# Patient Record
Sex: Male | Born: 1945 | Race: Black or African American | Hispanic: No | Marital: Married | Smoking: Current every day smoker
Health system: Southern US, Community
[De-identification: ages and names within clinical notes are randomized; demographics above are authoritative.]

## PROBLEM LIST (undated history)

## (undated) DIAGNOSIS — F419 Anxiety disorder, unspecified: Secondary | ICD-10-CM

## (undated) DIAGNOSIS — C9110 Chronic lymphocytic leukemia of B-cell type not having achieved remission: Secondary | ICD-10-CM

## (undated) DIAGNOSIS — I10 Essential (primary) hypertension: Secondary | ICD-10-CM

## (undated) DIAGNOSIS — J449 Chronic obstructive pulmonary disease, unspecified: Secondary | ICD-10-CM

## (undated) HISTORY — DX: Anxiety disorder, unspecified: F41.9

## (undated) HISTORY — DX: Chronic obstructive pulmonary disease, unspecified: J44.9

## (undated) HISTORY — DX: Essential (primary) hypertension: I10

## (undated) HISTORY — PX: HERNIA REPAIR: SHX51

---

## 2004-08-29 ENCOUNTER — Ambulatory Visit: Payer: Self-pay | Admitting: Internal Medicine

## 2006-04-30 ENCOUNTER — Ambulatory Visit: Payer: Self-pay | Admitting: General Surgery

## 2008-05-27 ENCOUNTER — Emergency Department: Payer: Self-pay | Admitting: Emergency Medicine

## 2009-07-15 ENCOUNTER — Ambulatory Visit: Payer: Self-pay | Admitting: Oncology

## 2009-08-01 ENCOUNTER — Ambulatory Visit: Payer: Self-pay | Admitting: Oncology

## 2009-08-15 ENCOUNTER — Ambulatory Visit: Payer: Self-pay | Admitting: Oncology

## 2009-09-12 ENCOUNTER — Ambulatory Visit: Payer: Self-pay | Admitting: Oncology

## 2009-10-13 ENCOUNTER — Ambulatory Visit: Payer: Self-pay | Admitting: Oncology

## 2009-10-25 ENCOUNTER — Ambulatory Visit: Payer: Self-pay | Admitting: Oncology

## 2009-11-09 ENCOUNTER — Ambulatory Visit: Payer: Self-pay | Admitting: Oncology

## 2009-11-12 ENCOUNTER — Ambulatory Visit: Payer: Self-pay | Admitting: Oncology

## 2010-01-12 ENCOUNTER — Ambulatory Visit: Payer: Self-pay | Admitting: Oncology

## 2010-01-31 ENCOUNTER — Ambulatory Visit: Payer: Self-pay | Admitting: Oncology

## 2010-02-12 ENCOUNTER — Ambulatory Visit: Payer: Self-pay | Admitting: Oncology

## 2010-05-02 ENCOUNTER — Ambulatory Visit: Payer: Self-pay | Admitting: Oncology

## 2010-05-15 ENCOUNTER — Ambulatory Visit: Payer: Self-pay | Admitting: Oncology

## 2010-08-01 ENCOUNTER — Ambulatory Visit: Payer: Self-pay | Admitting: Oncology

## 2010-08-15 ENCOUNTER — Ambulatory Visit: Payer: Self-pay | Admitting: Oncology

## 2010-10-31 ENCOUNTER — Ambulatory Visit: Payer: Self-pay | Admitting: Oncology

## 2010-11-13 ENCOUNTER — Ambulatory Visit: Payer: Self-pay | Admitting: Oncology

## 2010-12-14 ENCOUNTER — Ambulatory Visit: Payer: Self-pay | Admitting: Oncology

## 2011-01-30 ENCOUNTER — Ambulatory Visit: Payer: Self-pay | Admitting: Oncology

## 2011-02-13 ENCOUNTER — Ambulatory Visit: Payer: Self-pay | Admitting: Oncology

## 2011-05-21 ENCOUNTER — Ambulatory Visit: Payer: Self-pay | Admitting: Oncology

## 2011-06-15 ENCOUNTER — Ambulatory Visit: Payer: Self-pay | Admitting: Oncology

## 2011-08-20 ENCOUNTER — Ambulatory Visit: Payer: Self-pay | Admitting: Oncology

## 2011-08-20 LAB — CBC CANCER CENTER
Basophil #: 0.9 x10 3/mm — ABNORMAL HIGH (ref 0.0–0.1)
Eosinophil #: 0.3 x10 3/mm (ref 0.0–0.7)
HCT: 37.6 % — ABNORMAL LOW (ref 40.0–52.0)
HGB: 12.6 g/dL — ABNORMAL LOW (ref 13.0–18.0)
Lymphocyte #: 68.5 x10 3/mm — ABNORMAL HIGH (ref 1.0–3.6)
Lymphocyte %: 77.7 %
Lymphocytes: 84 %
MCHC: 33.6 g/dL (ref 32.0–36.0)
MCV: 90 fL (ref 80–100)
Monocyte %: 8.7 %
Monocytes: 4 %
Neutrophil #: 10.7 x10 3/mm — ABNORMAL HIGH (ref 1.4–6.5)
Neutrophil %: 12.2 %
Platelet: 126 x10 3/mm — ABNORMAL LOW (ref 150–440)
RBC: 4.18 10*6/uL — ABNORMAL LOW (ref 4.40–5.90)
Segmented Neutrophils: 12 %

## 2011-09-13 ENCOUNTER — Ambulatory Visit: Payer: Self-pay | Admitting: Oncology

## 2011-11-25 ENCOUNTER — Ambulatory Visit: Payer: Self-pay | Admitting: Oncology

## 2011-11-25 LAB — CBC CANCER CENTER
Basophil #: 0.2 x10 3/mm — ABNORMAL HIGH (ref 0.0–0.1)
Basophil %: 0.2 %
Eosinophil %: 0.3 %
MCH: 29.3 pg (ref 26.0–34.0)
MCHC: 32.2 g/dL (ref 32.0–36.0)
Monocyte #: 1.8 x10 3/mm — ABNORMAL HIGH (ref 0.2–1.0)
Neutrophil #: 8.8 x10 3/mm — ABNORMAL HIGH (ref 1.4–6.5)
Platelet: 98 x10 3/mm — ABNORMAL LOW (ref 150–440)
RBC: 4.36 10*6/uL — ABNORMAL LOW (ref 4.40–5.90)
WBC: 79 x10 3/mm — ABNORMAL HIGH (ref 3.8–10.6)

## 2011-12-14 ENCOUNTER — Ambulatory Visit: Payer: Self-pay | Admitting: Oncology

## 2012-01-06 LAB — CBC CANCER CENTER
Basophil #: 0.4 x10 3/mm — ABNORMAL HIGH (ref 0.0–0.1)
Basophil %: 0.5 %
Eosinophil #: 0.2 x10 3/mm (ref 0.0–0.7)
Eosinophil %: 0.4 %
HCT: 37.7 % — ABNORMAL LOW (ref 40.0–52.0)
HGB: 12.2 g/dL — ABNORMAL LOW (ref 13.0–18.0)
Lymphocyte #: 57.2 x10 3/mm — ABNORMAL HIGH (ref 1.0–3.6)
Lymphocyte %: 84.9 %
MCH: 29.2 pg (ref 26.0–34.0)
MCHC: 32.2 g/dL (ref 32.0–36.0)
MCV: 91 fL (ref 80–100)
Monocyte #: 1.4 x10 3/mm — ABNORMAL HIGH (ref 0.2–1.0)
Monocyte %: 2.1 %
Neutrophil #: 8.1 x10 3/mm — ABNORMAL HIGH (ref 1.4–6.5)
Neutrophil %: 12.1 %
Platelet: 101 x10 3/mm — ABNORMAL LOW (ref 150–440)
RBC: 4.15 10*6/uL — ABNORMAL LOW (ref 4.40–5.90)
RDW: 15 % — ABNORMAL HIGH (ref 11.5–14.5)
WBC: 67.3 x10 3/mm — ABNORMAL HIGH (ref 3.8–10.6)

## 2012-01-13 ENCOUNTER — Ambulatory Visit: Payer: Self-pay | Admitting: Oncology

## 2012-02-17 ENCOUNTER — Ambulatory Visit: Payer: Self-pay | Admitting: Oncology

## 2012-02-17 LAB — CBC CANCER CENTER
Basophil #: 0.2 x10 3/mm — ABNORMAL HIGH (ref 0.0–0.1)
Eosinophil #: 0.2 x10 3/mm (ref 0.0–0.7)
HCT: 38.7 % — ABNORMAL LOW (ref 40.0–52.0)
HGB: 12.8 g/dL — ABNORMAL LOW (ref 13.0–18.0)
Lymphocyte %: 88.4 %
MCH: 30.3 pg (ref 26.0–34.0)
MCHC: 33.1 g/dL (ref 32.0–36.0)
Monocyte #: 1.6 x10 3/mm — ABNORMAL HIGH (ref 0.2–1.0)
Neutrophil #: 7.7 x10 3/mm — ABNORMAL HIGH (ref 1.4–6.5)
Neutrophil %: 9.2 %
RDW: 15.1 % — ABNORMAL HIGH (ref 11.5–14.5)

## 2012-03-15 ENCOUNTER — Ambulatory Visit: Payer: Self-pay | Admitting: Oncology

## 2012-03-30 LAB — CBC CANCER CENTER
Basophil #: 0.4 x10 3/mm — ABNORMAL HIGH (ref 0.0–0.1)
Basophil %: 0.4 %
Eosinophil #: 0.2 x10 3/mm (ref 0.0–0.7)
Eosinophil %: 0.3 %
HCT: 37.3 % — ABNORMAL LOW (ref 40.0–52.0)
HGB: 11.9 g/dL — ABNORMAL LOW (ref 13.0–18.0)
Lymphocyte %: 88.1 %
Lymphs Abs: 74.8 x10 3/mm — ABNORMAL HIGH (ref 1.0–3.6)
MCH: 29.4 pg (ref 26.0–34.0)
MCHC: 31.9 g/dL — ABNORMAL LOW (ref 32.0–36.0)
MCV: 92 fL (ref 80–100)
Monocyte #: 1.1 x10 3/mm — ABNORMAL HIGH (ref 0.2–1.0)
Monocyte %: 1.3 %
Neutrophil #: 8.4 x10 3/mm — ABNORMAL HIGH (ref 1.4–6.5)
Neutrophil %: 9.9 %
Platelet: 96 x10 3/mm — ABNORMAL LOW (ref 150–440)
RBC: 4.06 x10 6/mm — ABNORMAL LOW (ref 4.40–5.90)
RDW: 14.8 % — ABNORMAL HIGH (ref 11.5–14.5)
WBC: 85 x10 3/mm — ABNORMAL HIGH (ref 3.8–10.6)

## 2012-04-07 LAB — CBC CANCER CENTER
Basophil #: 0.1 x10 3/mm (ref 0.0–0.1)
Basophil %: 0.2 %
HCT: 39.5 % — ABNORMAL LOW (ref 40.0–52.0)
HGB: 12.3 g/dL — ABNORMAL LOW (ref 13.0–18.0)
Lymphocyte %: 86.9 %
MCHC: 31.2 g/dL — ABNORMAL LOW (ref 32.0–36.0)
Monocyte %: 1.6 %
Neutrophil #: 8.8 x10 3/mm — ABNORMAL HIGH (ref 1.4–6.5)
Neutrophil %: 11 %
RBC: 4.21 10*6/uL — ABNORMAL LOW (ref 4.40–5.90)
RDW: 14.9 % — ABNORMAL HIGH (ref 11.5–14.5)
WBC: 79.7 x10 3/mm — ABNORMAL HIGH (ref 3.8–10.6)

## 2012-04-14 ENCOUNTER — Ambulatory Visit: Payer: Self-pay | Admitting: Oncology

## 2012-04-14 LAB — CBC CANCER CENTER
HGB: 12.7 g/dL — ABNORMAL LOW (ref 13.0–18.0)
Lymphocytes: 89 %
MCHC: 30.9 g/dL — ABNORMAL LOW (ref 32.0–36.0)
Monocytes: 2 %
Platelet: 110 x10 3/mm — ABNORMAL LOW (ref 150–440)
RDW: 15 % — ABNORMAL HIGH (ref 11.5–14.5)
Segmented Neutrophils: 9 %

## 2012-04-14 LAB — BASIC METABOLIC PANEL
Calcium, Total: 9.3 mg/dL (ref 8.5–10.1)
Co2: 26 mmol/L (ref 21–32)
Sodium: 142 mmol/L (ref 136–145)

## 2012-04-21 LAB — BASIC METABOLIC PANEL
Anion Gap: 11 (ref 7–16)
Calcium, Total: 9.5 mg/dL (ref 8.5–10.1)
Co2: 27 mmol/L (ref 21–32)
Creatinine: 1.04 mg/dL (ref 0.60–1.30)
EGFR (African American): >60
EGFR (Non-African Amer.): >60
Glucose: 76 mg/dL (ref 65–99)
Osmolality: 279 (ref 275–301)
Sodium: 141 mmol/L (ref 136–145)

## 2012-04-21 LAB — CBC CANCER CENTER
Basophil #: 0.1 x10 3/mm (ref 0.0–0.1)
Basophil %: 0.1 %
Eosinophil #: 0.2 x10 3/mm (ref 0.0–0.7)
HCT: 41.8 % (ref 40.0–52.0)
HGB: 13.3 g/dL (ref 13.0–18.0)
Lymphocyte %: 78.2 %
MCH: 29.1 pg (ref 26.0–34.0)
MCHC: 31.7 g/dL — ABNORMAL LOW (ref 32.0–36.0)
Monocyte %: 2.5 %
Neutrophil %: 18.9 %
RDW: 15 % — ABNORMAL HIGH (ref 11.5–14.5)

## 2012-04-28 LAB — BASIC METABOLIC PANEL
Anion Gap: 12 (ref 7–16)
BUN: 10 mg/dL (ref 7–18)
Creatinine: 1.04 mg/dL (ref 0.60–1.30)
EGFR (Non-African Amer.): >60
Glucose: 78 mg/dL (ref 65–99)
Sodium: 141 mmol/L (ref 136–145)

## 2012-04-28 LAB — CBC CANCER CENTER
Eosinophil %: 0.7 %
HCT: 42.5 % (ref 40.0–52.0)
HGB: 13.7 g/dL (ref 13.0–18.0)
Lymphocyte #: 23.4 x10 3/mm — ABNORMAL HIGH (ref 1.0–3.6)
MCHC: 32.2 g/dL (ref 32.0–36.0)
Monocyte %: 2.4 %
Neutrophil #: 8.8 x10 3/mm — ABNORMAL HIGH (ref 1.4–6.5)
Neutrophil %: 26.3 %
Platelet: 148 x10 3/mm — ABNORMAL LOW (ref 150–440)
RDW: 14.8 % — ABNORMAL HIGH (ref 11.5–14.5)
WBC: 33.3 x10 3/mm — ABNORMAL HIGH (ref 3.8–10.6)

## 2012-05-15 ENCOUNTER — Ambulatory Visit: Payer: Self-pay | Admitting: Oncology

## 2012-06-23 ENCOUNTER — Ambulatory Visit: Payer: Self-pay | Admitting: Oncology

## 2012-06-23 LAB — CBC CANCER CENTER
Basophil %: 0.3 %
Eosinophil %: 0.8 %
MCHC: 33.6 g/dL (ref 32.0–36.0)
MCV: 89 fL (ref 80–100)
Monocyte #: 0.6 x10 3/mm (ref 0.2–1.0)
Monocyte %: 2.8 %
Neutrophil #: 6.8 x10 3/mm — ABNORMAL HIGH (ref 1.4–6.5)
Neutrophil %: 32.9 %
Platelet: 135 x10 3/mm — ABNORMAL LOW (ref 150–440)
RBC: 4.48 10*6/uL (ref 4.40–5.90)
RDW: 15.4 % — ABNORMAL HIGH (ref 11.5–14.5)
WBC: 20.6 x10 3/mm — ABNORMAL HIGH (ref 3.8–10.6)

## 2012-06-23 LAB — BASIC METABOLIC PANEL
Anion Gap: 10 (ref 7–16)
BUN: 10 mg/dL (ref 7–18)
Calcium, Total: 9 mg/dL (ref 8.5–10.1)
Creatinine: 1.02 mg/dL (ref 0.60–1.30)
EGFR (African American): >60
EGFR (Non-African Amer.): >60
Glucose: 87 mg/dL (ref 65–99)
Osmolality: 283 (ref 275–301)

## 2012-07-15 ENCOUNTER — Ambulatory Visit: Payer: Self-pay | Admitting: Oncology

## 2012-08-10 LAB — CBC CANCER CENTER
Basophil %: 0.2 %
HCT: 41 % (ref 40.0–52.0)
HGB: 13.8 g/dL (ref 13.0–18.0)
MCHC: 33.7 g/dL (ref 32.0–36.0)
MCV: 86 fL (ref 80–100)
Monocyte #: 0.8 x10 3/mm (ref 0.2–1.0)
Neutrophil #: 3.6 x10 3/mm (ref 1.4–6.5)
Neutrophil %: 22.8 %
Platelet: 112 x10 3/mm — ABNORMAL LOW (ref 150–440)
RBC: 4.77 10*6/uL (ref 4.40–5.90)

## 2012-08-15 ENCOUNTER — Ambulatory Visit: Payer: Self-pay | Admitting: Oncology

## 2012-09-11 LAB — CBC CANCER CENTER
Basophil %: 0.2 %
Eosinophil #: 0.2 x10 3/mm (ref 0.0–0.7)
Eosinophil %: 0.6 %
HCT: 42.1 % (ref 40.0–52.0)
HGB: 14.1 g/dL (ref 13.0–18.0)
Lymphocyte #: 27.6 x10 3/mm — ABNORMAL HIGH (ref 1.0–3.6)
Lymphocyte %: 77.6 %
MCH: 29.1 pg (ref 26.0–34.0)
MCHC: 33.4 g/dL (ref 32.0–36.0)
Monocyte %: 1.8 %
Neutrophil %: 19.8 %
Platelet: 90 x10 3/mm — ABNORMAL LOW (ref 150–440)
RBC: 4.83 10*6/uL (ref 4.40–5.90)
RDW: 15.9 % — ABNORMAL HIGH (ref 11.5–14.5)
WBC: 35.5 x10 3/mm — ABNORMAL HIGH (ref 3.8–10.6)

## 2012-09-12 ENCOUNTER — Ambulatory Visit: Payer: Self-pay | Admitting: Oncology

## 2012-09-25 LAB — CBC CANCER CENTER
Basophil %: 0.3 %
Eosinophil #: 0.2 x10 3/mm (ref 0.0–0.7)
Lymphocyte #: 27.5 x10 3/mm — ABNORMAL HIGH (ref 1.0–3.6)
MCH: 29.4 pg (ref 26.0–34.0)
MCHC: 34 g/dL (ref 32.0–36.0)
MCV: 87 fL (ref 80–100)
Monocyte %: 2.2 %
Neutrophil #: 6.4 x10 3/mm (ref 1.4–6.5)
Neutrophil %: 18.3 %
RBC: 4.5 10*6/uL (ref 4.40–5.90)

## 2012-10-13 ENCOUNTER — Ambulatory Visit: Payer: Self-pay | Admitting: Oncology

## 2012-10-16 LAB — CBC CANCER CENTER
Basophil %: 0.3 %
Eosinophil %: 0.7 %
HCT: 42.2 % (ref 40.0–52.0)
HGB: 13.8 g/dL (ref 13.0–18.0)
Lymphocyte #: 34.1 x10 3/mm — ABNORMAL HIGH (ref 1.0–3.6)
Lymphocyte %: 77.4 %
MCHC: 32.8 g/dL (ref 32.0–36.0)
MCV: 88 fL (ref 80–100)
Monocyte #: 0.8 x10 3/mm (ref 0.2–1.0)
Neutrophil #: 8.7 x10 3/mm — ABNORMAL HIGH (ref 1.4–6.5)
Neutrophil %: 19.8 %
Platelet: 118 x10 3/mm — ABNORMAL LOW (ref 150–440)
RBC: 4.81 10*6/uL (ref 4.40–5.90)
RDW: 15.9 % — ABNORMAL HIGH (ref 11.5–14.5)
WBC: 44 x10 3/mm — ABNORMAL HIGH (ref 3.8–10.6)

## 2012-11-06 LAB — CBC CANCER CENTER
Basophil #: 0.1 x10 3/mm (ref 0.0–0.1)
Basophil %: 0.2 %
Eosinophil #: 0.3 x10 3/mm (ref 0.0–0.7)
Eosinophil %: 0.6 %
HCT: 39.7 % — ABNORMAL LOW (ref 40.0–52.0)
HGB: 13.1 g/dL (ref 13.0–18.0)
Lymphocyte #: 41.9 x10 3/mm — ABNORMAL HIGH (ref 1.0–3.6)
Lymphocyte %: 79.6 %
MCH: 29.5 pg (ref 26.0–34.0)
MCHC: 33.1 g/dL (ref 32.0–36.0)
MCV: 89 fL (ref 80–100)
Monocyte #: 1.2 x10 3/mm — ABNORMAL HIGH (ref 0.2–1.0)
Monocyte %: 2.3 %
Neutrophil #: 9.1 x10 3/mm — ABNORMAL HIGH (ref 1.4–6.5)
Neutrophil %: 17.3 %
Platelet: 98 x10 3/mm — ABNORMAL LOW (ref 150–440)
RBC: 4.46 10*6/uL (ref 4.40–5.90)
WBC: 52.6 x10 3/mm — ABNORMAL HIGH (ref 3.8–10.6)

## 2012-11-12 ENCOUNTER — Ambulatory Visit: Payer: Self-pay | Admitting: Oncology

## 2012-12-04 LAB — CBC CANCER CENTER
Basophil %: 0.1 %
Eosinophil #: 0.2 x10 3/mm (ref 0.0–0.7)
Eosinophil %: 0.3 %
HCT: 37.1 % — ABNORMAL LOW (ref 40.0–52.0)
HGB: 12.5 g/dL — ABNORMAL LOW (ref 13.0–18.0)
MCH: 30.1 pg (ref 26.0–34.0)
MCHC: 33.6 g/dL (ref 32.0–36.0)
Monocyte %: 2.2 %
Neutrophil #: 8.6 x10 3/mm — ABNORMAL HIGH (ref 1.4–6.5)
Neutrophil %: 15.6 %
Platelet: 87 x10 3/mm — ABNORMAL LOW (ref 150–440)
RDW: 15.9 % — ABNORMAL HIGH (ref 11.5–14.5)
WBC: 55.4 x10 3/mm — ABNORMAL HIGH (ref 3.8–10.6)

## 2012-12-13 ENCOUNTER — Ambulatory Visit: Payer: Self-pay | Admitting: Oncology

## 2012-12-13 ENCOUNTER — Ambulatory Visit: Payer: Self-pay | Admitting: Family Medicine

## 2013-01-01 LAB — CBC CANCER CENTER
Basophil %: 0.2 %
Eosinophil #: 0.3 x10 3/mm (ref 0.0–0.7)
HCT: 36.1 % — ABNORMAL LOW (ref 40.0–52.0)
HGB: 12.3 g/dL — ABNORMAL LOW (ref 13.0–18.0)
Lymphocyte #: 45.4 x10 3/mm — ABNORMAL HIGH (ref 1.0–3.6)
MCH: 30.8 pg (ref 26.0–34.0)
MCHC: 34.1 g/dL (ref 32.0–36.0)
Monocyte #: 1.1 x10 3/mm — ABNORMAL HIGH (ref 0.2–1.0)
Monocyte %: 2.1 %
Neutrophil #: 6.5 x10 3/mm (ref 1.4–6.5)
Neutrophil %: 12.2 %
RBC: 3.99 10*6/uL — ABNORMAL LOW (ref 4.40–5.90)
WBC: 53.4 x10 3/mm — ABNORMAL HIGH (ref 3.8–10.6)

## 2013-01-12 ENCOUNTER — Ambulatory Visit: Payer: Self-pay | Admitting: Oncology

## 2013-01-29 LAB — CBC CANCER CENTER
Basophil #: 0.1 x10 3/mm (ref 0.0–0.1)
Basophil %: 0.1 %
Eosinophil #: 0.1 x10 3/mm (ref 0.0–0.7)
Eosinophil %: 0.2 %
HCT: 35.7 % — ABNORMAL LOW (ref 40.0–52.0)
Lymphocyte #: 48.4 x10 3/mm — ABNORMAL HIGH (ref 1.0–3.6)
MCH: 31.2 pg (ref 26.0–34.0)
MCHC: 34 g/dL (ref 32.0–36.0)
MCV: 92 fL (ref 80–100)
Monocyte #: 1 x10 3/mm (ref 0.2–1.0)
Monocyte %: 1.8 %
Neutrophil #: 7 x10 3/mm — ABNORMAL HIGH (ref 1.4–6.5)
Platelet: 84 x10 3/mm — ABNORMAL LOW (ref 150–440)
RBC: 3.89 10*6/uL — ABNORMAL LOW (ref 4.40–5.90)
WBC: 56.6 x10 3/mm — ABNORMAL HIGH (ref 3.8–10.6)

## 2013-02-12 ENCOUNTER — Ambulatory Visit: Payer: Self-pay | Admitting: Oncology

## 2013-03-15 ENCOUNTER — Ambulatory Visit: Payer: Self-pay | Admitting: Oncology

## 2013-04-02 LAB — CBC CANCER CENTER
Basophil %: 0.3 %
HCT: 37.9 % — ABNORMAL LOW (ref 40.0–52.0)
HGB: 12.5 g/dL — ABNORMAL LOW (ref 13.0–18.0)
Lymphocyte #: 58.9 x10 3/mm — ABNORMAL HIGH (ref 1.0–3.6)
Lymphocyte %: 84.9 %
MCV: 93 fL (ref 80–100)
Neutrophil #: 8.4 x10 3/mm — ABNORMAL HIGH (ref 1.4–6.5)
Neutrophil %: 12.1 %
Platelet: 99 x10 3/mm — ABNORMAL LOW (ref 150–440)

## 2013-04-14 ENCOUNTER — Ambulatory Visit: Payer: Self-pay | Admitting: Oncology

## 2013-06-04 ENCOUNTER — Ambulatory Visit: Payer: Self-pay | Admitting: Oncology

## 2013-06-04 LAB — CBC CANCER CENTER
Basophil #: 0.3 x10 3/mm — ABNORMAL HIGH (ref 0.0–0.1)
Basophil %: 0.3 %
Eosinophil %: 0.2 %
HCT: 36.7 % — ABNORMAL LOW (ref 40.0–52.0)
HGB: 11.6 g/dL — ABNORMAL LOW (ref 13.0–18.0)
Lymphocyte #: 91.4 x10 3/mm — ABNORMAL HIGH (ref 1.0–3.6)
Lymphocyte %: 90.6 %
MCHC: 31.6 g/dL — ABNORMAL LOW (ref 32.0–36.0)
MCV: 95 fL (ref 80–100)
Monocyte #: 1.6 x10 3/mm — ABNORMAL HIGH (ref 0.2–1.0)
Neutrophil #: 7.3 x10 3/mm — ABNORMAL HIGH (ref 1.4–6.5)
RBC: 3.87 10*6/uL — ABNORMAL LOW (ref 4.40–5.90)
RDW: 15.4 % — ABNORMAL HIGH (ref 11.5–14.5)

## 2013-06-14 ENCOUNTER — Ambulatory Visit: Payer: Self-pay | Admitting: Oncology

## 2013-06-29 LAB — CBC CANCER CENTER
HCT: 31.6 % — ABNORMAL LOW (ref 40.0–52.0)
Lymphocytes: 96 %
MCH: 30.6 pg (ref 26.0–34.0)
MCHC: 32.5 g/dL (ref 32.0–36.0)
MCV: 94 fL (ref 80–100)
RDW: 15.2 % — ABNORMAL HIGH (ref 11.5–14.5)
Segmented Neutrophils: 2 %
WBC: 183.7 x10 3/mm — ABNORMAL HIGH (ref 3.8–10.6)

## 2013-07-13 LAB — CBC CANCER CENTER
HCT: 29.1 % — ABNORMAL LOW (ref 40.0–52.0)
HGB: 9.4 g/dL — ABNORMAL LOW (ref 13.0–18.0)
Monocytes: 1 %
Platelet: 123 x10 3/mm — ABNORMAL LOW (ref 150–440)
RBC: 3.13 10*6/uL — ABNORMAL LOW (ref 4.40–5.90)
RDW: 15 % — ABNORMAL HIGH (ref 11.5–14.5)
Segmented Neutrophils: 7 %
Variant Lymphocyte: 2 %

## 2013-07-15 ENCOUNTER — Ambulatory Visit: Payer: Self-pay | Admitting: Oncology

## 2013-07-27 LAB — CBC CANCER CENTER
Comment - H1-Com3: NORMAL
HCT: 32.3 % — ABNORMAL LOW (ref 40.0–52.0)
HGB: 10 g/dL — AB (ref 13.0–18.0)
Lymphocytes: 92 %
MCH: 28.2 pg (ref 26.0–34.0)
MCHC: 31 g/dL — ABNORMAL LOW (ref 32.0–36.0)
MCV: 91 fL (ref 80–100)
Monocytes: 2 %
PLATELETS: 158 x10 3/mm (ref 150–440)
RBC: 3.54 10*6/uL — AB (ref 4.40–5.90)
RDW: 15.3 % — ABNORMAL HIGH (ref 11.5–14.5)
Segmented Neutrophils: 6 %
WBC: 217 x10 3/mm — ABNORMAL HIGH (ref 3.8–10.6)

## 2013-08-03 LAB — BASIC METABOLIC PANEL
Anion Gap: 9 (ref 7–16)
BUN: 10 mg/dL (ref 7–18)
CALCIUM: 8.7 mg/dL (ref 8.5–10.1)
CREATININE: 1.03 mg/dL (ref 0.60–1.30)
Chloride: 105 mmol/L (ref 98–107)
Co2: 28 mmol/L (ref 21–32)
EGFR (Non-African Amer.): >60
GLUCOSE: 95 mg/dL (ref 65–99)
OSMOLALITY: 282 (ref 275–301)
Potassium: 3.9 mmol/L (ref 3.5–5.1)
Sodium: 142 mmol/L (ref 136–145)

## 2013-08-03 LAB — CBC CANCER CENTER
HCT: 35.5 % — ABNORMAL LOW (ref 40.0–52.0)
HGB: 11.3 g/dL — AB (ref 13.0–18.0)
LYMPHS PCT: 84 %
MCH: 28.5 pg (ref 26.0–34.0)
MCHC: 31.8 g/dL — ABNORMAL LOW (ref 32.0–36.0)
MCV: 90 fL (ref 80–100)
MONOS PCT: 4 %
PLATELETS: 200 x10 3/mm (ref 150–440)
RBC: 3.95 10*6/uL — ABNORMAL LOW (ref 4.40–5.90)
RDW: 15.1 % — ABNORMAL HIGH (ref 11.5–14.5)
SEGMENTED NEUTROPHILS: 12 %
WBC: 163.2 x10 3/mm — ABNORMAL HIGH (ref 3.8–10.6)

## 2013-08-03 LAB — LACTATE DEHYDROGENASE: LDH: 185 U/L (ref 85–241)

## 2013-08-10 LAB — CBC CANCER CENTER
Basophil #: 0.1 x10 3/mm (ref 0.0–0.1)
Basophil %: 0.4 %
EOS ABS: 0.2 x10 3/mm (ref 0.0–0.7)
EOS PCT: 0.5 %
HCT: 37.2 % — ABNORMAL LOW (ref 40.0–52.0)
HGB: 11.8 g/dL — AB (ref 13.0–18.0)
LYMPHS ABS: 23 x10 3/mm — AB (ref 1.0–3.6)
LYMPHS PCT: 72 %
MCH: 28 pg (ref 26.0–34.0)
MCHC: 31.8 g/dL — AB (ref 32.0–36.0)
MCV: 88 fL (ref 80–100)
Monocyte #: 0.8 x10 3/mm (ref 0.2–1.0)
Monocyte %: 2.5 %
Neutrophil #: 7.9 x10 3/mm — ABNORMAL HIGH (ref 1.4–6.5)
Neutrophil %: 24.6 %
PLATELETS: 191 x10 3/mm (ref 150–440)
RBC: 4.23 10*6/uL — ABNORMAL LOW (ref 4.40–5.90)
RDW: 15.4 % — AB (ref 11.5–14.5)
WBC: 32 x10 3/mm — ABNORMAL HIGH (ref 3.8–10.6)

## 2013-08-10 LAB — LACTATE DEHYDROGENASE: LDH: 163 U/L (ref 85–241)

## 2013-08-15 ENCOUNTER — Ambulatory Visit: Payer: Self-pay | Admitting: Oncology

## 2013-08-17 LAB — CBC CANCER CENTER
BASOS ABS: 0.1 x10 3/mm (ref 0.0–0.1)
Basophil %: 0.9 %
EOS ABS: 0.1 x10 3/mm (ref 0.0–0.7)
EOS PCT: 0.9 %
HCT: 37.1 % — AB (ref 40.0–52.0)
HGB: 12.1 g/dL — AB (ref 13.0–18.0)
LYMPHS PCT: 49.8 %
Lymphocyte #: 7.7 x10 3/mm — ABNORMAL HIGH (ref 1.0–3.6)
MCH: 27.8 pg (ref 26.0–34.0)
MCHC: 32.5 g/dL (ref 32.0–36.0)
MCV: 86 fL (ref 80–100)
MONO ABS: 0.4 x10 3/mm (ref 0.2–1.0)
Monocyte %: 2.5 %
Neutrophil #: 7.1 x10 3/mm — ABNORMAL HIGH (ref 1.4–6.5)
Neutrophil %: 45.9 %
PLATELETS: 176 x10 3/mm (ref 150–440)
RBC: 4.33 10*6/uL — ABNORMAL LOW (ref 4.40–5.90)
RDW: 15.4 % — AB (ref 11.5–14.5)
WBC: 15.4 x10 3/mm — AB (ref 3.8–10.6)

## 2013-08-17 LAB — LACTATE DEHYDROGENASE: LDH: 135 U/L (ref 85–241)

## 2013-08-24 LAB — CBC CANCER CENTER
Basophil #: 0.1 x10 3/mm (ref 0.0–0.1)
Basophil %: 0.7 %
Eosinophil #: 0.2 x10 3/mm (ref 0.0–0.7)
Eosinophil %: 1.9 %
HCT: 38.5 % — ABNORMAL LOW (ref 40.0–52.0)
HGB: 12.5 g/dL — ABNORMAL LOW (ref 13.0–18.0)
Lymphocyte #: 4.2 x10 3/mm — ABNORMAL HIGH (ref 1.0–3.6)
Lymphocyte %: 34.4 %
MCH: 27.7 pg (ref 26.0–34.0)
MCHC: 32.4 g/dL (ref 32.0–36.0)
MCV: 86 fL (ref 80–100)
Monocyte #: 0.6 x10 3/mm (ref 0.2–1.0)
Monocyte %: 5 %
NEUTROS ABS: 7 x10 3/mm — AB (ref 1.4–6.5)
NEUTROS PCT: 58 %
PLATELETS: 167 x10 3/mm (ref 150–440)
RBC: 4.5 10*6/uL (ref 4.40–5.90)
RDW: 15.6 % — ABNORMAL HIGH (ref 11.5–14.5)
WBC: 12.1 x10 3/mm — ABNORMAL HIGH (ref 3.8–10.6)

## 2013-08-24 LAB — LACTATE DEHYDROGENASE: LDH: 141 U/L (ref 85–241)

## 2013-09-12 ENCOUNTER — Ambulatory Visit: Payer: Self-pay | Admitting: Oncology

## 2013-09-21 LAB — CBC CANCER CENTER
Basophil #: 0.1 x10 3/mm (ref 0.0–0.1)
Basophil %: 0.7 %
EOS ABS: 0.1 x10 3/mm (ref 0.0–0.7)
Eosinophil %: 1.3 %
HCT: 39.9 % — AB (ref 40.0–52.0)
HGB: 12.9 g/dL — ABNORMAL LOW (ref 13.0–18.0)
LYMPHS ABS: 4 x10 3/mm — AB (ref 1.0–3.6)
Lymphocyte %: 35 %
MCH: 26.9 pg (ref 26.0–34.0)
MCHC: 32.4 g/dL (ref 32.0–36.0)
MCV: 83 fL (ref 80–100)
MONO ABS: 0.6 x10 3/mm (ref 0.2–1.0)
MONOS PCT: 5.1 %
Neutrophil #: 6.5 x10 3/mm (ref 1.4–6.5)
Neutrophil %: 57.9 %
PLATELETS: 201 x10 3/mm (ref 150–440)
RBC: 4.8 10*6/uL (ref 4.40–5.90)
RDW: 16.3 % — ABNORMAL HIGH (ref 11.5–14.5)
WBC: 11.3 x10 3/mm — AB (ref 3.8–10.6)

## 2013-10-13 ENCOUNTER — Ambulatory Visit: Payer: Self-pay | Admitting: Oncology

## 2013-10-22 LAB — CBC CANCER CENTER
BASOS PCT: 0.6 %
Basophil #: 0.1 x10 3/mm (ref 0.0–0.1)
EOS ABS: 0.2 x10 3/mm (ref 0.0–0.7)
EOS PCT: 1.6 %
HCT: 40 % (ref 40.0–52.0)
HGB: 13 g/dL (ref 13.0–18.0)
LYMPHS ABS: 4.1 x10 3/mm — AB (ref 1.0–3.6)
LYMPHS PCT: 38.9 %
MCH: 27.1 pg (ref 26.0–34.0)
MCHC: 32.6 g/dL (ref 32.0–36.0)
MCV: 83 fL (ref 80–100)
Monocyte #: 0.6 x10 3/mm (ref 0.2–1.0)
Monocyte %: 6.1 %
NEUTROS PCT: 52.8 %
Neutrophil #: 5.6 x10 3/mm (ref 1.4–6.5)
Platelet: 186 x10 3/mm (ref 150–440)
RBC: 4.82 10*6/uL (ref 4.40–5.90)
RDW: 18.1 % — AB (ref 11.5–14.5)
WBC: 10.6 x10 3/mm (ref 3.8–10.6)

## 2013-11-12 ENCOUNTER — Ambulatory Visit: Payer: Self-pay | Admitting: Oncology

## 2013-11-23 LAB — BASIC METABOLIC PANEL
ANION GAP: 9 (ref 7–16)
BUN: 10 mg/dL (ref 7–18)
CREATININE: 0.94 mg/dL (ref 0.60–1.30)
Calcium, Total: 9.1 mg/dL (ref 8.5–10.1)
Chloride: 104 mmol/L (ref 98–107)
Co2: 29 mmol/L (ref 21–32)
EGFR (African American): >60
EGFR (Non-African Amer.): >60
Glucose: 110 mg/dL — ABNORMAL HIGH (ref 65–99)
OSMOLALITY: 283 (ref 275–301)
Potassium: 4.1 mmol/L (ref 3.5–5.1)
Sodium: 142 mmol/L (ref 136–145)

## 2013-11-23 LAB — CBC CANCER CENTER
BASOS ABS: 0.1 x10 3/mm (ref 0.0–0.1)
BASOS PCT: 0.5 %
EOS PCT: 0.8 %
Eosinophil #: 0.1 x10 3/mm (ref 0.0–0.7)
HCT: 40.9 % (ref 40.0–52.0)
HGB: 13.7 g/dL (ref 13.0–18.0)
LYMPHS PCT: 22.8 %
Lymphocyte #: 3.4 x10 3/mm (ref 1.0–3.6)
MCH: 28 pg (ref 26.0–34.0)
MCHC: 33.4 g/dL (ref 32.0–36.0)
MCV: 84 fL (ref 80–100)
MONO ABS: 0.9 x10 3/mm (ref 0.2–1.0)
MONOS PCT: 5.9 %
NEUTROS PCT: 70 %
Neutrophil #: 10.6 x10 3/mm — ABNORMAL HIGH (ref 1.4–6.5)
PLATELETS: 187 x10 3/mm (ref 150–440)
RBC: 4.89 10*6/uL (ref 4.40–5.90)
RDW: 17.7 % — ABNORMAL HIGH (ref 11.5–14.5)
WBC: 15.1 x10 3/mm — ABNORMAL HIGH (ref 3.8–10.6)

## 2013-11-23 LAB — LACTATE DEHYDROGENASE: LDH: 159 U/L (ref 85–241)

## 2013-12-02 LAB — COMPREHENSIVE METABOLIC PANEL
ALBUMIN: 4.1 g/dL (ref 3.4–5.0)
ALT: 17 U/L (ref 12–78)
Alkaline Phosphatase: 87 U/L
Anion Gap: 9 (ref 7–16)
BUN: 11 mg/dL (ref 7–18)
Bilirubin,Total: 0.4 mg/dL (ref 0.2–1.0)
CALCIUM: 8.8 mg/dL (ref 8.5–10.1)
Chloride: 107 mmol/L (ref 98–107)
Co2: 27 mmol/L (ref 21–32)
Creatinine: 1.01 mg/dL (ref 0.60–1.30)
EGFR (African American): >60
Glucose: 85 mg/dL (ref 65–99)
Osmolality: 284 (ref 275–301)
Potassium: 4.2 mmol/L (ref 3.5–5.1)
SGOT(AST): 13 U/L — ABNORMAL LOW (ref 15–37)
Sodium: 143 mmol/L (ref 136–145)
TOTAL PROTEIN: 7 g/dL (ref 6.4–8.2)

## 2013-12-02 LAB — CBC CANCER CENTER
BASOS ABS: 0.1 x10 3/mm (ref 0.0–0.1)
Basophil %: 0.6 %
Eosinophil #: 0.1 x10 3/mm (ref 0.0–0.7)
Eosinophil %: 0.6 %
HCT: 40.2 % (ref 40.0–52.0)
HGB: 13.7 g/dL (ref 13.0–18.0)
LYMPHS PCT: 53.1 %
Lymphocyte #: 5.6 x10 3/mm — ABNORMAL HIGH (ref 1.0–3.6)
MCH: 28.4 pg (ref 26.0–34.0)
MCHC: 34.1 g/dL (ref 32.0–36.0)
MCV: 83 fL (ref 80–100)
Monocyte #: 0.5 x10 3/mm (ref 0.2–1.0)
Monocyte %: 4.6 %
NEUTROS ABS: 4.3 x10 3/mm (ref 1.4–6.5)
NEUTROS PCT: 41.1 %
Platelet: 194 x10 3/mm (ref 150–440)
RBC: 4.83 10*6/uL (ref 4.40–5.90)
RDW: 17.6 % — ABNORMAL HIGH (ref 11.5–14.5)
WBC: 10.6 x10 3/mm (ref 3.8–10.6)

## 2013-12-02 LAB — LACTATE DEHYDROGENASE: LDH: 158 U/L (ref 85–241)

## 2013-12-02 LAB — MAGNESIUM: MAGNESIUM: 2.1 mg/dL

## 2013-12-09 LAB — CBC CANCER CENTER
Basophil #: 0.1 x10 3/mm (ref 0.0–0.1)
Basophil %: 0.8 %
Eosinophil #: 0.2 x10 3/mm (ref 0.0–0.7)
Eosinophil %: 2.2 %
HCT: 39.7 % — ABNORMAL LOW (ref 40.0–52.0)
HGB: 13.5 g/dL (ref 13.0–18.0)
LYMPHS ABS: 1.3 x10 3/mm (ref 1.0–3.6)
Lymphocyte %: 14.4 %
MCH: 28.8 pg (ref 26.0–34.0)
MCHC: 34 g/dL (ref 32.0–36.0)
MCV: 85 fL (ref 80–100)
MONO ABS: 0.8 x10 3/mm (ref 0.2–1.0)
MONOS PCT: 9 %
NEUTROS ABS: 6.6 x10 3/mm — AB (ref 1.4–6.5)
Neutrophil %: 73.6 %
Platelet: 211 x10 3/mm (ref 150–440)
RBC: 4.7 10*6/uL (ref 4.40–5.90)
RDW: 17.6 % — ABNORMAL HIGH (ref 11.5–14.5)
WBC: 9 x10 3/mm (ref 3.8–10.6)

## 2013-12-09 LAB — BASIC METABOLIC PANEL
ANION GAP: 8 (ref 7–16)
BUN: 14 mg/dL (ref 7–18)
Calcium, Total: 9 mg/dL (ref 8.5–10.1)
Chloride: 103 mmol/L (ref 98–107)
Co2: 29 mmol/L (ref 21–32)
Creatinine: 1.04 mg/dL (ref 0.60–1.30)
EGFR (African American): >60
EGFR (Non-African Amer.): >60
Glucose: 101 mg/dL — ABNORMAL HIGH (ref 65–99)
OSMOLALITY: 280 (ref 275–301)
POTASSIUM: 4.4 mmol/L (ref 3.5–5.1)
SODIUM: 140 mmol/L (ref 136–145)

## 2013-12-09 LAB — MAGNESIUM: Magnesium: 2.3 mg/dL

## 2013-12-09 LAB — PHOSPHORUS: PHOSPHORUS: 3.4 mg/dL (ref 2.5–4.9)

## 2013-12-13 ENCOUNTER — Ambulatory Visit: Payer: Self-pay | Admitting: Oncology

## 2013-12-23 LAB — COMPREHENSIVE METABOLIC PANEL
ALBUMIN: 4 g/dL (ref 3.4–5.0)
ALK PHOS: 81 U/L
ANION GAP: 7 (ref 7–16)
BUN: 12 mg/dL (ref 7–18)
Bilirubin,Total: 0.4 mg/dL (ref 0.2–1.0)
CHLORIDE: 105 mmol/L (ref 98–107)
CREATININE: 0.93 mg/dL (ref 0.60–1.30)
Calcium, Total: 9.7 mg/dL (ref 8.5–10.1)
Co2: 30 mmol/L (ref 21–32)
EGFR (African American): >60
EGFR (Non-African Amer.): >60
Glucose: 89 mg/dL (ref 65–99)
OSMOLALITY: 282 (ref 275–301)
Potassium: 4.8 mmol/L (ref 3.5–5.1)
SGOT(AST): 9 U/L — ABNORMAL LOW (ref 15–37)
SGPT (ALT): 16 U/L (ref 12–78)
Sodium: 142 mmol/L (ref 136–145)
Total Protein: 7.4 g/dL (ref 6.4–8.2)

## 2013-12-23 LAB — CBC CANCER CENTER
Basophil #: 0.1 x10 3/mm (ref 0.0–0.1)
Basophil %: 0.8 %
Eosinophil #: 0.2 x10 3/mm (ref 0.0–0.7)
Eosinophil %: 2.3 %
HCT: 40.2 % (ref 40.0–52.0)
HGB: 13.5 g/dL (ref 13.0–18.0)
LYMPHS PCT: 16.2 %
Lymphocyte #: 1.3 x10 3/mm (ref 1.0–3.6)
MCH: 28.7 pg (ref 26.0–34.0)
MCHC: 33.5 g/dL (ref 32.0–36.0)
MCV: 86 fL (ref 80–100)
MONO ABS: 0.7 x10 3/mm (ref 0.2–1.0)
MONOS PCT: 9 %
Neutrophil #: 5.6 x10 3/mm (ref 1.4–6.5)
Neutrophil %: 71.7 %
PLATELETS: 229 x10 3/mm (ref 150–440)
RBC: 4.7 10*6/uL (ref 4.40–5.90)
RDW: 16.3 % — AB (ref 11.5–14.5)
WBC: 7.8 x10 3/mm (ref 3.8–10.6)

## 2013-12-23 LAB — MAGNESIUM: Magnesium: 2.1 mg/dL

## 2014-01-06 ENCOUNTER — Ambulatory Visit: Payer: Self-pay | Admitting: Physician Assistant

## 2014-01-12 ENCOUNTER — Ambulatory Visit: Payer: Self-pay | Admitting: Oncology

## 2014-01-20 LAB — COMPREHENSIVE METABOLIC PANEL
AST: 20 U/L (ref 15–37)
Albumin: 3.4 g/dL (ref 3.4–5.0)
Alkaline Phosphatase: 94 U/L
Anion Gap: 7 (ref 7–16)
BUN: 8 mg/dL (ref 7–18)
Bilirubin,Total: 0.2 mg/dL (ref 0.2–1.0)
CHLORIDE: 104 mmol/L (ref 98–107)
CREATININE: 0.97 mg/dL (ref 0.60–1.30)
Calcium, Total: 9.2 mg/dL (ref 8.5–10.1)
Co2: 28 mmol/L (ref 21–32)
EGFR (African American): >60
Glucose: 88 mg/dL (ref 65–99)
Osmolality: 275 (ref 275–301)
Potassium: 4.5 mmol/L (ref 3.5–5.1)
SGPT (ALT): 15 U/L (ref 12–78)
Sodium: 139 mmol/L (ref 136–145)
Total Protein: 7.1 g/dL (ref 6.4–8.2)

## 2014-01-20 LAB — CBC CANCER CENTER
BASOS ABS: 0.1 x10 3/mm (ref 0.0–0.1)
BASOS PCT: 1.2 %
Eosinophil #: 0.2 x10 3/mm (ref 0.0–0.7)
Eosinophil %: 3 %
HCT: 35.6 % — ABNORMAL LOW (ref 40.0–52.0)
HGB: 11.9 g/dL — AB (ref 13.0–18.0)
LYMPHS PCT: 15.1 %
Lymphocyte #: 1.2 x10 3/mm (ref 1.0–3.6)
MCH: 29.2 pg (ref 26.0–34.0)
MCHC: 33.5 g/dL (ref 32.0–36.0)
MCV: 87 fL (ref 80–100)
MONO ABS: 0.6 x10 3/mm (ref 0.2–1.0)
Monocyte %: 7.6 %
NEUTROS ABS: 5.6 x10 3/mm (ref 1.4–6.5)
Neutrophil %: 73.1 %
PLATELETS: 357 x10 3/mm (ref 150–440)
RBC: 4.09 10*6/uL — ABNORMAL LOW (ref 4.40–5.90)
RDW: 15.9 % — ABNORMAL HIGH (ref 11.5–14.5)
WBC: 7.7 x10 3/mm (ref 3.8–10.6)

## 2014-01-20 LAB — MAGNESIUM: Magnesium: 2.3 mg/dL

## 2014-01-31 ENCOUNTER — Ambulatory Visit: Payer: Self-pay

## 2014-02-10 LAB — CBC CANCER CENTER
Basophil #: 0.1 x10 3/mm (ref 0.0–0.1)
Basophil %: 1.9 %
Eosinophil #: 0.4 x10 3/mm (ref 0.0–0.7)
Eosinophil %: 5.4 %
HCT: 39.9 % — ABNORMAL LOW (ref 40.0–52.0)
HGB: 13 g/dL (ref 13.0–18.0)
LYMPHS PCT: 14.7 %
Lymphocyte #: 1 x10 3/mm (ref 1.0–3.6)
MCH: 28.7 pg (ref 26.0–34.0)
MCHC: 32.6 g/dL (ref 32.0–36.0)
MCV: 88 fL (ref 80–100)
MONO ABS: 0.7 x10 3/mm (ref 0.2–1.0)
Monocyte %: 10.5 %
Neutrophil #: 4.7 x10 3/mm (ref 1.4–6.5)
Neutrophil %: 67.5 %
Platelet: 173 x10 3/mm (ref 150–440)
RBC: 4.53 10*6/uL (ref 4.40–5.90)
RDW: 17.3 % — ABNORMAL HIGH (ref 11.5–14.5)
WBC: 7 x10 3/mm (ref 3.8–10.6)

## 2014-02-10 LAB — COMPREHENSIVE METABOLIC PANEL
ALT: 17 U/L
ANION GAP: 6 — AB (ref 7–16)
Albumin: 3.7 g/dL (ref 3.4–5.0)
Alkaline Phosphatase: 73 U/L
BUN: 8 mg/dL (ref 7–18)
Bilirubin,Total: 0.4 mg/dL (ref 0.2–1.0)
CALCIUM: 9 mg/dL (ref 8.5–10.1)
Chloride: 109 mmol/L — ABNORMAL HIGH (ref 98–107)
Co2: 27 mmol/L (ref 21–32)
Creatinine: 0.96 mg/dL (ref 0.60–1.30)
EGFR (African American): >60
EGFR (Non-African Amer.): >60
GLUCOSE: 79 mg/dL (ref 65–99)
Osmolality: 280 (ref 275–301)
Potassium: 4.1 mmol/L (ref 3.5–5.1)
SGOT(AST): 13 U/L — ABNORMAL LOW (ref 15–37)
Sodium: 142 mmol/L (ref 136–145)
Total Protein: 7 g/dL (ref 6.4–8.2)

## 2014-02-10 LAB — MAGNESIUM: Magnesium: 2.1 mg/dL

## 2014-02-12 ENCOUNTER — Ambulatory Visit: Payer: Self-pay | Admitting: Oncology

## 2014-03-03 LAB — CBC CANCER CENTER
BASOS ABS: 0.1 x10 3/mm (ref 0.0–0.1)
Basophil %: 2.2 %
EOS PCT: 7.2 %
Eosinophil #: 0.4 x10 3/mm (ref 0.0–0.7)
HCT: 42.7 % (ref 40.0–52.0)
HGB: 14.1 g/dL (ref 13.0–18.0)
Lymphocyte #: 0.7 x10 3/mm — ABNORMAL LOW (ref 1.0–3.6)
Lymphocyte %: 11.3 %
MCH: 28.6 pg (ref 26.0–34.0)
MCHC: 33.1 g/dL (ref 32.0–36.0)
MCV: 87 fL (ref 80–100)
MONOS PCT: 10.5 %
Monocyte #: 0.6 x10 3/mm (ref 0.2–1.0)
NEUTROS ABS: 4 x10 3/mm (ref 1.4–6.5)
Neutrophil %: 68.8 %
Platelet: 172 x10 3/mm (ref 150–440)
RBC: 4.93 10*6/uL (ref 4.40–5.90)
RDW: 17.3 % — ABNORMAL HIGH (ref 11.5–14.5)
WBC: 5.9 x10 3/mm (ref 3.8–10.6)

## 2014-03-03 LAB — COMPREHENSIVE METABOLIC PANEL
ALBUMIN: 3.8 g/dL (ref 3.4–5.0)
ALK PHOS: 78 U/L
ALT: 19 U/L
AST: 16 U/L (ref 15–37)
Anion Gap: 8 (ref 7–16)
BUN: 9 mg/dL (ref 7–18)
Bilirubin,Total: 0.3 mg/dL (ref 0.2–1.0)
CHLORIDE: 106 mmol/L (ref 98–107)
CO2: 28 mmol/L (ref 21–32)
CREATININE: 0.96 mg/dL (ref 0.60–1.30)
Calcium, Total: 8.9 mg/dL (ref 8.5–10.1)
EGFR (Non-African Amer.): >60
GLUCOSE: 91 mg/dL (ref 65–99)
Osmolality: 281 (ref 275–301)
POTASSIUM: 4.3 mmol/L (ref 3.5–5.1)
SODIUM: 142 mmol/L (ref 136–145)
TOTAL PROTEIN: 7 g/dL (ref 6.4–8.2)

## 2014-03-15 ENCOUNTER — Ambulatory Visit: Payer: Self-pay | Admitting: Oncology

## 2014-03-24 LAB — COMPREHENSIVE METABOLIC PANEL
Albumin: 3.7 g/dL (ref 3.4–5.0)
Alkaline Phosphatase: 77 U/L
Anion Gap: 9 (ref 7–16)
BUN: 8 mg/dL (ref 7–18)
Bilirubin,Total: 0.3 mg/dL (ref 0.2–1.0)
CALCIUM: 8.9 mg/dL (ref 8.5–10.1)
CO2: 28 mmol/L (ref 21–32)
CREATININE: 1.13 mg/dL (ref 0.60–1.30)
Chloride: 104 mmol/L (ref 98–107)
EGFR (African American): >60
EGFR (Non-African Amer.): >60
GLUCOSE: 98 mg/dL (ref 65–99)
Osmolality: 280 (ref 275–301)
POTASSIUM: 4.2 mmol/L (ref 3.5–5.1)
SGOT(AST): 17 U/L (ref 15–37)
SGPT (ALT): 18 U/L
Sodium: 141 mmol/L (ref 136–145)
Total Protein: 6.9 g/dL (ref 6.4–8.2)

## 2014-03-24 LAB — CBC CANCER CENTER
Basophil #: 0.1 x10 3/mm (ref 0.0–0.1)
Basophil %: 2 %
EOS ABS: 0.4 x10 3/mm (ref 0.0–0.7)
Eosinophil %: 7.9 %
HCT: 42.7 % (ref 40.0–52.0)
HGB: 14.1 g/dL (ref 13.0–18.0)
LYMPHS ABS: 0.7 x10 3/mm — AB (ref 1.0–3.6)
LYMPHS PCT: 13.3 %
MCH: 28.6 pg (ref 26.0–34.0)
MCHC: 33.1 g/dL (ref 32.0–36.0)
MCV: 87 fL (ref 80–100)
MONO ABS: 0.6 x10 3/mm (ref 0.2–1.0)
Monocyte %: 11 %
Neutrophil #: 3.7 x10 3/mm (ref 1.4–6.5)
Neutrophil %: 65.8 %
Platelet: 199 x10 3/mm (ref 150–440)
RBC: 4.94 10*6/uL (ref 4.40–5.90)
RDW: 17.5 % — ABNORMAL HIGH (ref 11.5–14.5)
WBC: 5.7 x10 3/mm (ref 3.8–10.6)

## 2014-04-14 ENCOUNTER — Ambulatory Visit: Payer: Self-pay | Admitting: Oncology

## 2014-04-14 LAB — CBC CANCER CENTER
Basophil #: 0.2 x10 3/mm — ABNORMAL HIGH (ref 0.0–0.1)
Basophil %: 2.3 %
Eosinophil #: 0.6 x10 3/mm (ref 0.0–0.7)
Eosinophil %: 7.8 %
HCT: 40.3 % (ref 40.0–52.0)
HGB: 13.8 g/dL (ref 13.0–18.0)
LYMPHS PCT: 9.2 %
Lymphocyte #: 0.7 x10 3/mm — ABNORMAL LOW (ref 1.0–3.6)
MCH: 29.1 pg (ref 26.0–34.0)
MCHC: 34.2 g/dL (ref 32.0–36.0)
MCV: 85 fL (ref 80–100)
MONOS PCT: 10.6 %
Monocyte #: 0.8 x10 3/mm (ref 0.2–1.0)
NEUTROS ABS: 5.1 x10 3/mm (ref 1.4–6.5)
Neutrophil %: 70.1 %
PLATELETS: 201 x10 3/mm (ref 150–440)
RBC: 4.73 10*6/uL (ref 4.40–5.90)
RDW: 16.4 % — ABNORMAL HIGH (ref 11.5–14.5)
WBC: 7.2 x10 3/mm (ref 3.8–10.6)

## 2014-04-14 LAB — COMPREHENSIVE METABOLIC PANEL
ALK PHOS: 77 U/L
AST: 15 U/L (ref 15–37)
Albumin: 3.9 g/dL (ref 3.4–5.0)
Anion Gap: 12 (ref 7–16)
BUN: 13 mg/dL (ref 7–18)
Bilirubin,Total: 0.3 mg/dL (ref 0.2–1.0)
CALCIUM: 9.4 mg/dL (ref 8.5–10.1)
Chloride: 103 mmol/L (ref 98–107)
Co2: 25 mmol/L (ref 21–32)
Creatinine: 1.12 mg/dL (ref 0.60–1.30)
EGFR (Non-African Amer.): >60
GLUCOSE: 100 mg/dL — AB (ref 65–99)
Osmolality: 280 (ref 275–301)
Potassium: 4 mmol/L (ref 3.5–5.1)
SGPT (ALT): 19 U/L
SODIUM: 140 mmol/L (ref 136–145)
Total Protein: 6.7 g/dL (ref 6.4–8.2)

## 2014-05-15 ENCOUNTER — Ambulatory Visit: Payer: Self-pay | Admitting: Oncology

## 2014-05-19 LAB — CBC CANCER CENTER
BASOS PCT: 1.4 %
Basophil #: 0.1 x10 3/mm (ref 0.0–0.1)
EOS PCT: 4.2 %
Eosinophil #: 0.3 x10 3/mm (ref 0.0–0.7)
HCT: 42.5 % (ref 40.0–52.0)
HGB: 14.2 g/dL (ref 13.0–18.0)
Lymphocyte #: 0.8 x10 3/mm — ABNORMAL LOW (ref 1.0–3.6)
Lymphocyte %: 9.9 %
MCH: 29.1 pg (ref 26.0–34.0)
MCHC: 33.3 g/dL (ref 32.0–36.0)
MCV: 87 fL (ref 80–100)
Monocyte #: 0.6 x10 3/mm (ref 0.2–1.0)
Monocyte %: 7.5 %
NEUTROS PCT: 77 %
Neutrophil #: 6.1 x10 3/mm (ref 1.4–6.5)
Platelet: 212 x10 3/mm (ref 150–440)
RBC: 4.86 10*6/uL (ref 4.40–5.90)
RDW: 15.9 % — AB (ref 11.5–14.5)
WBC: 7.9 x10 3/mm (ref 3.8–10.6)

## 2014-05-19 LAB — BASIC METABOLIC PANEL
ANION GAP: 10 (ref 7–16)
BUN: 11 mg/dL (ref 7–18)
CALCIUM: 9.5 mg/dL (ref 8.5–10.1)
CO2: 28 mmol/L (ref 21–32)
Chloride: 104 mmol/L (ref 98–107)
Creatinine: 1.03 mg/dL (ref 0.60–1.30)
EGFR (Non-African Amer.): >60
Glucose: 96 mg/dL (ref 65–99)
Osmolality: 282 (ref 275–301)
POTASSIUM: 4.3 mmol/L (ref 3.5–5.1)
Sodium: 142 mmol/L (ref 136–145)

## 2014-05-19 LAB — LACTATE DEHYDROGENASE: LDH: 142 U/L (ref 85–241)

## 2014-06-14 ENCOUNTER — Ambulatory Visit: Payer: Self-pay | Admitting: Oncology

## 2014-08-23 ENCOUNTER — Ambulatory Visit: Payer: Self-pay | Admitting: Oncology

## 2014-08-25 ENCOUNTER — Ambulatory Visit: Payer: Self-pay | Admitting: Oncology

## 2014-09-13 ENCOUNTER — Ambulatory Visit
Admit: 2014-09-13 | Disposition: A | Discharge status: Home / Self Care | Payer: Self-pay | Attending: Oncology | Admitting: Oncology

## 2014-11-23 ENCOUNTER — Other Ambulatory Visit: Payer: Self-pay | Admitting: *Deleted

## 2014-11-23 DIAGNOSIS — C9110 Chronic lymphocytic leukemia of B-cell type not having achieved remission: Secondary | ICD-10-CM

## 2014-11-24 ENCOUNTER — Encounter (INDEPENDENT_AMBULATORY_CARE_PROVIDER_SITE_OTHER): Payer: Self-pay

## 2014-11-24 ENCOUNTER — Inpatient Hospital Stay: Payer: BLUE CROSS/BLUE SHIELD | Attending: Oncology

## 2014-11-24 DIAGNOSIS — C9110 Chronic lymphocytic leukemia of B-cell type not having achieved remission: Secondary | ICD-10-CM | POA: Diagnosis not present

## 2014-11-24 LAB — CBC WITH DIFFERENTIAL/PLATELET
BASOS ABS: 0.1 10*3/uL (ref 0–0.1)
BASOS PCT: 2 %
EOS ABS: 0.2 10*3/uL (ref 0–0.7)
Eosinophils Relative: 3 %
HCT: 40.5 % (ref 40.0–52.0)
Hemoglobin: 13.7 g/dL (ref 13.0–18.0)
Lymphocytes Relative: 18 %
Lymphs Abs: 0.9 10*3/uL — ABNORMAL LOW (ref 1.0–3.6)
MCH: 30 pg (ref 26.0–34.0)
MCHC: 33.7 g/dL (ref 32.0–36.0)
MCV: 89 fL (ref 80.0–100.0)
Monocytes Absolute: 0.4 10*3/uL (ref 0.2–1.0)
Monocytes Relative: 7 %
NEUTROS PCT: 70 %
Neutro Abs: 3.7 10*3/uL (ref 1.4–6.5)
PLATELETS: 200 10*3/uL (ref 150–440)
RBC: 4.55 MIL/uL (ref 4.40–5.90)
RDW: 15.5 % — ABNORMAL HIGH (ref 11.5–14.5)
WBC: 5.3 10*3/uL (ref 3.8–10.6)

## 2015-02-22 ENCOUNTER — Other Ambulatory Visit: Payer: Self-pay | Admitting: *Deleted

## 2015-02-22 DIAGNOSIS — C9110 Chronic lymphocytic leukemia of B-cell type not having achieved remission: Secondary | ICD-10-CM

## 2015-02-23 ENCOUNTER — Encounter: Payer: Self-pay | Admitting: Oncology

## 2015-02-23 ENCOUNTER — Inpatient Hospital Stay: Payer: Managed Care, Other (non HMO) | Attending: Oncology | Admitting: Oncology

## 2015-02-23 ENCOUNTER — Inpatient Hospital Stay (HOSPITAL_BASED_OUTPATIENT_CLINIC_OR_DEPARTMENT_OTHER): Payer: Managed Care, Other (non HMO)

## 2015-02-23 VITALS — BP 148/55 | HR 73 | Temp 97.7°F | Resp 18 | Wt 130.3 lb

## 2015-02-23 DIAGNOSIS — F419 Anxiety disorder, unspecified: Secondary | ICD-10-CM | POA: Diagnosis not present

## 2015-02-23 DIAGNOSIS — Z87891 Personal history of nicotine dependence: Secondary | ICD-10-CM

## 2015-02-23 DIAGNOSIS — I10 Essential (primary) hypertension: Secondary | ICD-10-CM | POA: Diagnosis not present

## 2015-02-23 DIAGNOSIS — N2889 Other specified disorders of kidney and ureter: Secondary | ICD-10-CM

## 2015-02-23 DIAGNOSIS — C9110 Chronic lymphocytic leukemia of B-cell type not having achieved remission: Secondary | ICD-10-CM | POA: Diagnosis not present

## 2015-02-23 DIAGNOSIS — Z79899 Other long term (current) drug therapy: Secondary | ICD-10-CM | POA: Diagnosis not present

## 2015-02-23 LAB — CBC WITH DIFFERENTIAL/PLATELET
Basophils Absolute: 0.1 10*3/uL (ref 0–0.1)
Basophils Relative: 1 %
EOS ABS: 0.2 10*3/uL (ref 0–0.7)
Eosinophils Relative: 3 %
HCT: 38.6 % — ABNORMAL LOW (ref 40.0–52.0)
Hemoglobin: 13.2 g/dL (ref 13.0–18.0)
LYMPHS ABS: 1.1 10*3/uL (ref 1.0–3.6)
Lymphocytes Relative: 16 %
MCH: 30.4 pg (ref 26.0–34.0)
MCHC: 34.2 g/dL (ref 32.0–36.0)
MCV: 88.8 fL (ref 80.0–100.0)
MONOS PCT: 10 %
Monocytes Absolute: 0.6 10*3/uL (ref 0.2–1.0)
NEUTROS PCT: 70 %
Neutro Abs: 4.8 10*3/uL (ref 1.4–6.5)
Platelets: 201 10*3/uL (ref 150–440)
RBC: 4.34 MIL/uL — ABNORMAL LOW (ref 4.40–5.90)
RDW: 14.6 % — ABNORMAL HIGH (ref 11.5–14.5)
WBC: 6.8 10*3/uL (ref 3.8–10.6)

## 2015-03-02 NOTE — Progress Notes (Signed)
[place]  [phone] [phone]  ID: Julian Medina OB: 02-Dec-1945  MR#: [id number]  [id number]  Patient Care Team: Guadalupe Maple, MD as PCP - General (Family Medicine)  CHIEF COMPLAINT:  Chief Complaint  Patient presents with  . Follow-up    CLL     INTERVAL HISTORY: Patient returns to clinic today for further evaluation and laboratory work.  He continues to feel well and is asymptomatic. He continues to be highly anxious. He denies any recent fevers. He has no neurologic complaints. He denies any pain. He has no chest pain or shortness of breath. He denies any nausea, vomiting, constipation, or diarrhea. He has no urinary complaints. Patient offers no specific complaints today.   REVIEW OF SYSTEMS:   Review of Systems  Constitutional: Negative for fever, weight loss and malaise/fatigue.  Respiratory: Negative.   Cardiovascular: Negative.   Genitourinary: Negative.   Neurological: Negative for weakness.  Psychiatric/Behavioral: The patient is nervous/anxious.     As per HPI. Otherwise, a complete review of systems is negatve.  PAST MEDICAL HISTORY: Past Medical History  Diagnosis Date  . Hypertension   . Anxiety     PAST SURGICAL HISTORY: Past Surgical History  Procedure Laterality Date  . Hernia repair      FAMILY HISTORY: Heart disease and hypertension.     ADVANCED DIRECTIVES:    HEALTH MAINTENANCE: Social History  Substance Use Topics  . Smoking status: Former Smoker -- 1.50 packs/day for 40 years  . Smokeless tobacco: Never Used  . Alcohol Use: Yes     Comment: occasional     Colonoscopy:  PAP:  Bone density:  Lipid panel:  Allergies  Allergen Reactions  . Aspirin     Current Outpatient Prescriptions  Medication Sig Dispense Refill  . ADVAIR DISKUS 250-50 MCG/DOSE AEPB INHALE 1 PUFF 2 TIMES A DAY  6  . amLODipine (NORVASC) 10 MG tablet Take 10 mg by mouth daily.  6  . benazepril (LOTENSIN)  40 MG tablet 1 TABLET ONCE EACH DAY ORAL  6   No current facility-administered medications for this visit.    OBJECTIVE: Filed Vitals:   02/23/15 1042  BP: 148/55  Pulse: 73  Temp: 97.7 F (36.5 C)  Resp: 18     There is no height on file to calculate BMI.    ECOG FS:0 - Asymptomatic  General: Well-developed, well-nourished, no acute distress. Eyes: Pink conjunctiva, anicteric sclera. Lungs: Clear to auscultation bilaterally. Heart: Regular rate and rhythm. No rubs, murmurs, or gallops. Abdomen: Soft, nontender, nondistended. No organomegaly noted, normoactive bowel sounds. Musculoskeletal: No edema, cyanosis, or clubbing. Neuro: Alert, answering all questions appropriately. Cranial nerves grossly intact. Skin: No rashes or petechiae noted. Psych: Normal affect.   LAB RESULTS:  Lab Results  Component Value Date   NA 142 05/19/2014   K 4.3 05/19/2014   CL 104 05/19/2014   CO2 28 05/19/2014   GLUCOSE 96 05/19/2014   BUN 11 05/19/2014   CREATININE 1.03 05/19/2014   CALCIUM 9.5 05/19/2014   PROT 6.7 04/14/2014   ALBUMIN 3.9 04/14/2014   AST 15 04/14/2014   ALT 19 04/14/2014   ALKPHOS 77 04/14/2014   BILITOT 0.3 04/14/2014   GFRNONAA >60 05/19/2014   GFRAA >60 05/19/2014    Lab Results  Component Value Date   WBC 6.8 02/23/2015   NEUTROABS 4.8 02/23/2015   HGB 13.2 02/23/2015   HCT 38.6* 02/23/2015   MCV 88.8 02/23/2015   PLT 201  02/23/2015     STUDIES: No results found.  ASSESSMENT: CLL, Rai stage 0, suspicious left kidney lesion.   PLAN:    1.  CLL: Flow cytometry confirmed the diagnosis.  Patient is also ZAP-70 positive this which indicates it may be a more aggressive form of CLL.  CT scan of neck, chest, abdomen, and pelvis with no further evidence of disease. No further treatment is planned at this time, but patient will likely need retreatment again in the future. He last received treatment with Rituxan and Treanda in September 2015. Return to  clinic in 3 months with repeat laboratory work and then in 6 months with repeat laboratory work and further evaluation.  2.  Anxiety: Continue Xanax PRN. 3.  Renal lesion: MRI results reviewed independently with highly suspicious lesion in his left kidney. Patient has seen urology and has elected for observation rather than robotic-assisted partial nephrectomy. Will repeat MRI in 1 to 2 weeks to assess interval change.   Patient expressed understanding and was in agreement with this plan. He also understands that He can call clinic at any time with any questions, concerns, or complaints.     Lloyd Huger, MD   03/02/2015 10:03 AM

## 2015-03-04 ENCOUNTER — Other Ambulatory Visit: Payer: Self-pay | Admitting: Family Medicine

## 2015-03-07 ENCOUNTER — Other Ambulatory Visit: Payer: Self-pay | Admitting: *Deleted

## 2015-03-07 DIAGNOSIS — C9110 Chronic lymphocytic leukemia of B-cell type not having achieved remission: Secondary | ICD-10-CM

## 2015-03-07 MED ORDER — ALPRAZOLAM 0.5 MG PO TABS: 0.5000 mg | ORAL_TABLET | Freq: Once | ORAL | Status: DC

## 2015-03-11 ENCOUNTER — Other Ambulatory Visit: Payer: Self-pay | Admitting: Family Medicine

## 2015-03-14 ENCOUNTER — Ambulatory Visit
Admission: RE | Admit: 2015-03-14 | Discharge: 2015-03-14 | Disposition: A | Discharge status: Home / Self Care | Payer: Managed Care, Other (non HMO) | Source: Ambulatory Visit | Attending: Oncology | Admitting: Oncology

## 2015-03-14 DIAGNOSIS — N2889 Other specified disorders of kidney and ureter: Secondary | ICD-10-CM | POA: Diagnosis not present

## 2015-03-14 DIAGNOSIS — N281 Cyst of kidney, acquired: Secondary | ICD-10-CM | POA: Insufficient documentation

## 2015-03-14 MED ORDER — GADOBENATE DIMEGLUMINE 529 MG/ML IV SOLN
15.0000 mL | Freq: Once | INTRAVENOUS | Status: AC | PRN
  Administered 2015-03-14: 12 mL via INTRAVENOUS

## 2015-03-22 ENCOUNTER — Telehealth: Payer: Self-pay | Admitting: *Deleted

## 2015-03-22 NOTE — Telephone Encounter (Signed)
I spoke with patients family on the phone to inform them of MRI results, verbalized understanding of results.

## 2015-05-04 ENCOUNTER — Ambulatory Visit (INDEPENDENT_AMBULATORY_CARE_PROVIDER_SITE_OTHER): Payer: Managed Care, Other (non HMO) | Admitting: Family Medicine

## 2015-05-04 ENCOUNTER — Encounter: Payer: Self-pay | Admitting: Family Medicine

## 2015-05-04 VITALS — BP 135/83 | HR 76 | Temp 97.8°F | Ht 69.7 in | Wt 128.0 lb

## 2015-05-04 DIAGNOSIS — R42 Dizziness and giddiness: Secondary | ICD-10-CM | POA: Insufficient documentation

## 2015-05-04 DIAGNOSIS — J42 Unspecified chronic bronchitis: Secondary | ICD-10-CM

## 2015-05-04 DIAGNOSIS — I10 Essential (primary) hypertension: Secondary | ICD-10-CM | POA: Insufficient documentation

## 2015-05-04 DIAGNOSIS — J449 Chronic obstructive pulmonary disease, unspecified: Secondary | ICD-10-CM | POA: Insufficient documentation

## 2015-05-04 DIAGNOSIS — C9110 Chronic lymphocytic leukemia of B-cell type not having achieved remission: Secondary | ICD-10-CM | POA: Insufficient documentation

## 2015-05-04 DIAGNOSIS — N2889 Other specified disorders of kidney and ureter: Secondary | ICD-10-CM | POA: Diagnosis not present

## 2015-05-04 DIAGNOSIS — Z0000 Encounter for general adult medical examination without abnormal findings: Secondary | ICD-10-CM

## 2015-05-04 DIAGNOSIS — N400 Enlarged prostate without lower urinary tract symptoms: Secondary | ICD-10-CM

## 2015-05-04 DIAGNOSIS — IMO0002 Reserved for concepts with insufficient information to code with codable children: Secondary | ICD-10-CM

## 2015-05-04 DIAGNOSIS — H8110 Benign paroxysmal vertigo, unspecified ear: Secondary | ICD-10-CM

## 2015-05-04 LAB — URINALYSIS, ROUTINE W REFLEX MICROSCOPIC
BILIRUBIN UA: NEGATIVE
GLUCOSE, UA: NEGATIVE
KETONES UA: NEGATIVE
NITRITE UA: NEGATIVE
Protein, UA: NEGATIVE
UUROB: 0.2 mg/dL (ref 0.2–1.0)
pH, UA: 5.5 (ref 5.0–7.5)

## 2015-05-04 LAB — MICROSCOPIC EXAMINATION
BACTERIA UA: NONE SEEN
CAST TYPE: NONE SEEN
CRYSTAL TYPE: NONE SEEN
CRYSTALS: NONE SEEN
Casts: NONE SEEN /lpf
EPITHELIAL CELLS (NON RENAL): NONE SEEN /HPF (ref 0–10)
Mucus, UA: NONE SEEN
RBC, UA: NONE SEEN /hpf (ref 0–?)
Renal Epithel, UA: NONE SEEN /hpf
Trichomonas, UA: NONE SEEN
YEAST UA: NONE SEEN

## 2015-05-04 MED ORDER — BENAZEPRIL HCL 40 MG PO TABS: ORAL_TABLET | ORAL | Status: DC

## 2015-05-04 MED ORDER — AMLODIPINE BESYLATE 10 MG PO TABS: 10.0000 mg | ORAL_TABLET | Freq: Every day | ORAL | Status: DC

## 2015-05-04 MED ORDER — FLUTICASONE-SALMETEROL 250-50 MCG/DOSE IN AEPB: INHALATION_SPRAY | RESPIRATORY_TRACT | Status: DC

## 2015-05-04 NOTE — Assessment & Plan Note (Signed)
The current medical regimen is effective;  continue present plan and medications.  

## 2015-05-04 NOTE — Progress Notes (Signed)
BP 135/83 mmHg  Pulse 76  Temp(Src) 97.8 F (36.6 C)  Ht 5' 9.7" (1.77 m)  Wt 128 lb (58.06 kg)  BMI 18.53 kg/m2  SpO2 99%   Subjective:    Patient ID: Julian Medina, male    DOB: 1946-06-22, 69 y.o.   MRN: [id number]  HPI: Julian Medina is a 69 y.o. male  Chief Complaint  Patient presents with  . Annual Exam   Patient followed by hematology for chronic lymphocytic leukemia which is stable. Reviewed notes patient getting follow-up quarterly Patient also followed by urology for nonspecific lesion on kidney being followed every 6 months.  One of patient's PICC concerns today is positional vertigo every time he bends his head forward or leans forward he gets very dizzy. This resolves after a little bit no other real dizziness symptoms.  Breathing is doing well with Advair not having to use rescue inhaler at all.  Blood pressure doing well here no issues with medications  Has noted some occasional swelling in his right hand where he sounds like had infiltration of his IV during chemotherapy session. The swelling comes and goes.   Relevant past medical, surgical, family and social history reviewed and updated as indicated. Interim medical history since our last visit reviewed. Allergies and medications reviewed and updated.  Review of Systems  Constitutional: Negative.   HENT: Negative.   Eyes: Negative.   Respiratory: Negative.   Cardiovascular: Negative.   Gastrointestinal: Negative.   Endocrine: Negative.   Genitourinary: Negative.   Musculoskeletal: Negative.   Skin: Negative.   Allergic/Immunologic: Negative.   Neurological: Negative.   Hematological: Negative.   Psychiatric/Behavioral: Negative.     Per HPI unless specifically indicated above     Objective:    BP 135/83 mmHg  Pulse 76  Temp(Src) 97.8 F (36.6 C)  Ht 5' 9.7" (1.77 m)  Wt 128 lb (58.06 kg)  BMI 18.53 kg/m2  SpO2 99%  Wt Readings from Last 3 Encounters:  05/04/15 128 lb (58.06 kg)   01/12/14 121 lb (54.885 kg)  03/14/15 130 lb (58.968 kg)    Physical Exam  Constitutional: He is oriented to person, place, and time. He appears well-developed and well-nourished.  HENT:  Head: Normocephalic and atraumatic.  Right Ear: External ear normal.  Left Ear: External ear normal.  Eyes: Conjunctivae and EOM are normal. Pupils are equal, round, and reactive to light.  Neck: Normal range of motion. Neck supple.  Cardiovascular: Normal rate, regular rhythm, normal heart sounds and intact distal pulses.   Pulmonary/Chest: Effort normal and breath sounds normal.  Abdominal: Soft. Bowel sounds are normal. There is no splenomegaly or hepatomegaly.  Genitourinary: Rectum normal and penis normal.  Right-sided inguinal hernia Enlarged prostate  Musculoskeletal: Normal range of motion.  Neurological: He is alert and oriented to person, place, and time. He has normal reflexes.  Skin: No rash noted. No erythema.  Psychiatric: He has a normal mood and affect. His behavior is normal. Judgment and thought content normal.    Results for orders placed or performed in visit on 02/23/15  CBC with Differential/Platelet  Result Value Ref Range   WBC 6.8 3.8 - 10.6 K/uL   RBC 4.34 (L) 4.40 - 5.90 MIL/uL   Hemoglobin 13.2 13.0 - 18.0 g/dL   HCT 38.6 (L) 40.0 - 52.0 %   MCV 88.8 80.0 - 100.0 fL   MCH 30.4 26.0 - 34.0 pg   MCHC 34.2 32.0 - 36.0 g/dL   RDW  14.6 (H) 11.5 - 14.5 %   Platelets 201 150 - 440 K/uL   Neutrophils Relative % 70 %   Neutro Abs 4.8 1.4 - 6.5 K/uL   Lymphocytes Relative 16 %   Lymphs Abs 1.1 1.0 - 3.6 K/uL   Monocytes Relative 10 %   Monocytes Absolute 0.6 0.2 - 1.0 K/uL   Eosinophils Relative 3 %   Eosinophils Absolute 0.2 0 - 0.7 K/uL   Basophils Relative 1 %   Basophils Absolute 0.1 0 - 0.1 K/uL      Assessment & Plan:   Problem List Items Addressed This Visit      Cardiovascular and Mediastinum   Essential hypertension - Primary    The current medical  regimen is effective;  continue present plan and medications.       Relevant Medications   amLODipine (NORVASC) 10 MG tablet   benazepril (LOTENSIN) 40 MG tablet   Other Relevant Orders   Comprehensive metabolic panel   Lipid panel   TSH   Urinalysis, Routine w reflex microscopic (not at [facility])     Respiratory   COPD (chronic obstructive pulmonary disease) ([place])    The current medical regimen is effective;  continue present plan and medications.       Relevant Medications   Fluticasone-Salmeterol (ADVAIR DISKUS) 250-50 MCG/DOSE AEPB   Other Relevant Orders   Comprehensive metabolic panel   Lipid panel   TSH   Urinalysis, Routine w reflex microscopic (not at [facility])     Genitourinary   BPH (benign prostatic hyperplasia)   Relevant Orders   Urinalysis, Routine w reflex microscopic (not at [facility])   PSA     Other   Chronic lymphocytic leukemia ([place])    Stable followed by hematology      Relevant Orders   Comprehensive metabolic panel   Lipid panel   TSH   Urinalysis, Routine w reflex microscopic (not at [facility])   Renal mass of unknown nature    Followed by urology stable      Relevant Orders   Comprehensive metabolic panel   Lipid panel   TSH   Urinalysis, Routine w reflex microscopic (not at [facility])   Positional vertigo    Will refer to ENT for further evaluation and treatment.      Relevant Orders   Lipid panel   TSH   Urinalysis, Routine w reflex microscopic (not at [facility])   Ambulatory referral to ENT    Other Visit Diagnoses    PE (physical exam), annual           discussed flu shot patient adamantly refuses. Follow up plan: Return in about 6 months (around 11/02/2015), or if symptoms worsen or fail to improve, for BMP htn recheck.

## 2015-05-04 NOTE — Addendum Note (Signed)
Addended byGolden Pop on: 05/04/2015 09:47 AM   Modules accepted: Miquel Dunn

## 2015-05-04 NOTE — Assessment & Plan Note (Signed)
Followed by urology stable

## 2015-05-04 NOTE — Assessment & Plan Note (Signed)
Will refer to ENT for further evaluation and treatment.

## 2015-05-04 NOTE — Assessment & Plan Note (Signed)
Stable followed by hematology 

## 2015-05-05 LAB — TSH: TSH: 1.17 u[IU]/mL (ref 0.450–4.500)

## 2015-05-05 LAB — LIPID PANEL
CHOL/HDL RATIO: 2.5 ratio (ref 0.0–5.0)
Cholesterol, Total: 283 mg/dL — ABNORMAL HIGH (ref 100–199)
HDL: 115 mg/dL (ref >39)
LDL Calculated: 151 mg/dL — ABNORMAL HIGH (ref 0–99)
Triglycerides: 83 mg/dL (ref 0–149)
VLDL Cholesterol Cal: 17 mg/dL (ref 5–40)

## 2015-05-05 LAB — COMPREHENSIVE METABOLIC PANEL
A/G RATIO: 2.7 — AB (ref 1.1–2.5)
ALT: 13 IU/L (ref 0–44)
AST: 19 IU/L (ref 0–40)
Albumin: 4.9 g/dL — ABNORMAL HIGH (ref 3.6–4.8)
Alkaline Phosphatase: 66 IU/L (ref 39–117)
BILIRUBIN TOTAL: 0.5 mg/dL (ref 0.0–1.2)
BUN/Creatinine Ratio: 11 (ref 10–22)
BUN: 14 mg/dL (ref 8–27)
CALCIUM: 9.9 mg/dL (ref 8.6–10.2)
CHLORIDE: 99 mmol/L (ref 97–106)
CO2: 23 mmol/L (ref 18–29)
Creatinine, Ser: 1.23 mg/dL (ref 0.76–1.27)
GFR, EST AFRICAN AMERICAN: 69 mL/min/{1.73_m2} (ref >59)
GFR, EST NON AFRICAN AMERICAN: 60 mL/min/{1.73_m2} (ref >59)
GLOBULIN, TOTAL: 1.8 g/dL (ref 1.5–4.5)
Glucose: 92 mg/dL (ref 65–99)
POTASSIUM: 5 mmol/L (ref 3.5–5.2)
SODIUM: 139 mmol/L (ref 136–144)
TOTAL PROTEIN: 6.7 g/dL (ref 6.0–8.5)

## 2015-05-05 LAB — PSA: Prostate Specific Ag, Serum: 1.5 ng/mL (ref 0.0–4.0)

## 2015-05-05 LAB — PLEASE NOTE

## 2015-05-08 ENCOUNTER — Encounter: Payer: Self-pay | Admitting: Family Medicine

## 2015-05-25 ENCOUNTER — Other Ambulatory Visit: Payer: Managed Care, Other (non HMO)

## 2015-05-25 ENCOUNTER — Inpatient Hospital Stay: Payer: Managed Care, Other (non HMO) | Attending: Oncology

## 2015-07-21 ENCOUNTER — Ambulatory Visit (INDEPENDENT_AMBULATORY_CARE_PROVIDER_SITE_OTHER): Payer: Managed Care, Other (non HMO) | Admitting: Family Medicine

## 2015-07-21 ENCOUNTER — Encounter: Payer: Self-pay | Admitting: Family Medicine

## 2015-07-21 VITALS — BP 149/83 | HR 93 | Temp 98.1°F | Ht 69.5 in | Wt 135.0 lb

## 2015-07-21 DIAGNOSIS — J42 Unspecified chronic bronchitis: Secondary | ICD-10-CM

## 2015-07-21 DIAGNOSIS — J441 Chronic obstructive pulmonary disease with (acute) exacerbation: Secondary | ICD-10-CM | POA: Diagnosis not present

## 2015-07-21 MED ORDER — BENZONATATE 200 MG PO CAPS: 200.0000 mg | ORAL_CAPSULE | Freq: Three times a day (TID) | ORAL | Status: DC | PRN

## 2015-07-21 MED ORDER — AZITHROMYCIN 250 MG PO TABS: ORAL_TABLET | ORAL | Status: DC

## 2015-07-21 MED ORDER — ALBUTEROL SULFATE (2.5 MG/3ML) 0.083% IN NEBU: 2.5000 mg | INHALATION_SOLUTION | Freq: Once | RESPIRATORY_TRACT | Status: DC

## 2015-07-21 MED ORDER — PREDNISONE 10 MG PO TABS: ORAL_TABLET | ORAL | Status: DC

## 2015-07-21 MED ORDER — HYDROCOD POLST-CPM POLST ER 10-8 MG/5ML PO SUER: 5.0000 mL | Freq: Every evening | ORAL | Status: DC | PRN

## 2015-07-21 NOTE — Progress Notes (Signed)
BP 149/83 mmHg  Pulse 93  Temp(Src) 98.1 F (36.7 C)  Ht 5' 9.5" (1.765 m)  Wt 135 lb (61.236 kg)  BMI 19.66 kg/m2  SpO2 97%   Subjective:    Patient ID: Julian Medina, male    DOB: 12/22/1945, 70 y.o.   MRN: [id number]  HPI: Julian Medina is a 70 y.o. male  Chief Complaint  Patient presents with  . Cough    patient states that he has been sick for two weeks with a bad cough and congestion   UPPER RESPIRATORY TRACT INFECTION Duration: 2 weeks Worst symptom: cough Fever: no Cough: yes Shortness of breath: yes Wheezing: yes Chest pain: yes, with cough Chest tightness: yes Chest congestion: yes Nasal congestion: no Runny nose: yes Post nasal drip: yes Sneezing: no Sore throat: no Swollen glands: no Sinus pressure: yes Headache: no Face pain: no Toothache: no Ear pain: no  Ear pressure: no  Eyes red/itching:no Eye drainage/crusting: no  Vomiting: no Rash: no Fatigue: yes Sick contacts: yes Strep contacts: no  Context: stable Recurrent sinusitis: no Relief with OTC cold/cough medications: no  Treatments attempted: mucinex and cough syrup  Relevant past medical, surgical, family and social history reviewed and updated as indicated. Interim medical history since our last visit reviewed. Allergies and medications reviewed and updated.  Review of Systems  Constitutional: Negative.   Respiratory: Positive for cough, chest tightness, shortness of breath and wheezing. Negative for apnea and stridor.   Cardiovascular: Negative.   Psychiatric/Behavioral: Negative.     Per HPI unless specifically indicated above     Objective:    BP 149/83 mmHg  Pulse 93  Temp(Src) 98.1 F (36.7 C)  Ht 5' 9.5" (1.765 m)  Wt 135 lb (61.236 kg)  BMI 19.66 kg/m2  SpO2 97%  Wt Readings from Last 3 Encounters:  07/21/15 135 lb (61.236 kg)  05/04/15 128 lb (58.06 kg)  01/12/14 121 lb (54.885 kg)    Physical Exam  Constitutional: He is oriented to person, place, and  time. He appears well-developed and well-nourished. No distress.  HENT:  Head: Normocephalic and atraumatic.  Right Ear: Hearing and external ear normal.  Left Ear: Hearing normal.  Nose: Nose normal.  Mouth/Throat: Oropharynx is clear and moist. No oropharyngeal exudate.  L ear lobe swollen  Eyes: Conjunctivae, EOM and lids are normal. Pupils are equal, round, and reactive to light. Right eye exhibits no discharge. Left eye exhibits no discharge. No scleral icterus.  Neck: Normal range of motion. Neck supple. No JVD present. No tracheal deviation present. No thyromegaly present.  Cardiovascular: Normal rate, regular rhythm, normal heart sounds and intact distal pulses.  Exam reveals no gallop and no friction rub.   No murmur heard. Pulmonary/Chest: Effort normal. No stridor. No respiratory distress. He has decreased breath sounds in the right upper field, the right middle field, the right lower field, the left upper field, the left middle field and the left lower field. He has wheezes in the right upper field, the right middle field, the right lower field, the left upper field, the left middle field and the left lower field. He has no rhonchi. He has no rales.  Musculoskeletal: Normal range of motion.  Lymphadenopathy:    He has no cervical adenopathy.  Neurological: He is alert and oriented to person, place, and time.  Skin: Skin is warm, dry and intact. No rash noted. No erythema. No pallor.  Psychiatric: He has a normal mood and affect. His speech  is normal and behavior is normal. Judgment and thought content normal. Cognition and memory are normal.  Nursing note and vitals reviewed.   Results for orders placed or performed in visit on 05/04/15  Microscopic Examination  Result Value Ref Range   WBC, UA 0-5 0 -  5 /hpf   RBC, UA None seen 0 -  2 /hpf   Epithelial Cells (non renal) None seen 0 - 10 /hpf   Renal Epithel, UA None seen None seen /hpf   Casts None seen None seen /lpf    Cast Type None seen N/A   Crystals None seen N/A   Crystal Type None seen N/A   Mucus, UA None seen Not Estab.   Bacteria, UA None seen None seen/Few   Yeast, UA None seen None seen   Trichomonas, UA None seen None seen  Comprehensive metabolic panel  Result Value Ref Range   Glucose 92 65 - 99 mg/dL   BUN 14 8 - 27 mg/dL   Creatinine, Ser 1.23 0.76 - 1.27 mg/dL   GFR calc non Af Amer 60 >59 mL/min/1.73   GFR calc Af Amer 69 >59 mL/min/1.73   BUN/Creatinine Ratio 11 10 - 22   Sodium 139 136 - 144 mmol/L   Potassium 5.0 3.5 - 5.2 mmol/L   Chloride 99 97 - 106 mmol/L   CO2 23 18 - 29 mmol/L   Calcium 9.9 8.6 - 10.2 mg/dL   Total Protein 6.7 6.0 - 8.5 g/dL   Albumin 4.9 (H) 3.6 - 4.8 g/dL   Globulin, Total 1.8 1.5 - 4.5 g/dL   Albumin/Globulin Ratio 2.7 (H) 1.1 - 2.5   Bilirubin Total 0.5 0.0 - 1.2 mg/dL   Alkaline Phosphatase 66 39 - 117 IU/L   AST 19 0 - 40 IU/L   ALT 13 0 - 44 IU/L  Lipid panel  Result Value Ref Range   Cholesterol, Total 283 (H) 100 - 199 mg/dL   Triglycerides 83 0 - 149 mg/dL   HDL 115 >39 mg/dL   VLDL Cholesterol Cal 17 5 - 40 mg/dL   LDL Calculated 151 (H) 0 - 99 mg/dL   Chol/HDL Ratio 2.5 0.0 - 5.0 ratio units  TSH  Result Value Ref Range   TSH 1.170 0.450 - 4.500 uIU/mL  Urinalysis, Routine w reflex microscopic (not at [facility])  Result Value Ref Range   Specific Gravity, UA <1.005 (L) 1.005 - 1.030   pH, UA 5.5 5.0 - 7.5   Color, UA Yellow Yellow   Appearance Ur Clear Clear   Leukocytes, UA Trace (A) Negative   Protein, UA Negative Negative/Trace   Glucose, UA Negative Negative   Ketones, UA Negative Negative   RBC, UA Trace (A) Negative   Bilirubin, UA Negative Negative   Urobilinogen, Ur 0.2 0.2 - 1.0 mg/dL   Nitrite, UA Negative Negative   Microscopic Examination See below:   PSA  Result Value Ref Range   Prostate Specific Ag, Serum 1.5 0.0 - 4.0 ng/mL  Please Note  Result Value Ref Range   Please note Comment       Assessment &  Plan:   Problem List Items Addressed This Visit      Respiratory   COPD (chronic obstructive pulmonary disease) ([facility])   Relevant Medications   albuterol (PROVENTIL) (2.5 MG/3ML) 0.083% nebulizer solution 2.5 mg   predniSONE (DELTASONE) 10 MG tablet   chlorpheniramine-HYDROcodone (TUSSIONEX PENNKINETIC ER) 10-8 MG/5ML SUER   benzonatate (TESSALON) 200 MG capsule  azithromycin (ZITHROMAX) 250 MG tablet    Other Visit Diagnoses    COPD exacerbation ([place])    -  Primary    Will treat with 12 day taper of prednisone, z-pack, tussionex and tessalon perles, Recheck lungs in 2 weeks.     Relevant Medications    albuterol (PROVENTIL) (2.5 MG/3ML) 0.083% nebulizer solution 2.5 mg    predniSONE (DELTASONE) 10 MG tablet    chlorpheniramine-HYDROcodone (TUSSIONEX PENNKINETIC ER) 10-8 MG/5ML SUER    benzonatate (TESSALON) 200 MG capsule    azithromycin (ZITHROMAX) 250 MG tablet        Follow up plan: Return in about 2 weeks (around 08/04/2015) for Lung recheck.

## 2015-08-04 ENCOUNTER — Encounter: Payer: Self-pay | Admitting: Family Medicine

## 2015-08-04 ENCOUNTER — Ambulatory Visit (INDEPENDENT_AMBULATORY_CARE_PROVIDER_SITE_OTHER): Payer: Managed Care, Other (non HMO) | Admitting: Family Medicine

## 2015-08-04 VITALS — BP 135/85 | HR 94 | Temp 98.4°F | Ht 69.5 in | Wt 134.0 lb

## 2015-08-04 DIAGNOSIS — J441 Chronic obstructive pulmonary disease with (acute) exacerbation: Secondary | ICD-10-CM

## 2015-08-04 NOTE — Progress Notes (Signed)
BP 135/85 mmHg  Pulse 94  Temp(Src) 98.4 F (36.9 C)  Ht 5' 9.5" (1.765 m)  Wt 134 lb (60.782 kg)  BMI 19.51 kg/m2  SpO2 98%   Subjective:    Patient ID: Julian Medina, male    DOB: April 05, 1946, 70 y.o.   MRN: [id number]  HPI: Julian Medina is a 70 y.o. male  Chief Complaint  Patient presents with  . lung recheck   Here today for a lung recheck. Took his medicine and is feeling much better. Lungs are clear. No complaints.   Relevant past medical, surgical, family and social history reviewed and updated as indicated. Interim medical history since our last visit reviewed. Allergies and medications reviewed and updated.  Review of Systems  Constitutional: Negative.   HENT: Negative.   Respiratory: Negative.   Cardiovascular: Negative.   Psychiatric/Behavioral: Negative.     Per HPI unless specifically indicated above     Objective:    BP 135/85 mmHg  Pulse 94  Temp(Src) 98.4 F (36.9 C)  Ht 5' 9.5" (1.765 m)  Wt 134 lb (60.782 kg)  BMI 19.51 kg/m2  SpO2 98%  Wt Readings from Last 3 Encounters:  08/04/15 134 lb (60.782 kg)  07/21/15 135 lb (61.236 kg)  05/04/15 128 lb (58.06 kg)    Physical Exam  Constitutional: He is oriented to person, place, and time. He appears well-developed and well-nourished. No distress.  HENT:  Head: Normocephalic and atraumatic.  Right Ear: Hearing normal.  Left Ear: Hearing normal.  Nose: Nose normal.  Eyes: Conjunctivae and lids are normal. Right eye exhibits no discharge. Left eye exhibits no discharge. No scleral icterus.  Cardiovascular: Normal rate, regular rhythm, normal heart sounds and intact distal pulses.  Exam reveals no gallop and no friction rub.   No murmur heard. Pulmonary/Chest: Effort normal and breath sounds normal. No respiratory distress. He has no wheezes. He has no rales. He exhibits no tenderness.  Musculoskeletal: Normal range of motion.  Neurological: He is alert and oriented to person, place, and  time.  Skin: Skin is warm, dry and intact. No rash noted. No erythema. No pallor.  Psychiatric: He has a normal mood and affect. His speech is normal and behavior is normal. Judgment and thought content normal. Cognition and memory are normal.  Nursing note and vitals reviewed.   Results for orders placed or performed in visit on 05/04/15  Microscopic Examination  Result Value Ref Range   WBC, UA 0-5 0 -  5 /hpf   RBC, UA None seen 0 -  2 /hpf   Epithelial Cells (non renal) None seen 0 - 10 /hpf   Renal Epithel, UA None seen None seen /hpf   Casts None seen None seen /lpf   Cast Type None seen N/A   Crystals None seen N/A   Crystal Type None seen N/A   Mucus, UA None seen Not Estab.   Bacteria, UA None seen None seen/Few   Yeast, UA None seen None seen   Trichomonas, UA None seen None seen  Comprehensive metabolic panel  Result Value Ref Range   Glucose 92 65 - 99 mg/dL   BUN 14 8 - 27 mg/dL   Creatinine, Ser 1.23 0.76 - 1.27 mg/dL   GFR calc non Af Amer 60 >59 mL/min/1.73   GFR calc Af Amer 69 >59 mL/min/1.73   BUN/Creatinine Ratio 11 10 - 22   Sodium 139 136 - 144 mmol/L   Potassium 5.0 3.5 -  5.2 mmol/L   Chloride 99 97 - 106 mmol/L   CO2 23 18 - 29 mmol/L   Calcium 9.9 8.6 - 10.2 mg/dL   Total Protein 6.7 6.0 - 8.5 g/dL   Albumin 4.9 (H) 3.6 - 4.8 g/dL   Globulin, Total 1.8 1.5 - 4.5 g/dL   Albumin/Globulin Ratio 2.7 (H) 1.1 - 2.5   Bilirubin Total 0.5 0.0 - 1.2 mg/dL   Alkaline Phosphatase 66 39 - 117 IU/L   AST 19 0 - 40 IU/L   ALT 13 0 - 44 IU/L  Lipid panel  Result Value Ref Range   Cholesterol, Total 283 (H) 100 - 199 mg/dL   Triglycerides 83 0 - 149 mg/dL   HDL 115 >39 mg/dL   VLDL Cholesterol Cal 17 5 - 40 mg/dL   LDL Calculated 151 (H) 0 - 99 mg/dL   Chol/HDL Ratio 2.5 0.0 - 5.0 ratio units  TSH  Result Value Ref Range   TSH 1.170 0.450 - 4.500 uIU/mL  Urinalysis, Routine w reflex microscopic (not at [facility])  Result Value Ref Range   Specific Gravity,  UA <1.005 (L) 1.005 - 1.030   pH, UA 5.5 5.0 - 7.5   Color, UA Yellow Yellow   Appearance Ur Clear Clear   Leukocytes, UA Trace (A) Negative   Protein, UA Negative Negative/Trace   Glucose, UA Negative Negative   Ketones, UA Negative Negative   RBC, UA Trace (A) Negative   Bilirubin, UA Negative Negative   Urobilinogen, Ur 0.2 0.2 - 1.0 mg/dL   Nitrite, UA Negative Negative   Microscopic Examination See below:   PSA  Result Value Ref Range   Prostate Specific Ag, Serum 1.5 0.0 - 4.0 ng/mL  Please Note  Result Value Ref Range   Please note Comment       Assessment & Plan:   Problem List Items Addressed This Visit    None    Visit Diagnoses    COPD exacerbation ([place])    -  Primary    Resolved. Lungs clear. Continue inhalers. Continue to monitor. Return if starting to feel sick again.         Follow up plan: Return if symptoms worsen or fail to improve.

## 2015-08-31 ENCOUNTER — Inpatient Hospital Stay: Payer: Managed Care, Other (non HMO) | Attending: Oncology

## 2015-08-31 ENCOUNTER — Inpatient Hospital Stay (HOSPITAL_BASED_OUTPATIENT_CLINIC_OR_DEPARTMENT_OTHER): Payer: Managed Care, Other (non HMO) | Admitting: Oncology

## 2015-08-31 VITALS — BP 146/84 | HR 73 | Temp 97.0°F | Resp 16

## 2015-08-31 DIAGNOSIS — C9110 Chronic lymphocytic leukemia of B-cell type not having achieved remission: Secondary | ICD-10-CM | POA: Diagnosis not present

## 2015-08-31 DIAGNOSIS — J449 Chronic obstructive pulmonary disease, unspecified: Secondary | ICD-10-CM

## 2015-08-31 DIAGNOSIS — Z79899 Other long term (current) drug therapy: Secondary | ICD-10-CM | POA: Insufficient documentation

## 2015-08-31 DIAGNOSIS — F419 Anxiety disorder, unspecified: Secondary | ICD-10-CM | POA: Diagnosis not present

## 2015-08-31 DIAGNOSIS — F17210 Nicotine dependence, cigarettes, uncomplicated: Secondary | ICD-10-CM | POA: Insufficient documentation

## 2015-08-31 DIAGNOSIS — N289 Disorder of kidney and ureter, unspecified: Secondary | ICD-10-CM | POA: Diagnosis not present

## 2015-08-31 DIAGNOSIS — N2889 Other specified disorders of kidney and ureter: Secondary | ICD-10-CM

## 2015-08-31 LAB — CBC WITH DIFFERENTIAL/PLATELET
BASOS PCT: 1 %
Basophils Absolute: 0.1 10*3/uL (ref 0–0.1)
EOS ABS: 0.2 10*3/uL (ref 0–0.7)
Eosinophils Relative: 3 %
HCT: 40.2 % (ref 40.0–52.0)
HEMOGLOBIN: 13.8 g/dL (ref 13.0–18.0)
LYMPHS ABS: 1.9 10*3/uL (ref 1.0–3.6)
Lymphocytes Relative: 23 %
MCH: 30.7 pg (ref 26.0–34.0)
MCHC: 34.4 g/dL (ref 32.0–36.0)
MCV: 89.2 fL (ref 80.0–100.0)
Monocytes Absolute: 0.7 10*3/uL (ref 0.2–1.0)
Monocytes Relative: 8 %
NEUTROS PCT: 65 %
Neutro Abs: 5.5 10*3/uL (ref 1.4–6.5)
Platelets: 195 10*3/uL (ref 150–440)
RBC: 4.5 MIL/uL (ref 4.40–5.90)
RDW: 15.6 % — ABNORMAL HIGH (ref 11.5–14.5)
WBC: 8.4 10*3/uL (ref 3.8–10.6)

## 2015-08-31 NOTE — Progress Notes (Signed)
[place]  [phone] Fax:(336) [phone]  ID: Julian Medina OB: 10/21/1945  MR#: [id number]  [id number]  Patient Care Team: Guadalupe Maple, MD as PCP - General (Family Medicine)  CHIEF COMPLAINT:  Chief Complaint  Patient presents with  . CLL    INTERVAL HISTORY: Patient returns to clinic today for further evaluation and laboratory work.  He continues to feel well and is asymptomatic. He continues to be highly anxious. He denies any recent fevers. He has no neurologic complaints. He denies any pain. He has no chest pain or shortness of breath. He denies any nausea, vomiting, constipation, or diarrhea. He has no urinary complaints. Patient offers no specific complaints today.   REVIEW OF SYSTEMS:   Review of Systems  Constitutional: Negative for fever, weight loss and malaise/fatigue.  Respiratory: Negative.   Cardiovascular: Negative.   Genitourinary: Negative.   Neurological: Negative for weakness.  Psychiatric/Behavioral: The patient is nervous/anxious.     As per HPI. Otherwise, a complete review of systems is negatve.  PAST MEDICAL HISTORY: Past Medical History  Diagnosis Date  . Anxiety   . COPD (chronic obstructive pulmonary disease) ([place])     PAST SURGICAL HISTORY: Past Surgical History  Procedure Laterality Date  . Hernia repair      FAMILY HISTORY: Heart disease and hypertension.     ADVANCED DIRECTIVES:    HEALTH MAINTENANCE: Social History  Substance Use Topics  . Smoking status: Current Every Day Smoker -- 1.50 packs/day for 40 years    Types: Cigarettes  . Smokeless tobacco: Never Used  . Alcohol Use: Yes     Comment: occasional     Colonoscopy:  PAP:  Bone density:  Lipid panel:  Allergies  Allergen Reactions  . Aspirin     Current Outpatient Prescriptions  Medication Sig Dispense Refill  . amLODipine (NORVASC) 10 MG tablet Take 1 tablet (10 mg total) by mouth daily. 90 tablet 4  .  benazepril (LOTENSIN) 40 MG tablet 1 TABLET ONCE EACH DAY ORAL 90 tablet 4  . Fluticasone-Salmeterol (ADVAIR DISKUS) 250-50 MCG/DOSE AEPB INHALE 1 PUFF 2 TIMES A DAY 180 each 4   Current Facility-Administered Medications  Medication Dose Route Frequency Provider Last Rate Last Dose  . albuterol (PROVENTIL) (2.5 MG/3ML) 0.083% nebulizer solution 2.5 mg  2.5 mg Nebulization Once [employer], DO        OBJECTIVE: Filed Vitals:   08/31/15 1047  BP: 146/84  Pulse: 73  Temp: 97 F (36.1 C)  Resp: 16     There is no weight on file to calculate BMI.    ECOG FS:0 - Asymptomatic  General: Well-developed, well-nourished, no acute distress. Eyes: Pink conjunctiva, anicteric sclera. Lungs: Clear to auscultation bilaterally. Heart: Regular rate and rhythm. No rubs, murmurs, or gallops. Abdomen: Soft, nontender, nondistended. No organomegaly noted, normoactive bowel sounds. Musculoskeletal: No edema, cyanosis, or clubbing. Neuro: Alert, answering all questions appropriately. Cranial nerves grossly intact. Skin: No rashes or petechiae noted. Psych: Normal affect.   LAB RESULTS:  Lab Results  Component Value Date   NA 139 05/04/2015   K 5.0 05/04/2015   CL 99 05/04/2015   CO2 23 05/04/2015   GLUCOSE 92 05/04/2015   BUN 14 05/04/2015   CREATININE 1.23 05/04/2015   CALCIUM 9.9 05/04/2015   PROT 6.7 05/04/2015   ALBUMIN 4.9* 05/04/2015   AST 19 05/04/2015   ALT 13 05/04/2015   ALKPHOS 66 05/04/2015   BILITOT 0.5 05/04/2015   GFRNONAA 60  05/04/2015   GFRAA 69 05/04/2015    Lab Results  Component Value Date   WBC 8.4 08/31/2015   NEUTROABS 5.5 08/31/2015   HGB 13.8 08/31/2015   HCT 40.2 08/31/2015   MCV 89.2 08/31/2015   PLT 195 08/31/2015     STUDIES: No results found.  ASSESSMENT: CLL, Rai stage 0, suspicious left kidney lesion.   PLAN:    1.  CLL: Flow cytometry confirmed the diagnosis.  Patient is also ZAP-70 positive this which indicates it may be a more  aggressive form of CLL.  CT scan of neck, chest, abdomen, and pelvis with no further evidence of disease. No further treatment is planned at this time, but patient will likely need retreatment again in the future. He last received treatment with Rituxan and Treanda in September 2015. Return to clinic in 3 months with repeat laboratory work and then in 6 months with repeat laboratory work and further evaluation.  2.  Anxiety: Continue Xanax PRN. 3.  Renal lesion: MRI results reviewed independently with highly suspicious lesion in his left kidney. Patient has seen urology and has elected for observation rather than robotic-assisted partial nephrectomy. Repeat MRI ordered for the next 1-2 weeks.    Patient expressed understanding and was in agreement with this plan. He also understands that He can call clinic at any time with any questions, concerns, or complaints.     Mayra Reel, NP   08/31/2015 2:10 PM  Patient seen and evaluated independently and I agree with the assessment and plan as dictated above.  Lloyd Huger, MD 09/03/2015 8:01 AM

## 2015-08-31 NOTE — Progress Notes (Signed)
Patient does not offer any problems today.  

## 2015-09-15 ENCOUNTER — Telehealth: Payer: Self-pay | Admitting: *Deleted

## 2015-09-15 DIAGNOSIS — C9110 Chronic lymphocytic leukemia of B-cell type not having achieved remission: Secondary | ICD-10-CM

## 2015-09-15 MED ORDER — ALPRAZOLAM 0.5 MG PO TABS: 0.5000 mg | ORAL_TABLET | Freq: Once | ORAL | Status: DC

## 2015-09-15 NOTE — Telephone Encounter (Signed)
Julian Medina, Julian Medina Male 1946-04-06     SSN: [id number]    Address: [address] [place] [address]    Phone: [phone] [facility]) [phone] (Work)      Message     Received: Today    Lloyd Huger, MD  Betti Cruz, RN; Cephus Richer; Evlyn Kanner, NP; Vanice Sarah, [place]    Phone Number: [phone]            Same as before. xanax 0.5mg . Thanks    Called in med and pt informed

## 2015-09-15 NOTE — Telephone Encounter (Signed)
Called to request the "pill I get before scan for nerves" be called in to [facility] in [place] His MRI is 09/21/15.

## 2015-09-20 ENCOUNTER — Other Ambulatory Visit: Payer: Self-pay | Admitting: Oncology

## 2015-09-20 ENCOUNTER — Other Ambulatory Visit: Payer: Self-pay | Admitting: *Deleted

## 2015-09-20 ENCOUNTER — Ambulatory Visit: Payer: Managed Care, Other (non HMO)

## 2015-09-20 DIAGNOSIS — C9110 Chronic lymphocytic leukemia of B-cell type not having achieved remission: Secondary | ICD-10-CM

## 2015-09-21 ENCOUNTER — Other Ambulatory Visit
Admission: RE | Admit: 2015-09-21 | Discharge: 2015-09-21 | Disposition: A | Discharge status: Home / Self Care | Payer: Managed Care, Other (non HMO) | Source: Ambulatory Visit | Attending: Oncology | Admitting: Oncology

## 2015-09-21 ENCOUNTER — Ambulatory Visit
Admission: RE | Admit: 2015-09-21 | Discharge: 2015-09-21 | Disposition: A | Discharge status: Home / Self Care | Payer: Managed Care, Other (non HMO) | Source: Ambulatory Visit | Attending: Oncology | Admitting: Oncology

## 2015-09-21 DIAGNOSIS — C9110 Chronic lymphocytic leukemia of B-cell type not having achieved remission: Secondary | ICD-10-CM | POA: Diagnosis present

## 2015-09-21 DIAGNOSIS — N2889 Other specified disorders of kidney and ureter: Secondary | ICD-10-CM | POA: Diagnosis not present

## 2015-09-21 DIAGNOSIS — N281 Cyst of kidney, acquired: Secondary | ICD-10-CM | POA: Insufficient documentation

## 2015-09-21 LAB — CREATININE, SERUM
Creatinine, Ser: 1.3 mg/dL — ABNORMAL HIGH (ref 0.61–1.24)
GFR calc Af Amer: >60 mL/min (ref >60)
GFR calc non Af Amer: 55 mL/min — ABNORMAL LOW (ref >60)

## 2015-09-21 LAB — BUN: BUN: 14 mg/dL (ref 6–20)

## 2015-09-21 MED ORDER — GADOBENATE DIMEGLUMINE 529 MG/ML IV SOLN
15.0000 mL | Freq: Once | INTRAVENOUS | Status: AC | PRN
  Administered 2015-09-21: 12 mL via INTRAVENOUS

## 2015-10-18 ENCOUNTER — Encounter: Payer: Self-pay | Admitting: Urology

## 2015-10-18 ENCOUNTER — Ambulatory Visit (INDEPENDENT_AMBULATORY_CARE_PROVIDER_SITE_OTHER): Payer: Managed Care, Other (non HMO) | Admitting: Urology

## 2015-10-18 VITALS — BP 150/88 | HR 85 | Ht 69.0 in | Wt 130.0 lb

## 2015-10-18 DIAGNOSIS — N2889 Other specified disorders of kidney and ureter: Secondary | ICD-10-CM | POA: Diagnosis not present

## 2015-10-18 DIAGNOSIS — N183 Chronic kidney disease, stage 3 unspecified: Secondary | ICD-10-CM

## 2015-10-18 DIAGNOSIS — C9110 Chronic lymphocytic leukemia of B-cell type not having achieved remission: Secondary | ICD-10-CM | POA: Diagnosis not present

## 2015-10-18 NOTE — Progress Notes (Signed)
10/18/2015 2:51 PM   Julian Medina 07-Jul-1946 [id number]  Referring provider: Guadalupe Maple, MD [address] [place], [place] [address]  Chief Complaint  Patient presents with  . Renal Mass    HPI: 70 year old male with history of left renal mass previously scheduled for robotic partial nephrectomy approximately one year ago. At that time, he changed his mind and canceled his surgery. He returns today 1 year later to revisit the possibility of proceeding with surgery. In the interim, he underwent repeat imaging in the form of abdominal MRI with and without contrast on 09/21/2015.  This revealed a 2.2 x 2 x 1.7 cm enhancing left upper pole exophytic renal lesion which had increased in size from previous study (1.5 x 1.5 x 1.7 cm).  No evidence of metastatic disease or renal vein involvement.  His past medical history is significant for CLL.  His last treatment with Rituxan and Treanda September 2015 without evidence evidence of recurrence on most recent staging imaging.    He denies a history of flank pain or gross hematuria. No family history of renal cell carcinoma.  He has had previous abdominal surgery including a RIGHT inguinal hernia repair with mesh (open) as well as a large midline incision extending from the xiphoid down to the umbilicus for perforated gastric ulcer in his 2s.   Excellent functional status.  PMH: Past Medical History  Diagnosis Date  . Anxiety   . COPD (chronic obstructive pulmonary disease) [facility])     Surgical History: Past Surgical History  Procedure Laterality Date  . Hernia repair      Home Medications:    Medication List       This list is accurate as of: 10/18/15  2:51 PM.  Always use your most recent med list.               amLODipine 10 MG tablet  Commonly known as:  NORVASC  Take 1 tablet (10 mg total) by mouth daily.     benazepril 40 MG tablet  Commonly known as:  LOTENSIN  1 TABLET ONCE EACH DAY ORAL     Fluticasone-Salmeterol 250-50 MCG/DOSE Aepb  Commonly known as:  ADVAIR DISKUS  INHALE 1 PUFF 2 TIMES A DAY        Allergies:  Allergies  Allergen Reactions  . Aspirin     Family History: Family History  Problem Relation Age of Onset  . Hearing loss Brother     Social History:  reports that he has been smoking Cigarettes.  He has a 60 pack-year smoking history. He has never used smokeless tobacco. He reports that he drinks alcohol. He reports that he does not use illicit drugs.  ROS: UROLOGY Frequent Urination?: Yes Hard to postpone urination?: No Burning/pain with urination?: No Get up at night to urinate?: Yes Leakage of urine?: No Urine stream starts and stops?: No Trouble starting stream?: No Do you have to strain to urinate?: No Blood in urine?: No Urinary tract infection?: No Sexually transmitted disease?: No Injury to kidneys or bladder?: No Painful intercourse?: No Weak stream?: No Erection problems?: No Penile pain?: No  Gastrointestinal Nausea?: No Vomiting?: No Indigestion/heartburn?: No Diarrhea?: No Constipation?: No  Constitutional Fever: No Night sweats?: No Weight loss?: No Fatigue?: No  Skin Skin rash/lesions?: No Itching?: No  Eyes Blurred vision?: No Double vision?: No  Ears/Nose/Throat Sore throat?: No Sinus problems?: No  Hematologic/Lymphatic Swollen glands?: No Easy bruising?: No  Cardiovascular Leg swelling?: No Chest pain?:  No  Respiratory Cough?: No Shortness of breath?: No  Endocrine Excessive thirst?: No  Musculoskeletal Back pain?: No Joint pain?: No  Neurological Headaches?: No Dizziness?: No  Psychologic Depression?: No Anxiety?: No  Physical Exam: BP 150/88 mmHg  Pulse 85  Ht 5\' 9"  (1.753 m)  Wt 130 lb (58.968 kg)  BMI 19.19 kg/m2  Constitutional:  Alert and oriented, No acute distress. HEENT: [place] AT, moist mucus membranes.  Trachea midline, no masses. Cardiovascular: No clubbing,  cyanosis, or edema.  RRR. Respiratory: Normal respiratory effort, no increased work of breathing. CTAB. GI: Abdomen is soft, nontender, nondistended, no abdominal masses GU: No CVA tenderness. Midline abdominal incision and RLQ incision scar. Skin: No rashes, bruises or suspicious lesions. Lymph: No cervical or inguinal adenopathy. Neurologic: Grossly intact, no focal deficits, moving all 4 extremities. Psychiatric: Normal mood and affect.  Mildly anxious.    Laboratory Data: Lab Results  Component Value Date   WBC 8.4 08/31/2015   HGB 13.8 08/31/2015   HCT 40.2 08/31/2015   MCV 89.2 08/31/2015   PLT 195 08/31/2015    Lab Results  Component Value Date   CREATININE 1.30* 09/21/2015    Pertinent Imaging:    Study Result     CLINICAL DATA: 70 year old male with history of enhancing renal lesion in the upper pole the left kidney. Followup study.  EXAM: MRI ABDOMEN WITHOUT AND WITH CONTRAST  TECHNIQUE: Multiplanar multisequence MR imaging of the abdomen was performed both before and after the administration of intravenous contrast.  CONTRAST: 75mL MULTIHANCE GADOBENATE DIMEGLUMINE 529 MG/ML IV SOLN  COMPARISON: MRI of the abdomen 03/14/2015.  FINDINGS: Lower chest: Unremarkable.  Hepatobiliary: No cystic or solid hepatic lesions. No intra or extrahepatic biliary ductal dilatation. Gallbladder is normal in appearance.  Pancreas: No pancreatic mass. No pancreatic ductal dilatation. No pancreatic or peripancreatic fluid or inflammatory changes.  Spleen: Unremarkable.  Adrenals/Urinary Tract: Previously noted lesion in the medial aspect of the upper pole is slightly hypointense on T1 weighted images, heterogeneously T2 hyperintense (with multiple T2 hypointense internal septations and soft tissue), and demonstrates extensive post gadolinium enhancement, compatible with a small renal neoplasm. This lesion is slightly larger than the prior examination  and currently measures 1.7 x 2.0 x 2.2 cm (axial image 28 of series 9 and coronal image 37 of series 12). This lesion appears completely in capsulated within Gerota's fascia and is well separated from the left renal vein. There are again multiple other sub cm T1 hypointense, T2 hyperintense, nonenhancing lesions, compatible with tiny simple cysts. There also sub cm T1 hyperintense, T2 isointense to hypointense nonenhancing lesions in the kidneys bilaterally, compatible with tiny proteinaceous and/or hemorrhagic cysts. No hydroureteronephrosis in the visualized abdomen.  Stomach/Bowel: Visualized portions are unremarkable.  Vascular/Lymphatic: No aneurysm identified in the visualized abdominal vasculature. Left renal vein is widely patent. No lymphadenopathy noted in the abdomen.  Other: No significant volume of ascites in the visualized abdomen.  Musculoskeletal: No aggressive osseous lesions are noted in the visualized portions of the skeleton.  IMPRESSION: 1. Enlargement of an enhancing the lesion in the upper pole of the left kidney, which currently measures 1.7 x 2.0 x 2.2 cm and remains compatible with a small renal neoplasm, likely a renal cell carcinoma. Urologic consultation is recommended if not artery obtained. 2. Multiple small simple cysts and proteinaceous/hemorrhagic cysts in the kidneys bilaterally are similar to the prior examination.   Electronically Signed  By: Vinnie Langton M.D.  On: 09/21/2015 09:34    MRI  abd reviewed personally today and with the patient  Assessment & Plan:  70 yo M with enhancing exophytic left upper pole renal mass now measuring 2.2 cm gradually increasing in size highly concerning for RCC.    1. Left renal mass  A solid renal mass raises the suspicion of primary renal malignancy. We discussed this in detail and in regards to the spectrum of renal masses which includes cysts (pure cysts are considered benign), solid  masses and everything in between. The risk of metastasis increases as the size of solid renal mass increases. In general, it is believed that the risk of metastasis for renal masses less than 3-4 cm is small (up to approximately 5%) based mainly on large retrospective studies. In some cases and especially in patients of older age and multiple comorbidities a surveillance approach may be appropriate. The treatment of solid renal masses includes: surveillance, cryoablation (percutaneous and laparoscopic) in addition to partial and complete nephrectomy (each with option of laparoscopic, robotic and open depending on appropriateness). Furthermore, nephrectomy appears to be an independent risk factor for the development of chronic kidney disease suggesting that nephron sparing approaches should be implored whenever feasible. We reviewed these options in context of the patients current situation as well as the pros and cons of each. We also discussed the role of biopsy today and given the appearance on imaging, I suspect this lesion has approximately 80-90% chance of representing a malignancy. He is most interested in pursuing robotic partial nephrectomy for excision of the small lesion. We discussed the risks of the procedure including risk of significant blood loss including possible need for blood transfusion, urine leak, need for conversion to open procedure, or radical nephrectomy, damage to bowel or surrounding structures, need for posttoperative hospitalization proximally 2 nights, need for postooperative drain and Foley catheter.   All of his questions were answered today in detail.  2. CKD (chronic kidney disease) stage 3, GFR 30-59 ml/min Baseline Cr 1.3, prefer to proceed with nephron sparing surgery.  3. Chronic lymphocytic leukemia ([place]) NED.  Stable. No treatment needed since 2015.   [name], MD  [facility] [address] [place], [place]  [address] [phone]  I spent 45 min with this patient of which greater than 50% was spent in counseling and coordination of care with the patient. All of his questions were answered and options reviewed in detail.

## 2015-10-20 ENCOUNTER — Telehealth: Payer: Self-pay

## 2015-10-20 ENCOUNTER — Telehealth: Payer: Self-pay | Admitting: Radiology

## 2015-10-20 NOTE — Telephone Encounter (Signed)
i thought he was already on anxiety/sleep medication?  Probably should discuss with PCP first.

## 2015-10-20 NOTE — Telephone Encounter (Signed)
Tried to return call but no answer.  Left general message to call the office.

## 2015-10-20 NOTE — Telephone Encounter (Signed)
LMOM. Need to notify pt of surgery information. 

## 2015-10-20 NOTE — Telephone Encounter (Signed)
Pt wife would like a return call to [phone]

## 2015-10-20 NOTE — Telephone Encounter (Signed)
Notified pt's wife of surgery scheduled 11/14/15, pre-admit testing appt on 10/31/15 @8 :15 and to call day prior to surgery for arrival time to SDS. Wife voices understanding.

## 2015-10-20 NOTE — Telephone Encounter (Signed)
-----   Message from Cephus Richer sent at 10/20/2015  8:43 AM EDT ----- PT need something to help him sleep. Please call wife at [phone] if you have any question.

## 2015-10-23 NOTE — Telephone Encounter (Signed)
Left message at [phone] to return call.

## 2015-10-24 ENCOUNTER — Inpatient Hospital Stay: Payer: Managed Care, Other (non HMO) | Admitting: Oncology

## 2015-10-26 ENCOUNTER — Encounter: Payer: Self-pay | Admitting: Urology

## 2015-10-26 ENCOUNTER — Ambulatory Visit (INDEPENDENT_AMBULATORY_CARE_PROVIDER_SITE_OTHER): Payer: Managed Care, Other (non HMO) | Admitting: Family Medicine

## 2015-10-26 ENCOUNTER — Encounter: Payer: Self-pay | Admitting: Family Medicine

## 2015-10-26 VITALS — BP 143/80 | HR 78 | Temp 97.6°F | Ht 70.3 in | Wt 132.0 lb

## 2015-10-26 DIAGNOSIS — Z889 Allergy status to unspecified drugs, medicaments and biological substances status: Secondary | ICD-10-CM

## 2015-10-26 DIAGNOSIS — I10 Essential (primary) hypertension: Secondary | ICD-10-CM

## 2015-10-26 DIAGNOSIS — F419 Anxiety disorder, unspecified: Secondary | ICD-10-CM | POA: Diagnosis not present

## 2015-10-26 DIAGNOSIS — T7840XA Allergy, unspecified, initial encounter: Secondary | ICD-10-CM

## 2015-10-26 DIAGNOSIS — J441 Chronic obstructive pulmonary disease with (acute) exacerbation: Secondary | ICD-10-CM | POA: Diagnosis not present

## 2015-10-26 DIAGNOSIS — C9110 Chronic lymphocytic leukemia of B-cell type not having achieved remission: Secondary | ICD-10-CM

## 2015-10-26 MED ORDER — PREDNISONE 10 MG PO TABS: ORAL_TABLET | ORAL | Status: DC

## 2015-10-26 MED ORDER — LORAZEPAM 1 MG PO TABS: 1.0000 mg | ORAL_TABLET | Freq: Every day | ORAL | Status: DC | PRN

## 2015-10-26 MED ORDER — AZITHROMYCIN 250 MG PO TABS: ORAL_TABLET | ORAL | Status: DC

## 2015-10-26 NOTE — Assessment & Plan Note (Signed)
Working with Dr. Grayland Ormond of hematology and stable

## 2015-10-26 NOTE — Progress Notes (Signed)
BP 143/80 mmHg  Pulse 78  Temp(Src) 97.6 F (36.4 C)  Ht 5' 10.3" (1.786 m)  Wt 132 lb (59.875 kg)  BMI 18.77 kg/m2  SpO2 99%   Subjective:    Patient ID: Julian Medina, male    DOB: July 15, 1946, 70 y.o.   MRN: [id number]  HPI: Julian Medina is a 70 y.o. male  Chief Complaint  Patient presents with  . URI    x 1 week  . Rash    started after taking Nyquil  Patient stopped NyQuil rash is itching a great deal with rash on the skin not really clearing up. Has been coughing a great deal bringing up sputum feeling bad has cut back his smoking a great deal but still smokes occasionally has previous diagnosis of COPD. Z-Pak previously helped a lot Patient also with great deal of anxiety not sleeping with pending renal cancer surgery coming up in May.   Relevant past medical, surgical, family and social history reviewed and updated as indicated. Interim medical history since our last visit reviewed. Allergies and medications reviewed and updated.  Review of Systems  Constitutional: Positive for chills and fatigue. Negative for fever and diaphoresis.  Respiratory: Positive for cough and wheezing. Negative for shortness of breath and stridor.   Cardiovascular: Negative for chest pain, palpitations and leg swelling.  Skin:       Erythematous rash legs arms abdomen  Psychiatric/Behavioral: Hyperactive: erythematous rash legs and arms abdomen.     Per HPI unless specifically indicated above     Objective:    BP 143/80 mmHg  Pulse 78  Temp(Src) 97.6 F (36.4 C)  Ht 5' 10.3" (1.786 m)  Wt 132 lb (59.875 kg)  BMI 18.77 kg/m2  SpO2 99%  Wt Readings from Last 3 Encounters:  10/26/15 132 lb (59.875 kg)  10/18/15 130 lb (58.968 kg)  08/04/15 134 lb (60.782 kg)    Physical Exam  Constitutional: He is oriented to person, place, and time. He appears well-developed and well-nourished. No distress.  HENT:  Head: Normocephalic and atraumatic.  Right Ear: Hearing normal.  Left Ear:  Hearing normal.  Nose: Nose normal.  Eyes: Conjunctivae and lids are normal. Right eye exhibits no discharge. Left eye exhibits no discharge. No scleral icterus.  Cardiovascular: Normal rate, regular rhythm and normal heart sounds.   Pulmonary/Chest: Effort normal. No respiratory distress. He has no wheezes. He has no rales. He exhibits no tenderness.  Occasional rhonchi  Musculoskeletal: Normal range of motion.  Neurological: He is alert and oriented to person, place, and time.  Skin: Skin is warm, dry and intact. Rash noted.  Rashes noted above  Psychiatric: He has a normal mood and affect. His speech is normal and behavior is normal. Judgment and thought content normal. Cognition and memory are normal.    Results for orders placed or performed during the hospital encounter of 09/21/15  Creatinine, serum  Result Value Ref Range   Creatinine, Ser 1.30 (H) 0.61 - 1.24 mg/dL   GFR calc non Af Amer 55 (L) >60 mL/min   GFR calc Af Amer >60 >60 mL/min  BUN  Result Value Ref Range   BUN 14 6 - 20 mg/dL      Assessment & Plan:   Problem List Items Addressed This Visit      Cardiovascular and Mediastinum   Essential hypertension    The current medical regimen is effective;  continue present plan and medications.  Other   Chronic lymphocytic leukemia ([place])    Working with Dr. Grayland Ormond of hematology and stable      Relevant Medications   predniSONE (DELTASONE) 10 MG tablet   LORazepam (ATIVAN) 1 MG tablet   Anxiety    Presurgical and cancer anxiety with insomnia will give lorazepam with cautions about use and habit-forming nature.      Relevant Medications   LORazepam (ATIVAN) 1 MG tablet    Other Visit Diagnoses    Acute exacerbation of chronic obstructive bronchitis ([place])    -  Primary    Discussed care and treatment use of Advair antibiotics prednisone will also help which is been given for his rash.    Relevant Medications    predniSONE (DELTASONE) 10 MG tablet     azithromycin (ZITHROMAX) 250 MG tablet    Allergic reaction caused by a drug            Follow up plan: Return for As scheduled.

## 2015-10-26 NOTE — Assessment & Plan Note (Signed)
The current medical regimen is effective;  continue present plan and medications.  

## 2015-10-26 NOTE — Assessment & Plan Note (Signed)
Presurgical and cancer anxiety with insomnia will give lorazepam with cautions about use and habit-forming nature.

## 2015-10-31 ENCOUNTER — Encounter
Admission: RE | Admit: 2015-10-31 | Discharge: 2015-10-31 | Disposition: A | Discharge status: Home / Self Care | Payer: Managed Care, Other (non HMO) | Source: Ambulatory Visit | Attending: Urology | Admitting: Urology

## 2015-10-31 DIAGNOSIS — Z01812 Encounter for preprocedural laboratory examination: Secondary | ICD-10-CM | POA: Insufficient documentation

## 2015-10-31 DIAGNOSIS — N2889 Other specified disorders of kidney and ureter: Secondary | ICD-10-CM | POA: Insufficient documentation

## 2015-10-31 HISTORY — DX: Chronic lymphocytic leukemia of B-cell type not having achieved remission: C91.10

## 2015-10-31 LAB — URINALYSIS COMPLETE WITH MICROSCOPIC (ARMC ONLY)
Bacteria, UA: NONE SEEN
Bilirubin Urine: NEGATIVE
Glucose, UA: NEGATIVE mg/dL
HGB URINE DIPSTICK: NEGATIVE
Ketones, ur: NEGATIVE mg/dL
Leukocytes, UA: NEGATIVE
NITRITE: NEGATIVE
PH: 6 (ref 5.0–8.0)
PROTEIN: NEGATIVE mg/dL
SPECIFIC GRAVITY, URINE: 1.002 — AB (ref 1.005–1.030)

## 2015-10-31 LAB — BASIC METABOLIC PANEL
ANION GAP: 9 (ref 5–15)
BUN: 14 mg/dL (ref 6–20)
CHLORIDE: 105 mmol/L (ref 101–111)
CO2: 26 mmol/L (ref 22–32)
Calcium: 9.8 mg/dL (ref 8.9–10.3)
Creatinine, Ser: 1.14 mg/dL (ref 0.61–1.24)
GLUCOSE: 84 mg/dL (ref 65–99)
Potassium: 4 mmol/L (ref 3.5–5.1)
SODIUM: 140 mmol/L (ref 135–145)

## 2015-10-31 LAB — CBC
HEMATOCRIT: 37.7 % — AB (ref 40.0–52.0)
HEMOGLOBIN: 12.8 g/dL — AB (ref 13.0–18.0)
MCH: 29.8 pg (ref 26.0–34.0)
MCHC: 34 g/dL (ref 32.0–36.0)
MCV: 87.9 fL (ref 80.0–100.0)
Platelets: 358 10*3/uL (ref 150–440)
RBC: 4.29 MIL/uL — ABNORMAL LOW (ref 4.40–5.90)
RDW: 15.3 % — AB (ref 11.5–14.5)
WBC: 13.5 10*3/uL — AB (ref 3.8–10.6)

## 2015-10-31 LAB — PROTIME-INR
INR: 0.91
Prothrombin Time: 12.5 seconds (ref 11.4–15.0)

## 2015-10-31 LAB — ABO/RH: ABO/RH(D): O NEG

## 2015-10-31 LAB — APTT: aPTT: 25 seconds (ref 24–36)

## 2015-10-31 NOTE — Patient Instructions (Signed)
  Your procedure is scheduled on: 11/14/15 Tues Report to Same Day Surgery 2nd floor medical mall To find out your arrival time please call [phone] between 1PM - 3PM on  11/13/15 Mon   Remember: Instructions that are not followed completely may result in serious medical risk, up to and including death, or upon the discretion of your surgeon and anesthesiologist your surgery may need to be rescheduled.    _x___ 1. Do not eat food or drink liquids after midnight. No gum chewing or hard candies.     __x__ 2. No Alcohol for 24 hours before or after surgery.   ____ 3. Bring all medications with you on the day of surgery if instructed.    __x__ 4. Notify your doctor if there is any change in your medical condition     (cold, fever, infections).     Do not wear jewelry, make-up, hairpins, clips or nail polish.  Do not wear lotions, powders, or perfumes. You may wear deodorant.  Do not shave 48 hours prior to surgery. Men may shave face and neck.  Do not bring valuables to the hospital.    [facility] is not responsible for any belongings or valuables.               Contacts, dentures or bridgework may not be worn into surgery.  Leave your suitcase in the car. After surgery it may be brought to your room.  For patients admitted to the hospital, discharge time is determined by your treatment team.   Patients discharged the day of surgery will not be allowed to drive home.    Please read over the following fact sheets that you were given:   [facility] Preparing for Surgery and or MRSA Information   _x___ Take these medicines the morning of surgery with A SIP OF WATER:    1. Fluticasone-Salmeterol (ADVAIR DISKUS) 250-50 MCG/DOSE AEPB  2.  3.  4.  5.  6.  ____ Fleet Enema (as directed)   _x___ Use CHG Soap or sage wipes as directed on instruction sheet   __x__ Use inhalers on the day of surgery and bring to hospital day of surgery  ____ Stop metformin 2 days prior to  surgery    ____ Take 1/2 of usual insulin dose the night before surgery and none on the morning of surgery.   ____ Stop aspirin or coumadin, or plavix  _x__ Stop Anti-inflammatories such as Advil, Aleve, Ibuprofen, Motrin, Naproxen,          Naprosyn, Goodies powders or aspirin products. Ok to take Tylenol.   ____ Stop supplements until after surgery.    ____ Bring C-Pap to the hospital.

## 2015-11-01 ENCOUNTER — Telehealth: Payer: Self-pay | Admitting: Radiology

## 2015-11-01 LAB — URINE CULTURE: Culture: NO GROWTH

## 2015-11-01 NOTE — Telephone Encounter (Signed)
Made Dr Erlene Quan aware of WBC of 13.5. Ok to proceed with surgery per Dr Erlene Quan.

## 2015-11-06 ENCOUNTER — Encounter: Payer: Self-pay | Admitting: Family Medicine

## 2015-11-06 ENCOUNTER — Ambulatory Visit (INDEPENDENT_AMBULATORY_CARE_PROVIDER_SITE_OTHER): Payer: Managed Care, Other (non HMO) | Admitting: Family Medicine

## 2015-11-06 VITALS — BP 130/71 | HR 81 | Temp 97.8°F | Ht 69.2 in | Wt 125.0 lb

## 2015-11-06 DIAGNOSIS — C9110 Chronic lymphocytic leukemia of B-cell type not having achieved remission: Secondary | ICD-10-CM

## 2015-11-06 DIAGNOSIS — J42 Unspecified chronic bronchitis: Secondary | ICD-10-CM | POA: Diagnosis not present

## 2015-11-06 DIAGNOSIS — I10 Essential (primary) hypertension: Secondary | ICD-10-CM

## 2015-11-06 MED ORDER — AZITHROMYCIN 250 MG PO TABS: ORAL_TABLET | ORAL | Status: DC

## 2015-11-06 NOTE — Assessment & Plan Note (Signed)
Will give patient another Z-Pak Again encouraged smoking cessation Continue Advair

## 2015-11-06 NOTE — Assessment & Plan Note (Signed)
The current medical regimen is effective;  continue present plan and medications.  

## 2015-11-06 NOTE — Progress Notes (Signed)
BP 130/71 mmHg  Pulse 81  Temp(Src) 97.8 F (36.6 C)  Ht 5' 9.2" (1.758 m)  Wt 125 lb (56.7 kg)  BMI 18.35 kg/m2  SpO2 99%   Subjective:    Patient ID: Julian Medina, male    DOB: Feb 07, 1946, 70 y.o.   MRN: [id number]  HPI: Julian Medina is a 70 y.o. male  Chief Complaint  Patient presents with  . Hypertension  Patient doing well blood pressure no complaints good control no side effects from amlodipine Benzapril which he takes faithfully. COPD exacerbation improved greatly with Z-Pak but still some residual symptoms wants another Z-Pak to [place] knocking it out as he still coughing some. Rash and breathing greatly improved on prednisone Lorazepam use less than 1 every 3 days or so. Only takes for very bad days   Relevant past medical, surgical, family and social history reviewed and updated as indicated. Interim medical history since our last visit reviewed. Allergies and medications reviewed and updated.  Review of Systems  Constitutional: Positive for fatigue. Negative for chills.  Respiratory: Positive for cough, shortness of breath and wheezing. Negative for apnea and chest tightness.   Cardiovascular: Negative.     Per HPI unless specifically indicated above     Objective:    BP 130/71 mmHg  Pulse 81  Temp(Src) 97.8 F (36.6 C)  Ht 5' 9.2" (1.758 m)  Wt 125 lb (56.7 kg)  BMI 18.35 kg/m2  SpO2 99%  Wt Readings from Last 3 Encounters:  11/06/15 125 lb (56.7 kg)  10/31/15 133 lb (60.328 kg)  10/26/15 132 lb (59.875 kg)    Physical Exam  Constitutional: He is oriented to person, place, and time. He appears well-developed and well-nourished. No distress.  HENT:  Head: Normocephalic and atraumatic.  Right Ear: Hearing normal.  Left Ear: Hearing normal.  Nose: Nose normal.  Eyes: Conjunctivae and lids are normal. Right eye exhibits no discharge. Left eye exhibits no discharge. No scleral icterus.  Cardiovascular: Normal rate, regular rhythm and normal heart  sounds.   Pulmonary/Chest: Effort normal. No respiratory distress. He has no wheezes. He has no rales.  occ rhonchi  Musculoskeletal: Normal range of motion.  Neurological: He is alert and oriented to person, place, and time.  Skin: Skin is intact. No rash noted.  Psychiatric: He has a normal mood and affect. His speech is normal and behavior is normal. Judgment and thought content normal. Cognition and memory are normal.    Results for orders placed or performed during the hospital encounter of 10/31/15  Urine culture  Result Value Ref Range   Specimen Description URINE, CLEAN CATCH    Special Requests NONE    Culture NO GROWTH 1 DAY    Report Status 11/01/2015 FINAL   CBC  Result Value Ref Range   WBC 13.5 (H) 3.8 - 10.6 K/uL   RBC 4.29 (L) 4.40 - 5.90 MIL/uL   Hemoglobin 12.8 (L) 13.0 - 18.0 g/dL   HCT 37.7 (L) 40.0 - 52.0 %   MCV 87.9 80.0 - 100.0 fL   MCH 29.8 26.0 - 34.0 pg   MCHC 34.0 32.0 - 36.0 g/dL   RDW 15.3 (H) 11.5 - 14.5 %   Platelets 358 150 - 440 K/uL  Basic metabolic panel  Result Value Ref Range   Sodium 140 135 - 145 mmol/L   Potassium 4.0 3.5 - 5.1 mmol/L   Chloride 105 101 - 111 mmol/L   CO2 26 22 - 32 mmol/L  Glucose, Bld 84 65 - 99 mg/dL   BUN 14 6 - 20 mg/dL   Creatinine, Ser 1.14 0.61 - 1.24 mg/dL   Calcium 9.8 8.9 - 10.3 mg/dL   GFR calc non Af Amer >60 >60 mL/min   GFR calc Af Amer >60 >60 mL/min   Anion gap 9 5 - 15  Protime-INR  Result Value Ref Range   Prothrombin Time 12.5 11.4 - 15.0 seconds   INR 0.91   APTT  Result Value Ref Range   aPTT 25 24 - 36 seconds  Urinalysis complete, with microscopic ([facility] only)  Result Value Ref Range   Color, Urine STRAW (A) YELLOW   APPearance CLEAR (A) CLEAR   Glucose, UA NEGATIVE NEGATIVE mg/dL   Bilirubin Urine NEGATIVE NEGATIVE   Ketones, ur NEGATIVE NEGATIVE mg/dL   Specific Gravity, Urine 1.002 (L) 1.005 - 1.030   Hgb urine dipstick NEGATIVE NEGATIVE   pH 6.0 5.0 - 8.0   Protein, ur  NEGATIVE NEGATIVE mg/dL   Nitrite NEGATIVE NEGATIVE   Leukocytes, UA NEGATIVE NEGATIVE   RBC / HPF 0-5 0 - 5 RBC/hpf   WBC, UA 0-5 0 - 5 WBC/hpf   Bacteria, UA NONE SEEN NONE SEEN   Squamous Epithelial / LPF 0-5 (A) NONE SEEN  Type and screen [facility]  Result Value Ref Range   ABO/RH(D) O NEG    Antibody Screen NEG    Sample Expiration 11/14/2015    Extend sample reason NO TRANSFUSIONS OR PREGNANCY IN THE PAST 3 MONTHS   ABO/Rh  Result Value Ref Range   ABO/RH(D) O NEG       Assessment & Plan:   Problem List Items Addressed This Visit      Cardiovascular and Mediastinum   Essential hypertension - Primary    The current medical regimen is effective;  continue present plan and medications.         Respiratory   COPD (chronic obstructive pulmonary disease) ([place])    Will give patient another Z-Pak Again encouraged smoking cessation Continue Advair      Relevant Medications   azithromycin (ZITHROMAX) 250 MG tablet     Other   Chronic lymphocytic leukemia ([place])    Patient having venous access issues discussed exercises to increase venous caliber Thomas squeezing rubber balls etc.          Follow up plan: Return in about 6 months (around 05/07/2016), or if symptoms worsen or fail to improve, for Physical Exam.

## 2015-11-06 NOTE — Assessment & Plan Note (Signed)
Patient having venous access issues discussed exercises to increase venous caliber Thomas squeezing rubber balls etc.

## 2015-11-13 LAB — PREPARE RBC (CROSSMATCH)

## 2015-11-14 ENCOUNTER — Inpatient Hospital Stay
Admission: RE | Admit: 2015-11-14 | Discharge: 2015-11-16 | DRG: 658 | Disposition: A | Discharge status: Home / Self Care | Payer: Managed Care, Other (non HMO) | Source: Ambulatory Visit | Attending: Urology | Admitting: Urology

## 2015-11-14 ENCOUNTER — Inpatient Hospital Stay: Payer: Managed Care, Other (non HMO) | Admitting: Anesthesiology

## 2015-11-14 ENCOUNTER — Encounter
Admission: RE | Disposition: A | Discharge status: Home / Self Care | Payer: Self-pay | Source: Ambulatory Visit | Attending: Urology

## 2015-11-14 DIAGNOSIS — K660 Peritoneal adhesions (postprocedural) (postinfection): Secondary | ICD-10-CM | POA: Diagnosis present

## 2015-11-14 DIAGNOSIS — J449 Chronic obstructive pulmonary disease, unspecified: Secondary | ICD-10-CM | POA: Diagnosis present

## 2015-11-14 DIAGNOSIS — Z856 Personal history of leukemia: Secondary | ICD-10-CM

## 2015-11-14 DIAGNOSIS — N183 Chronic kidney disease, stage 3 (moderate): Secondary | ICD-10-CM | POA: Diagnosis present

## 2015-11-14 DIAGNOSIS — N2889 Other specified disorders of kidney and ureter: Secondary | ICD-10-CM

## 2015-11-14 DIAGNOSIS — C642 Malignant neoplasm of left kidney, except renal pelvis: Secondary | ICD-10-CM | POA: Diagnosis present

## 2015-11-14 DIAGNOSIS — Z886 Allergy status to analgesic agent status: Secondary | ICD-10-CM

## 2015-11-14 DIAGNOSIS — F17210 Nicotine dependence, cigarettes, uncomplicated: Secondary | ICD-10-CM | POA: Diagnosis present

## 2015-11-14 DIAGNOSIS — N281 Cyst of kidney, acquired: Secondary | ICD-10-CM | POA: Diagnosis present

## 2015-11-14 DIAGNOSIS — F419 Anxiety disorder, unspecified: Secondary | ICD-10-CM | POA: Diagnosis present

## 2015-11-14 DIAGNOSIS — Z8711 Personal history of peptic ulcer disease: Secondary | ICD-10-CM

## 2015-11-14 HISTORY — PX: ROBOTIC ASSITED PARTIAL NEPHRECTOMY: SHX6087

## 2015-11-14 LAB — BASIC METABOLIC PANEL
Anion gap: 10 (ref 5–15)
BUN: 15 mg/dL (ref 6–20)
CALCIUM: 8.6 mg/dL — AB (ref 8.9–10.3)
CO2: 20 mmol/L — ABNORMAL LOW (ref 22–32)
CREATININE: 1.17 mg/dL (ref 0.61–1.24)
Chloride: 103 mmol/L (ref 101–111)
GFR calc Af Amer: >60 mL/min (ref >60)
GLUCOSE: 151 mg/dL — AB (ref 65–99)
Potassium: 4.6 mmol/L (ref 3.5–5.1)
SODIUM: 133 mmol/L — AB (ref 135–145)

## 2015-11-14 LAB — CBC
HCT: 39.3 % — ABNORMAL LOW (ref 40.0–52.0)
Hemoglobin: 13.3 g/dL (ref 13.0–18.0)
MCH: 31 pg (ref 26.0–34.0)
MCHC: 33.7 g/dL (ref 32.0–36.0)
MCV: 91.7 fL (ref 80.0–100.0)
PLATELETS: 121 10*3/uL — AB (ref 150–440)
RBC: 4.28 MIL/uL — AB (ref 4.40–5.90)
RDW: 15.6 % — AB (ref 11.5–14.5)
WBC: 15.1 10*3/uL — ABNORMAL HIGH (ref 3.8–10.6)

## 2015-11-14 LAB — PREPARE RBC (CROSSMATCH)

## 2015-11-14 SURGERY — ROBOTIC ASSITED PARTIAL NEPHRECTOMY
Anesthesia: General | Laterality: Left | Wound class: Clean Contaminated

## 2015-11-14 MED ORDER — FAMOTIDINE 20 MG PO TABS
20.0000 mg | ORAL_TABLET | Freq: Once | ORAL | Status: AC
  Administered 2015-11-14: 20 mg via ORAL

## 2015-11-14 MED ORDER — CEFAZOLIN SODIUM-DEXTROSE 2-4 GM/100ML-% IV SOLN
INTRAVENOUS | Status: AC
  Filled 2015-11-14: qty 100

## 2015-11-14 MED ORDER — GLYCOPYRROLATE 0.2 MG/ML IJ SOLN
INTRAMUSCULAR | Status: DC | PRN
  Administered 2015-11-14: .5 mg via INTRAVENOUS

## 2015-11-14 MED ORDER — MORPHINE SULFATE (PF) 2 MG/ML IV SOLN
2.0000 mg | INTRAVENOUS | Status: DC | PRN
  Administered 2015-11-14: 2 mg via INTRAVENOUS
  Filled 2015-11-14: qty 1

## 2015-11-14 MED ORDER — PHENYLEPHRINE HCL 10 MG/ML IJ SOLN
INTRAMUSCULAR | Status: DC | PRN
  Administered 2015-11-14: 50 ug via INTRAVENOUS
  Administered 2015-11-14: 100 ug via INTRAVENOUS
  Administered 2015-11-14 (×2): 50 ug via INTRAVENOUS

## 2015-11-14 MED ORDER — FENTANYL CITRATE (PF) 100 MCG/2ML IJ SOLN
25.0000 ug | INTRAMUSCULAR | Status: DC | PRN
  Administered 2015-11-14 (×4): 25 ug via INTRAVENOUS

## 2015-11-14 MED ORDER — HEPARIN SODIUM (PORCINE) 5000 UNIT/ML IJ SOLN
5000.0000 [IU] | Freq: Three times a day (TID) | INTRAMUSCULAR | Status: DC
  Administered 2015-11-14 – 2015-11-16 (×5): 5000 [IU] via SUBCUTANEOUS
  Filled 2015-11-14 (×5): qty 1

## 2015-11-14 MED ORDER — SODIUM CHLORIDE 0.9 % IV SOLN
INTRAVENOUS | Status: DC
  Administered 2015-11-14 – 2015-11-16 (×5): via INTRAVENOUS

## 2015-11-14 MED ORDER — ACETAMINOPHEN 325 MG PO TABS: 650.0000 mg | ORAL_TABLET | ORAL | Status: DC | PRN

## 2015-11-14 MED ORDER — THROMBIN 5000 UNITS EX SOLR
CUTANEOUS | Status: DC | PRN
  Administered 2015-11-14: 5000 [IU] via TOPICAL

## 2015-11-14 MED ORDER — OXYBUTYNIN CHLORIDE 5 MG PO TABS: 5.0000 mg | ORAL_TABLET | Freq: Three times a day (TID) | ORAL | Status: DC | PRN

## 2015-11-14 MED ORDER — BUPIVACAINE HCL 0.5 % IJ SOLN
INTRAMUSCULAR | Status: DC | PRN
Start: 2015-11-14 — End: 2015-11-14
  Administered 2015-11-14: 10 mL

## 2015-11-14 MED ORDER — OXYCODONE-ACETAMINOPHEN 5-325 MG PO TABS
1.0000 | ORAL_TABLET | ORAL | Status: DC | PRN
  Administered 2015-11-15 – 2015-11-16 (×4): 1 via ORAL
  Filled 2015-11-14 (×4): qty 1

## 2015-11-14 MED ORDER — LACTATED RINGERS IV SOLN
INTRAVENOUS | Status: DC
  Administered 2015-11-14 (×2): via INTRAVENOUS

## 2015-11-14 MED ORDER — FENTANYL CITRATE (PF) 100 MCG/2ML IJ SOLN
INTRAMUSCULAR | Status: AC
  Administered 2015-11-14: 25 ug via INTRAVENOUS
  Filled 2015-11-14: qty 2

## 2015-11-14 MED ORDER — NICOTINE 21 MG/24HR TD PT24: 21.0000 mg | MEDICATED_PATCH | Freq: Every day | TRANSDERMAL | Status: DC

## 2015-11-14 MED ORDER — AMLODIPINE BESYLATE 10 MG PO TABS
10.0000 mg | ORAL_TABLET | Freq: Every day | ORAL | Status: DC
Start: 2015-11-14 — End: 2015-11-16
  Administered 2015-11-14 – 2015-11-15 (×2): 10 mg via ORAL
  Filled 2015-11-14 (×2): qty 1

## 2015-11-14 MED ORDER — ONDANSETRON HCL 4 MG/2ML IJ SOLN
INTRAMUSCULAR | Status: DC | PRN
  Administered 2015-11-14: 4 mg via INTRAVENOUS

## 2015-11-14 MED ORDER — CEFAZOLIN SODIUM-DEXTROSE 2-4 GM/100ML-% IV SOLN
2.0000 g | Freq: Once | INTRAVENOUS | Status: AC
Start: 2015-11-14 — End: 2015-11-14
  Administered 2015-11-14: 2 g via INTRAVENOUS

## 2015-11-14 MED ORDER — FENTANYL CITRATE (PF) 100 MCG/2ML IJ SOLN
INTRAMUSCULAR | Status: DC | PRN
  Administered 2015-11-14: 100 ug via INTRAVENOUS
  Administered 2015-11-14: 50 ug via INTRAVENOUS
  Administered 2015-11-14 (×2): 25 ug via INTRAVENOUS
  Administered 2015-11-14: 50 ug via INTRAVENOUS

## 2015-11-14 MED ORDER — NEOSTIGMINE METHYLSULFATE 10 MG/10ML IV SOLN
INTRAVENOUS | Status: DC | PRN
  Administered 2015-11-14: 3 mg via INTRAVENOUS

## 2015-11-14 MED ORDER — EVICEL 5 ML EX KIT
PACK | CUTANEOUS | Status: DC | PRN
  Administered 2015-11-14: 5 mL

## 2015-11-14 MED ORDER — DIPHENHYDRAMINE HCL 12.5 MG/5ML PO ELIX: 12.5000 mg | ORAL_SOLUTION | Freq: Four times a day (QID) | ORAL | Status: DC | PRN

## 2015-11-14 MED ORDER — DIPHENHYDRAMINE HCL 50 MG/ML IJ SOLN
12.5000 mg | Freq: Four times a day (QID) | INTRAMUSCULAR | Status: DC | PRN
  Administered 2015-11-15: 12.5 mg via INTRAVENOUS
  Filled 2015-11-14: qty 1

## 2015-11-14 MED ORDER — BENAZEPRIL HCL 20 MG PO TABS
40.0000 mg | ORAL_TABLET | Freq: Every day | ORAL | Status: DC
  Administered 2015-11-14 – 2015-11-15 (×2): 40 mg via ORAL
  Filled 2015-11-14 (×2): qty 2

## 2015-11-14 MED ORDER — MIDAZOLAM HCL 5 MG/5ML IJ SOLN
INTRAMUSCULAR | Status: DC | PRN
Start: 2015-11-14 — End: 2015-11-14
  Administered 2015-11-14: 2 mg via INTRAVENOUS

## 2015-11-14 MED ORDER — LORAZEPAM 1 MG PO TABS
1.0000 mg | ORAL_TABLET | Freq: Every day | ORAL | Status: DC | PRN
  Administered 2015-11-15: 1 mg via ORAL
  Filled 2015-11-14: qty 1

## 2015-11-14 MED ORDER — PROPOFOL 10 MG/ML IV BOLUS
INTRAVENOUS | Status: DC | PRN
Start: 2015-11-14 — End: 2015-11-14
  Administered 2015-11-14: 150 mg via INTRAVENOUS

## 2015-11-14 MED ORDER — CEFAZOLIN SODIUM 1-5 GM-% IV SOLN
1.0000 g | Freq: Three times a day (TID) | INTRAVENOUS | Status: AC
  Administered 2015-11-14 – 2015-11-15 (×2): 1 g via INTRAVENOUS
  Filled 2015-11-14 (×4): qty 50

## 2015-11-14 MED ORDER — DOCUSATE SODIUM 100 MG PO CAPS
100.0000 mg | ORAL_CAPSULE | Freq: Two times a day (BID) | ORAL | Status: DC
  Administered 2015-11-14 – 2015-11-16 (×4): 100 mg via ORAL
  Filled 2015-11-14 (×4): qty 1

## 2015-11-14 MED ORDER — FAMOTIDINE 20 MG PO TABS
ORAL_TABLET | ORAL | Status: AC
  Administered 2015-11-14: 20 mg via ORAL
  Filled 2015-11-14: qty 1

## 2015-11-14 MED ORDER — ONDANSETRON HCL 4 MG/2ML IJ SOLN: 4.0000 mg | INTRAMUSCULAR | Status: DC | PRN

## 2015-11-14 MED ORDER — DEXAMETHASONE SODIUM PHOSPHATE 10 MG/ML IJ SOLN
INTRAMUSCULAR | Status: DC | PRN
Start: 2015-11-14 — End: 2015-11-14
  Administered 2015-11-14: 5 mg via INTRAVENOUS

## 2015-11-14 MED ORDER — FUROSEMIDE 10 MG/ML IJ SOLN
INTRAMUSCULAR | Status: DC | PRN
  Administered 2015-11-14: 10 mg via INTRAMUSCULAR

## 2015-11-14 MED ORDER — IPRATROPIUM-ALBUTEROL 0.5-2.5 (3) MG/3ML IN SOLN
3.0000 mL | Freq: Once | RESPIRATORY_TRACT | Status: DC | PRN
Start: 2015-11-14 — End: 2015-11-14

## 2015-11-14 MED ORDER — MANNITOL 25 % IV SOLN
INTRAVENOUS | Status: DC | PRN
Start: 2015-11-14 — End: 2015-11-14
  Administered 2015-11-14 (×2): 12.5 g via INTRAVENOUS

## 2015-11-14 MED ORDER — MOMETASONE FURO-FORMOTEROL FUM 200-5 MCG/ACT IN AERO
2.0000 | INHALATION_SPRAY | Freq: Two times a day (BID) | RESPIRATORY_TRACT | Status: DC
  Administered 2015-11-14 – 2015-11-16 (×4): 2 via RESPIRATORY_TRACT
  Filled 2015-11-14: qty 8.8

## 2015-11-14 MED ORDER — IPRATROPIUM-ALBUTEROL 0.5-2.5 (3) MG/3ML IN SOLN
RESPIRATORY_TRACT | Status: AC
  Filled 2015-11-14: qty 3

## 2015-11-14 MED ORDER — IPRATROPIUM-ALBUTEROL 0.5-2.5 (3) MG/3ML IN SOLN
3.0000 mL | Freq: Once | RESPIRATORY_TRACT | Status: AC
  Administered 2015-11-14: 3 mL via RESPIRATORY_TRACT

## 2015-11-14 MED ORDER — OXYCODONE HCL 5 MG PO TABS: 5.0000 mg | ORAL_TABLET | Freq: Once | ORAL | Status: DC | PRN

## 2015-11-14 MED ORDER — OXYCODONE HCL 5 MG/5ML PO SOLN: 5.0000 mg | Freq: Once | ORAL | Status: DC | PRN

## 2015-11-14 MED ORDER — ROCURONIUM BROMIDE 100 MG/10ML IV SOLN
INTRAVENOUS | Status: DC | PRN
Start: 2015-11-14 — End: 2015-11-14
  Administered 2015-11-14: 20 mg via INTRAVENOUS
  Administered 2015-11-14: 30 mg via INTRAVENOUS
  Administered 2015-11-14 (×2): 10 mg via INTRAVENOUS
  Administered 2015-11-14: 20 mg via INTRAVENOUS
  Administered 2015-11-14 (×2): 10 mg via INTRAVENOUS

## 2015-11-14 MED ORDER — LIDOCAINE HCL (CARDIAC) 20 MG/ML IV SOLN
INTRAVENOUS | Status: DC | PRN
  Administered 2015-11-14: 60 mg via INTRAVENOUS

## 2015-11-14 SURGICAL SUPPLY — 89 items
ANCHOR TIS RET SYS 235ML (MISCELLANEOUS) ×2 IMPLANT
APPLICATOR SURGIFLO (MISCELLANEOUS) ×2 IMPLANT
BULB RESERV EVAC DRAIN JP 100C (MISCELLANEOUS) ×2 IMPLANT
CANISTER SUCT 1200ML W/VALVE (MISCELLANEOUS) ×2 IMPLANT
CHLORAPREP W/TINT 26ML (MISCELLANEOUS) ×4 IMPLANT
CLIP LIGATING HEM O LOK PURPLE (MISCELLANEOUS) ×10 IMPLANT
CLIP LIGATING HEMO O LOK GREEN (MISCELLANEOUS) IMPLANT
CLIP SUT LAPRA TY ABSORB (SUTURE) ×8 IMPLANT
CORD BIP STRL DISP 12FT (MISCELLANEOUS) ×2 IMPLANT
COVER TIP SHEARS 8 DVNC (MISCELLANEOUS) ×1 IMPLANT
COVER TIP SHEARS 8MM DA VINCI (MISCELLANEOUS) ×1
DEFOGGER SCOPE WARMER CLEARIFY (MISCELLANEOUS) ×2 IMPLANT
DRAIN CHANNEL JP 19F (MISCELLANEOUS) ×2 IMPLANT
DRAPE SHEET LG 3/4 BI-LAMINATE (DRAPES) ×2 IMPLANT
DRAPE SURG 17X11 SM STRL (DRAPES) ×8 IMPLANT
DRIVER LRG NEEDLE DA VINCI (INSTRUMENTS) ×1
DRIVER NDLE LRG DVNC (INSTRUMENTS) ×1 IMPLANT
ELECT PAD DSPR THERM+ ADLT (MISCELLANEOUS) IMPLANT
ELECT REM PT RETURN 9FT ADLT (ELECTROSURGICAL) ×2
ELECTRODE REM PT RTRN 9FT ADLT (ELECTROSURGICAL) ×1 IMPLANT
EVICEL 5ML SEALNT HUMAN B (Miscellaneous) ×2 IMPLANT
FILTER LAP SMOKE EVAC STRL (MISCELLANEOUS) ×2 IMPLANT
GLOVE BIO SURGEON STRL SZ 6.5 (GLOVE) ×6 IMPLANT
GLOVE BIOGEL PI IND STRL 6.5 (GLOVE) ×4 IMPLANT
GLOVE BIOGEL PI INDICATOR 6.5 (GLOVE) ×4
GOWN STRL REUS W/ TWL LRG LVL3 (GOWN DISPOSABLE) ×6 IMPLANT
GOWN STRL REUS W/TWL LRG LVL3 (GOWN DISPOSABLE) ×6
GRASPER DBL FENESTRATED (INSTRUMENTS) ×1
GRASPER DBL FENESTRATED DVNC (INSTRUMENTS) ×1 IMPLANT
GRASPER SUT TROCAR 14GX15 (MISCELLANEOUS) ×2 IMPLANT
HEMOSTAT SURGICEL 2X14 (HEMOSTASIS) ×4 IMPLANT
IRRIGATION STRYKERFLOW (MISCELLANEOUS) ×1 IMPLANT
IRRIGATOR STRYKERFLOW (MISCELLANEOUS) ×2
IV LACTATED RINGERS 1000ML (IV SOLUTION) ×2 IMPLANT
IV NS 1000ML (IV SOLUTION) ×1
IV NS 1000ML BAXH (IV SOLUTION) ×1 IMPLANT
KIT ACCESSORY DA VINCI DISP (KITS) ×1
KIT ACCESSORY DVNC DISP (KITS) ×1 IMPLANT
KIT RM TURNOVER STRD PROC AR (KITS) ×2 IMPLANT
LABEL OR SOLS (LABEL) ×2 IMPLANT
LIQUID BAND (GAUZE/BANDAGES/DRESSINGS) ×2 IMPLANT
LOOP RED MAXI  1X406MM (MISCELLANEOUS) ×1
LOOP VESSEL MAXI 1X406 RED (MISCELLANEOUS) ×1 IMPLANT
NDL SAFETY 18GX1.5 (NEEDLE) ×2 IMPLANT
NEEDLE HYPO 25X1 1.5 SAFETY (NEEDLE) ×2 IMPLANT
NEEDLE INSUFFLATION 14GA 120MM (NEEDLE) ×2 IMPLANT
NS IRRIG 500ML POUR BTL (IV SOLUTION) ×2 IMPLANT
PACK LAP CHOLECYSTECTOMY (MISCELLANEOUS) ×2 IMPLANT
PAD TRENDELENBURG OR TABLE (MISCELLANEOUS) ×4 IMPLANT
PENCIL ELECTRO HAND CTR (MISCELLANEOUS) ×2 IMPLANT
PROBE ULTRASOUND PROART (MISCELLANEOUS) ×2 IMPLANT
PROGRASP ENDOWRIST DA VINCI (INSTRUMENTS) ×1
PROGRASP ENDOWRIST DVNC (INSTRUMENTS) ×1 IMPLANT
SCISSORS METZENBAUM CVD 33 (INSTRUMENTS) IMPLANT
SLEEVE ENDOPATH XCEL 5M (ENDOMECHANICALS) ×4 IMPLANT
SOLUTION ELECTROLUBE (MISCELLANEOUS) ×2 IMPLANT
SPOGE SURGIFLO 8M (HEMOSTASIS) ×1
SPONGE EXCIL AMD DRAIN 4X4 6P (MISCELLANEOUS) ×2 IMPLANT
SPONGE SURGIFLO 8M (HEMOSTASIS) ×1 IMPLANT
STAPLE RELOAD 2.5MM WHITE (STAPLE) IMPLANT
STAPLER SKIN PROX 35W (STAPLE) ×2 IMPLANT
STAPLER VASCULAR ECHELON 35 (CUTTER) IMPLANT
STRAP SAFETY BODY (MISCELLANEOUS) ×6 IMPLANT
SUT DVC VLOC 90 3-0 CV23 UNDY (SUTURE) ×6 IMPLANT
SUT ETHILON 3-0 FS-10 30 BLK (SUTURE) ×2
SUT MNCRL AB 4-0 PS2 18 (SUTURE) ×4 IMPLANT
SUT PDS PLUS 0 (SUTURE)
SUT PDS PLUS AB 0 CT-2 (SUTURE) IMPLANT
SUT PROLENE 4 0 RB 1 (SUTURE) ×2
SUT PROLENE 4-0 RB1 .5 CRCL 36 (SUTURE) ×2 IMPLANT
SUT VIC AB 0 CT1 36 (SUTURE) ×4 IMPLANT
SUT VIC AB 0 CT2 27 (SUTURE) ×2 IMPLANT
SUT VIC AB 2-0 SH 27 (SUTURE) ×6
SUT VIC AB 2-0 SH 27XBRD (SUTURE) ×6 IMPLANT
SUT VICRYL 0 AB UR-6 (SUTURE) ×4 IMPLANT
SUT VICRYL PLUS ABS 0 54 (SUTURE) IMPLANT
SUTURE EHLN 3-0 FS-10 30 BLK (SUTURE) ×1 IMPLANT
SYR 3ML LL SCALE MARK (SYRINGE) ×2 IMPLANT
TAPE CLOTH 10X20 WHT NS LF (TAPE) ×2 IMPLANT
TAPE CLOTH 2X10 WHT NS LF (TAPE) ×2
TIP RIGID 35CM EVICEL (HEMOSTASIS) ×2 IMPLANT
TRAY FOLEY W/METER SILVER 16FR (SET/KITS/TRAYS/PACK) ×2 IMPLANT
TROCAR 12M 150ML BLUNT (TROCAR) ×2 IMPLANT
TROCAR DISP BLADELESS 8 DVNC (TROCAR) ×1 IMPLANT
TROCAR DISP BLADELESS 8MM (TROCAR) ×1
TROCAR ENDOPATH XCEL 12X100 BL (ENDOMECHANICALS) ×2 IMPLANT
TROCAR XCEL 12X100 BLDLESS (ENDOMECHANICALS) ×2 IMPLANT
TROCAR XCEL NON-BLD 5MMX100MML (ENDOMECHANICALS) ×2 IMPLANT
TUBING INSUFFLATOR HI FLOW (MISCELLANEOUS) ×2 IMPLANT

## 2015-11-14 NOTE — Anesthesia Procedure Notes (Signed)
Procedure Name: Intubation Date/Time: 11/14/2015 10:22 AM Performed by: Dionne Bucy Pre-anesthesia Checklist: Patient identified, Patient being monitored, Timeout performed, Emergency Drugs available and Suction available Patient Re-evaluated:Patient Re-evaluated prior to inductionOxygen Delivery Method: Circle system utilized Preoxygenation: Pre-oxygenation with 100% oxygen Intubation Type: IV induction Ventilation: Mask ventilation without difficulty Laryngoscope Size: Mac and 4 Grade View: Grade I Tube type: Oral Tube size: 7.5 mm Number of attempts: 1 Airway Equipment and Method: Stylet Placement Confirmation: ETT inserted through vocal cords under direct vision,  positive ETCO2 and breath sounds checked- equal and bilateral Secured at: 23 cm Tube secured with: Tape Dental Injury: Teeth and Oropharynx as per pre-operative assessment

## 2015-11-14 NOTE — Anesthesia Preprocedure Evaluation (Signed)
Anesthesia Evaluation  Patient identified by MRN, date of birth, ID band Patient awake    Reviewed: Allergy & Precautions, H&P , NPO status , Patient's Chart, lab work & pertinent test results  History of Anesthesia Complications Negative for: history of anesthetic complications  Airway Mallampati: III  TM Distance: >3 FB Neck ROM: limited    Dental  (+) Poor Dentition, Chipped, Missing, Partial Lower, Partial Upper   Pulmonary neg pulmonary ROS, shortness of breath and with exertion, COPD,  COPD inhaler, Current Smoker,    Pulmonary exam normal breath sounds clear to auscultation       Cardiovascular Exercise Tolerance: Good hypertension, (-) angina+ DOE  (-) Past MI Normal cardiovascular exam Rhythm:regular Rate:Normal     Neuro/Psych PSYCHIATRIC DISORDERS Anxiety negative neurological ROS     GI/Hepatic Neg liver ROS, PUD, neg GERD  ,  Endo/Other  negative endocrine ROS  Renal/GU negative Renal ROS  negative genitourinary   Musculoskeletal   Abdominal   Peds  Hematology negative hematology ROS (+)   Anesthesia Other Findings Past Medical History:   Anxiety                                                      COPD (chronic obstructive pulmonary disease) (*              CLL (chronic lymphocytic leukemia) ([facility])                    Past Surgical History:   HERNIA REPAIR                                                BMI    Body Mass Index   19.08 kg/m 2      Reproductive/Obstetrics negative OB ROS                             Anesthesia Physical Anesthesia Plan  ASA: III  Anesthesia Plan: General ETT   Post-op Pain Management:    Induction:   Airway Management Planned:   Additional Equipment:   Intra-op Plan:   Post-operative Plan:   Informed Consent: I have reviewed the patients History and Physical, chart, labs and discussed the procedure including the risks,  benefits and alternatives for the proposed anesthesia with the patient or authorized representative who has indicated his/her understanding and acceptance.   Dental Advisory Given  Plan Discussed with: Anesthesiologist, CRNA and Surgeon  Anesthesia Plan Comments:         Anesthesia Quick Evaluation

## 2015-11-14 NOTE — Transfer of Care (Signed)
Immediate Anesthesia Transfer of Care Note  Patient: Julian Medina  Procedure(s) Performed: Procedure(s): ROBOTIC ASSITED PARTIAL NEPHRECTOMY with intraoperative drop in ultrasound / Extensive Lysis of adhesions (Left)  Patient Location: PACU  Anesthesia Type:General  Level of Consciousness: sedated  Airway & Oxygen Therapy: Patient Spontanous Breathing and Patient connected to face mask oxygen  Post-op Assessment: Report given to RN  Post vital signs: Reviewed and stable  Last Vitals:  Filed Vitals:   11/14/15 1510 11/14/15 1513  BP: 140/84 140/84  Pulse: 65 66  Temp: 36.1 C 73F  Resp: 16 14    Last Pain:  Filed Vitals:   11/14/15 1513  PainSc: Asleep         Complications: No apparent anesthesia complications

## 2015-11-14 NOTE — Op Note (Signed)
PREOPERATIVE DIAGNOSIS: Left renal mass    POSTOPERATIVE DIAGNOSIS: Left renal mass    OPERATION PERFORMED: 1. Robotic assisted laparoscopic left partial nephrectomy 2. Intraoperative ultrasound with interpretation. 3. Extensive lysis of adhesions, greater than 30 min  SURGEON: [name], MD.  Physician Assistant: Link Snuffer, PA  FINDINGS: Hilar anatomy: 1 arteries. 1 veins. 1.8 cm solid renal mass Small 2 mm lesion adjacent to primary mass excised, somewhat suspicious for a satellite lesion  Warm ischemia time: 15 min.  SPECIMENS: 1. Left renal mass 2. Suspicious area adjacent to left renal mass   BLOOD LOSS: 15 cc  ANESTHESIA: General.  COMPLICATIONS: None.  DRAINS: 16-French Foley catheter. 19 Round JP  INDICATIONS FOR OPERATION: Left renal mass  DESCRIPTION OF OPERATION: Informed consent was obtained. The patient was marked on the left side and then taken to the operating room, placed supine on the operating table. General anesthesia was provided. SCDs were provided for DVT prophylaxis and IV antibiotics for bacterial prophylaxis. Foley catheter was placed to drain the bladder. OG tube was placed by anesthesia. The patient was then positioned in right lateral decubitus position with the left flank elevated about 70 degrees. All pressure points were carefully padded. The table was flexed slightly. The left arm was placed in a padded support. The patient was secured to the table with soft chest, hip, and lower extremity straps. The patient was prepped and draped in usual sterile fashion. We had a time-out confirming the patient identification, the planned procedure, the surgical site, and all present were in agreement.  A Veress needle was placed just lateral to the umbilicus. Aspiration, irrigation, and saline drop test confirmed good position. Opening pressure was less than 9 mmHg. The abdomen was insufflated to 15 mmHg. Following this,  trocar sites were mapped out with two robotic trocars triangulating the expected location of the tumor, a 12 mm trocar between, at about the midclavicular line, for the camera, 12 mm midline trocars and a 5 mm for the assistant, and an 28mm trocar in the left lower quadrant for the 4th arm.    I used a visual obturator to place a 5 mm trocar at one of the robotic port sites, and the peritoneal cavity was surveyed with no abnormalities or injuries. Remaining trocars were placed under laparoscopic visualization.   In order to accommodate each of these trochars, laparoscopic scissors were used to take down fairly significant intra-abdominal bowel adhesions to the anterior abdominal wall. Lysis of adhesions was extensive and was greater than 30 minutes in duration. Additional lysis of adhesions was performed after the robot was brought in and docked.  The robot was docked. I placed monopolar shears in the right arm, [place] bipolar in the left, and a double fenestrated grasper in the 4th arm.   The white line of Toldt was incised and the left colon was reflected. The tail of Gerota's fascia was lifted up and the ureter and gonadal vein were identified. The gonadal vein was left medial and the ureter was lifted up. The psoas muscle was identified under the ureter and I followed this plane towards the renal hilum. The hilar vessels were identified and exposed circumferentially.   At this point, I incised Gerota's fascia and defatted the kidney to expose the mass. Once the tumor was exposed, intraoperative ultrasound was performed. The  mass was imaged and measured, and then excision markings were made. 12.5 gm of mannitol and 10 mg IV lasiz were given, and then bulldog clamps were  placed on the renal artery x 2. Vein was NOT clamped. Warm ischemia time was clocked. The renal mass was sharply excised and immediately placed in a 10 mm endocatch bag.  A small area adjacent to this on the edge of  the capsular incision which was separate from the mass appeared somewhat suspicious with an orange hugh. This measured as to few millimeters. This was also excised and passed off the field as a separate specimen labeled suspicious area adjacent to the mass. The first layer of the renorrhaphy was closed with running V-Loc suture, anchored at each end with a Lapra-Ty. A sliding clip renorrhaphy was performed to close the outer layer, using interrupted 2-0 vicryl suture, Weck clips, and Lapra-Ty's. The capsule edges were nicely approximated. The bulldog clamps were removed. Warm ischemia time was 15 minutes. Additional 12.5 gm of mannitol was given. All needles were extracted. The kidney was well perfused and the renorrhaphy was hemostatic.  FloSeal as well as Tisseel was applied over the defect for additional hemostasis. Gerota's was reapproximated using a running 2-0 vicryl suture.    Floseal was placed around the hilum. Hemostasis was achieved.  A 19 round JP drain was placed lateral to the kidney. I reduced the pneumoperitoneal pressure to 7 mmHg and confirmed hemostasis throughout the surgical field. Trocars were removed under direct vision. The tumors were extracted and sent for pathology. Margins were grossly clear for both lesions. At the 12 mm trocar sites, the 0 Vicryl sutures that had been placed at the start of the case were tied down securely. The specimen was able to be brought out through one of the 12 port incisions without extending it.. All the wounds were copiously irrigated and then infiltrated with local anesthetic. The skin edges were re approximated with 4-0 Monocryl. Dermabond was applied.   All sponge, needle, and instrument counts were reported correct. The patient was awakened from anesthesia and transferred to recovery in stable condition. There were no complications and the patient tolerated the procedure well. Operative events were discussed with the patient's  family.   [name], MD

## 2015-11-14 NOTE — Interval H&P Note (Signed)
History and Physical Interval Note:  11/14/2015 9:04 AM  Julian Medina  has presented today for surgery, with the diagnosis of LEFT RENAL MASS  The various methods of treatment have been discussed with the patient and family. After consideration of risks, benefits and other options for treatment, the patient has consented to  Procedure(s): ROBOTIC ASSITED PARTIAL NEPHRECTOMY (Left) as a surgical intervention .  The patient's history has been reviewed, patient examined, no change in status, stable for surgery.  I have reviewed the patient's chart and labs.  Questions were answered to the patient's satisfaction.     [name]

## 2015-11-14 NOTE — H&P (View-Only) (Signed)
10/18/2015 2:51 PM   Julian Medina 11-Apr-1946 [id number]  Referring provider: Guadalupe Maple, MD [address] [place], [place] [address]  Chief Complaint  Patient presents with  . Renal Mass    HPI: 70 year old male with history of left renal mass previously scheduled for robotic partial nephrectomy approximately one year ago. At that time, he changed his mind and canceled his surgery. He returns today 1 year later to revisit the possibility of proceeding with surgery. In the interim, he underwent repeat imaging in the form of abdominal MRI with and without contrast on 09/21/2015.  This revealed a 2.2 x 2 x 1.7 cm enhancing left upper pole exophytic renal lesion which had increased in size from previous study (1.5 x 1.5 x 1.7 cm).  No evidence of metastatic disease or renal vein involvement.  His past medical history is significant for CLL.  His last treatment with Rituxan and Treanda September 2015 without evidence evidence of recurrence on most recent staging imaging.    He denies a history of flank pain or gross hematuria. No family history of renal cell carcinoma.  He has had previous abdominal surgery including a RIGHT inguinal hernia repair with mesh (open) as well as a large midline incision extending from the xiphoid down to the umbilicus for perforated gastric ulcer in his 48s.   Excellent functional status.  PMH: Past Medical History  Diagnosis Date  . Anxiety   . COPD (chronic obstructive pulmonary disease) [facility])     Surgical History: Past Surgical History  Procedure Laterality Date  . Hernia repair      Home Medications:    Medication List       This list is accurate as of: 10/18/15  2:51 PM.  Always use your most recent med list.               amLODipine 10 MG tablet  Commonly known as:  NORVASC  Take 1 tablet (10 mg total) by mouth daily.     benazepril 40 MG tablet  Commonly known as:  LOTENSIN  1 TABLET ONCE EACH DAY ORAL     Fluticasone-Salmeterol 250-50 MCG/DOSE Aepb  Commonly known as:  ADVAIR DISKUS  INHALE 1 PUFF 2 TIMES A DAY        Allergies:  Allergies  Allergen Reactions  . Aspirin     Family History: Family History  Problem Relation Age of Onset  . Hearing loss Brother     Social History:  reports that he has been smoking Cigarettes.  He has a 60 pack-year smoking history. He has never used smokeless tobacco. He reports that he drinks alcohol. He reports that he does not use illicit drugs.  ROS: UROLOGY Frequent Urination?: Yes Hard to postpone urination?: No Burning/pain with urination?: No Get up at night to urinate?: Yes Leakage of urine?: No Urine stream starts and stops?: No Trouble starting stream?: No Do you have to strain to urinate?: No Blood in urine?: No Urinary tract infection?: No Sexually transmitted disease?: No Injury to kidneys or bladder?: No Painful intercourse?: No Weak stream?: No Erection problems?: No Penile pain?: No  Gastrointestinal Nausea?: No Vomiting?: No Indigestion/heartburn?: No Diarrhea?: No Constipation?: No  Constitutional Fever: No Night sweats?: No Weight loss?: No Fatigue?: No  Skin Skin rash/lesions?: No Itching?: No  Eyes Blurred vision?: No Double vision?: No  Ears/Nose/Throat Sore throat?: No Sinus problems?: No  Hematologic/Lymphatic Swollen glands?: No Easy bruising?: No  Cardiovascular Leg swelling?: No Chest pain?:  No  Respiratory Cough?: No Shortness of breath?: No  Endocrine Excessive thirst?: No  Musculoskeletal Back pain?: No Joint pain?: No  Neurological Headaches?: No Dizziness?: No  Psychologic Depression?: No Anxiety?: No  Physical Exam: BP 150/88 mmHg  Pulse 85  Ht 5\' 9"  (1.753 m)  Wt 130 lb (58.968 kg)  BMI 19.19 kg/m2  Constitutional:  Alert and oriented, No acute distress. HEENT:  AT, moist mucus membranes.  Trachea midline, no masses. Cardiovascular: No clubbing,  cyanosis, or edema.  RRR. Respiratory: Normal respiratory effort, no increased work of breathing. CTAB. GI: Abdomen is soft, nontender, nondistended, no abdominal masses GU: No CVA tenderness. Midline abdominal incision and RLQ incision scar. Skin: No rashes, bruises or suspicious lesions. Lymph: No cervical or inguinal adenopathy. Neurologic: Grossly intact, no focal deficits, moving all 4 extremities. Psychiatric: Normal mood and affect.  Mildly anxious.    Laboratory Data: Lab Results  Component Value Date   WBC 8.4 08/31/2015   HGB 13.8 08/31/2015   HCT 40.2 08/31/2015   MCV 89.2 08/31/2015   PLT 195 08/31/2015    Lab Results  Component Value Date   CREATININE 1.30* 09/21/2015    Pertinent Imaging:    Study Result     CLINICAL DATA: 70 year old male with history of enhancing renal lesion in the upper pole the left kidney. Followup study.  EXAM: MRI ABDOMEN WITHOUT AND WITH CONTRAST  TECHNIQUE: Multiplanar multisequence MR imaging of the abdomen was performed both before and after the administration of intravenous contrast.  CONTRAST: 53mL MULTIHANCE GADOBENATE DIMEGLUMINE 529 MG/ML IV SOLN  COMPARISON: MRI of the abdomen 03/14/2015.  FINDINGS: Lower chest: Unremarkable.  Hepatobiliary: No cystic or solid hepatic lesions. No intra or extrahepatic biliary ductal dilatation. Gallbladder is normal in appearance.  Pancreas: No pancreatic mass. No pancreatic ductal dilatation. No pancreatic or peripancreatic fluid or inflammatory changes.  Spleen: Unremarkable.  Adrenals/Urinary Tract: Previously noted lesion in the medial aspect of the upper pole is slightly hypointense on T1 weighted images, heterogeneously T2 hyperintense (with multiple T2 hypointense internal septations and soft tissue), and demonstrates extensive post gadolinium enhancement, compatible with a small renal neoplasm. This lesion is slightly larger than the prior examination  and currently measures 1.7 x 2.0 x 2.2 cm (axial image 28 of series 9 and coronal image 37 of series 12). This lesion appears completely in capsulated within Gerota's fascia and is well separated from the left renal vein. There are again multiple other sub cm T1 hypointense, T2 hyperintense, nonenhancing lesions, compatible with tiny simple cysts. There also sub cm T1 hyperintense, T2 isointense to hypointense nonenhancing lesions in the kidneys bilaterally, compatible with tiny proteinaceous and/or hemorrhagic cysts. No hydroureteronephrosis in the visualized abdomen.  Stomach/Bowel: Visualized portions are unremarkable.  Vascular/Lymphatic: No aneurysm identified in the visualized abdominal vasculature. Left renal vein is widely patent. No lymphadenopathy noted in the abdomen.  Other: No significant volume of ascites in the visualized abdomen.  Musculoskeletal: No aggressive osseous lesions are noted in the visualized portions of the skeleton.  IMPRESSION: 1. Enlargement of an enhancing the lesion in the upper pole of the left kidney, which currently measures 1.7 x 2.0 x 2.2 cm and remains compatible with a small renal neoplasm, likely a renal cell carcinoma. Urologic consultation is recommended if not artery obtained. 2. Multiple small simple cysts and proteinaceous/hemorrhagic cysts in the kidneys bilaterally are similar to the prior examination.   Electronically Signed  By: Vinnie Langton M.D.  On: 09/21/2015 09:34    MRI  abd reviewed personally today and with the patient  Assessment & Plan:  70 yo M with enhancing exophytic left upper pole renal mass now measuring 2.2 cm gradually increasing in size highly concerning for RCC.    1. Left renal mass  A solid renal mass raises the suspicion of primary renal malignancy. We discussed this in detail and in regards to the spectrum of renal masses which includes cysts (pure cysts are considered benign), solid  masses and everything in between. The risk of metastasis increases as the size of solid renal mass increases. In general, it is believed that the risk of metastasis for renal masses less than 3-4 cm is small (up to approximately 5%) based mainly on large retrospective studies. In some cases and especially in patients of older age and multiple comorbidities a surveillance approach may be appropriate. The treatment of solid renal masses includes: surveillance, cryoablation (percutaneous and laparoscopic) in addition to partial and complete nephrectomy (each with option of laparoscopic, robotic and open depending on appropriateness). Furthermore, nephrectomy appears to be an independent risk factor for the development of chronic kidney disease suggesting that nephron sparing approaches should be implored whenever feasible. We reviewed these options in context of the patients current situation as well as the pros and cons of each. We also discussed the role of biopsy today and given the appearance on imaging, I suspect this lesion has approximately 80-90% chance of representing a malignancy. He is most interested in pursuing robotic partial nephrectomy for excision of the small lesion. We discussed the risks of the procedure including risk of significant blood loss including possible need for blood transfusion, urine leak, need for conversion to open procedure, or radical nephrectomy, damage to bowel or surrounding structures, need for posttoperative hospitalization proximally 2 nights, need for postooperative drain and Foley catheter.   All of his questions were answered today in detail.  2. CKD (chronic kidney disease) stage 3, GFR 30-59 ml/min Baseline Cr 1.3, prefer to proceed with nephron sparing surgery.  3. Chronic lymphocytic leukemia ([place]) NED.  Stable. No treatment needed since 2015.   [name], MD  [facility] [address], [place], [place]  [address] [phone]  I spent 45 min with this patient of which greater than 50% was spent in counseling and coordination of care with the patient. All of his questions were answered and options reviewed in detail.

## 2015-11-14 NOTE — OR Nursing (Signed)
Sacral pad sent to OR

## 2015-11-15 ENCOUNTER — Encounter: Payer: Self-pay | Admitting: Urology

## 2015-11-15 LAB — TYPE AND SCREEN
ABO/RH(D): O NEG
Antibody Screen: NEGATIVE
UNIT DIVISION: 0
Unit division: 0

## 2015-11-15 LAB — CBC
HEMATOCRIT: 32.4 % — AB (ref 40.0–52.0)
HEMOGLOBIN: 11 g/dL — AB (ref 13.0–18.0)
MCH: 30 pg (ref 26.0–34.0)
MCHC: 33.9 g/dL (ref 32.0–36.0)
MCV: 88.5 fL (ref 80.0–100.0)
Platelets: 135 10*3/uL — ABNORMAL LOW (ref 150–440)
RBC: 3.66 MIL/uL — ABNORMAL LOW (ref 4.40–5.90)
RDW: 15.6 % — ABNORMAL HIGH (ref 11.5–14.5)
WBC: 9.4 10*3/uL (ref 3.8–10.6)

## 2015-11-15 LAB — BASIC METABOLIC PANEL
Anion gap: 7 (ref 5–15)
BUN: 13 mg/dL (ref 6–20)
CHLORIDE: 109 mmol/L (ref 101–111)
CO2: 23 mmol/L (ref 22–32)
CREATININE: 1.23 mg/dL (ref 0.61–1.24)
Calcium: 8.5 mg/dL — ABNORMAL LOW (ref 8.9–10.3)
GFR calc non Af Amer: 59 mL/min — ABNORMAL LOW (ref >60)
GLUCOSE: 110 mg/dL — AB (ref 65–99)
Potassium: 3.7 mmol/L (ref 3.5–5.1)
Sodium: 139 mmol/L (ref 135–145)

## 2015-11-15 LAB — CREATININE, FLUID (PLEURAL, PERITONEAL, JP DRAINAGE): Creat, Fluid: 1.3 mg/dL

## 2015-11-15 NOTE — Care Management Note (Signed)
Case Management Note  Patient Details  Name: Julian Medina MRN: [id number] Date of Birth: 02-11-1946  Subjective/Objective:     70 y.o. M admitted 11/14/2015 for L Partial Nephrectomy. Ambulatory home with wife.                Action/Plan: No CM needs at this time, will be available should CM needs arise.    Expected Discharge Date:                  Expected Discharge Plan:     In-House Referral:     Discharge planning Services  CM Consult  Post Acute Care Choice:    Choice offered to:  Patient  DME Arranged:    DME Agency:     HH Arranged:    [place] Agency:     Status of Service:  Completed, signed off  Medicare Important Message Given:    Date Medicare IM Given:    Medicare IM give by:    Date Additional Medicare IM Given:    Additional Medicare Important Message give by:     If discussed at [place] of Stay Meetings, dates discussed:    Additional Comments:  Delrae Sawyers, RN 11/15/2015, 1:49 PM

## 2015-11-15 NOTE — Progress Notes (Signed)
1 Day Post-Op Subjective: The patient is doing well.  No nausea or vomiting. Pain is adequately controlled.  Objective: Vital signs in last 24 hours: Temp:  [97 F (36.1 C)-99.1 F (37.3 C)] 98.6 F (37 C) (05/03 [id number]) Pulse Rate:  [61-84] 76 (05/03 0611) Resp:  [11-20] 20 (05/03 0611) BP: (102-140)/(47-84) 102/51 mmHg (05/03 0611) SpO2:  [98 %-100 %] 98 % (05/03 0611) Weight:  [114 lb 14.4 oz (52.118 kg)-130 lb (58.968 kg)] 114 lb 14.4 oz (52.118 kg) (05/02 1747)  Intake/Output from previous day: 05/02 0701 - 05/03 0700 In: 3535.5 [I.V.:3535.5] Out: 2550 [Urine:2400; Drains:100; Blood:50] Intake/Output this shift:    Physical Exam:  General: Alert and oriented. CV: RRR Lungs: Clear bilaterally. GI: Soft, Nondistended. Incisions: Clean and dry. Urine: Clear, Foley in place  JP drain with serousang drainage.   Extremities: Nontender, no erythema, no edema.  Lab Results:  Recent Labs  11/14/15 1544 11/15/15 0331  HGB 13.3 11.0*  HCT 39.3* 32.4*          Recent Labs  11/14/15 1544 11/15/15 0331  CREATININE 1.17 1.23           Results for orders placed or performed during the hospital encounter of 11/14/15 (from the past 24 hour(s))  Prepare RBC (crossmatch)     Status: None   Collection Time: 11/14/15  9:00 AM  Result Value Ref Range   Order Confirmation ORDER PROCESSED BY BLOOD BANK   CBC     Status: Abnormal   Collection Time: 11/14/15  3:44 PM  Result Value Ref Range   WBC 15.1 (H) 3.8 - 10.6 K/uL   RBC 4.28 (L) 4.40 - 5.90 MIL/uL   Hemoglobin 13.3 13.0 - 18.0 g/dL   HCT 39.3 (L) 40.0 - 52.0 %   MCV 91.7 80.0 - 100.0 fL   MCH 31.0 26.0 - 34.0 pg   MCHC 33.7 32.0 - 36.0 g/dL   RDW 15.6 (H) 11.5 - 14.5 %   Platelets 121 (L) 150 - 440 K/uL  Basic metabolic panel     Status: Abnormal   Collection Time: 11/14/15  3:44 PM  Result Value Ref Range   Sodium 133 (L) 135 - 145 mmol/L   Potassium 4.6 3.5 - 5.1 mmol/L   Chloride 103 101 - 111 mmol/L   CO2  20 (L) 22 - 32 mmol/L   Glucose, Bld 151 (H) 65 - 99 mg/dL   BUN 15 6 - 20 mg/dL   Creatinine, Ser 1.17 0.61 - 1.24 mg/dL   Calcium 8.6 (L) 8.9 - 10.3 mg/dL   GFR calc non Af Amer >60 >60 mL/min   GFR calc Af Amer >60 >60 mL/min   Anion gap 10 5 - 15  CBC     Status: Abnormal   Collection Time: 11/15/15  3:31 AM  Result Value Ref Range   WBC 9.4 3.8 - 10.6 K/uL   RBC 3.66 (L) 4.40 - 5.90 MIL/uL   Hemoglobin 11.0 (L) 13.0 - 18.0 g/dL   HCT 32.4 (L) 40.0 - 52.0 %   MCV 88.5 80.0 - 100.0 fL   MCH 30.0 26.0 - 34.0 pg   MCHC 33.9 32.0 - 36.0 g/dL   RDW 15.6 (H) 11.5 - 14.5 %   Platelets 135 (L) 150 - 440 K/uL  Basic metabolic panel     Status: Abnormal   Collection Time: 11/15/15  3:31 AM  Result Value Ref Range   Sodium 139 135 - 145 mmol/L   Potassium  3.7 3.5 - 5.1 mmol/L   Chloride 109 101 - 111 mmol/L   CO2 23 22 - 32 mmol/L   Glucose, Bld 110 (H) 65 - 99 mg/dL   BUN 13 6 - 20 mg/dL   Creatinine, Ser 1.23 0.61 - 1.24 mg/dL   Calcium 8.5 (L) 8.9 - 10.3 mg/dL   GFR calc non Af Amer 59 (L) >60 mL/min   GFR calc Af Amer >60 >60 mL/min   Anion gap 7 5 - 15    Assessment/Plan: POD# 1 s/p robotic partial nephrectomy.  1) Ambulate, Incentive spirometry 2) Advance diet as tolerated 3) Transition to oral pain medication 4) D/C Foley 5) Drain Cr today   [name], MD   LOS: 1 day   [name] 11/15/2015, 8:03 AM

## 2015-11-15 NOTE — Progress Notes (Signed)
Initial Nutrition Assessment  DOCUMENTATION CODES:   Severe malnutrition in context of acute illness/injury  INTERVENTION:  -Monitor intake and cater to pt preferences. Discussed meal strategies to increase kcals and protein with pt and wife. Verbalized understanding -Recommend boost/ensure BID but pt reports causes diarrhea. May benefit from boost breeze BID (clear liquid supplement)   NUTRITION DIAGNOSIS:   Increased nutrient needs related to cancer and cancer related treatments as evidenced by estimated needs.    GOAL:   Patient will meet greater than or equal to 90% of their needs    MONITOR:   PO intake  REASON FOR ASSESSMENT:    (low BMI)    ASSESSMENT:   70 y/o male admitted with left renal mass, partial nephrectomy  Past Medical History  Diagnosis Date  . Anxiety   . COPD (chronic obstructive pulmonary disease) ([place])   . CLL (chronic lymphocytic leukemia) ([facility])     Pt reports appetite has been fairly normal, eating 2 meals per day (breakfast 2 eggs, bacon, grits and dinner meat and 2 vegetables). Reports ate well at lunch and could have ate more but wanted to take it easy after surgery  Medications reviewed colace, NS at 119ml/hr Labs reviewed  Nutrition-Focused physical exam completed. Findings are moderate fat depletion, moderate to severe muscle depletion, and no edema.     Diet Order:  Diet regular Room service appropriate?: Yes; Fluid consistency:: Thin  Skin:  Reviewed, no issues  Last BM:  5/1  Height:   Ht Readings from Last 1 Encounters:  11/14/15 5\' 11"  (1.803 m)    Weight: 12% wt loss in the last month  Wt Readings from Last 1 Encounters:  11/14/15 114 lb 14.4 oz (52.118 kg)    Ideal Body Weight:     BMI:  Body mass index is 16.03 kg/(m^2).  Estimated Nutritional Needs:   Kcal:  [id number] kcals/d.   Protein:  78-91 g/d  Fluid:  1.5-1.8 L/d  EDUCATION NEEDS:   Education needs addressed  Kashira Behunin B. Zenia Resides, [place],  [place] (pager) Weekend/On-Call pager [phone])

## 2015-11-15 NOTE — Progress Notes (Signed)
Foley removed per MD order.  

## 2015-11-16 ENCOUNTER — Telehealth: Payer: Self-pay

## 2015-11-16 LAB — CBC
HEMATOCRIT: 31.5 % — AB (ref 40.0–52.0)
Hemoglobin: 10.8 g/dL — ABNORMAL LOW (ref 13.0–18.0)
MCH: 30.8 pg (ref 26.0–34.0)
MCHC: 34.4 g/dL (ref 32.0–36.0)
MCV: 89.8 fL (ref 80.0–100.0)
Platelets: 113 10*3/uL — ABNORMAL LOW (ref 150–440)
RBC: 3.51 MIL/uL — AB (ref 4.40–5.90)
RDW: 15.6 % — AB (ref 11.5–14.5)
WBC: 9 10*3/uL (ref 3.8–10.6)

## 2015-11-16 LAB — SURGICAL PATHOLOGY

## 2015-11-16 MED ORDER — DOCUSATE SODIUM 100 MG PO CAPS: 100.0000 mg | ORAL_CAPSULE | Freq: Two times a day (BID) | ORAL | Status: DC

## 2015-11-16 MED ORDER — OXYCODONE-ACETAMINOPHEN 5-325 MG PO TABS: 1.0000 | ORAL_TABLET | ORAL | Status: DC | PRN

## 2015-11-16 NOTE — Discharge Instructions (Signed)
·   Activity:  You are encouraged to ambulate frequently (about every hour during waking hours) to help prevent blood clots from forming in your legs or lungs.  However, you should not engage in any heavy lifting (> 5-10 lbs), strenuous activity, or straining. ° °· Diet: You should advance your diet as instructed by your physician.  It will be normal to have some bloating, nausea, and abdominal discomfort intermittently. ° °· Prescriptions:  You will be provided a prescription for pain medication to take as needed.  If your pain is not severe enough to require the prescription pain medication, you may take extra strength Tylenol instead which will have less side effects.  You should also take a prescribed stool softener to avoid straining with bowel movements as the prescription pain medication may constipate you. ° °· Incisions: You may remove your dressing bandages 48 hours after surgery if not removed in the hospital.  You will either have some small staples or special tissue glue at each of the incision sites. Once the bandages are removed (if present), the incisions may stay open to air.  You may start showering (but not soaking or bathing in water) the 2nd day after surgery and the incisions simply need to be patted dry after the shower.  No additional care is needed. ° °What to call us about: You should call the office if you develop fever > 101 or develop persistent vomiting, redness or draining around your incision, or any other concerning symptoms.   ° °[facility] °[address], [address] °[place], [place] [address] °[phone] ° ° °

## 2015-11-16 NOTE — Discharge Summary (Addendum)
Date of admission: 11/14/2015  Date of discharge: 11/16/2015  Admission diagnosis: Left renal mass   Discharge diagnosis: Left renal mass  Secondary diagnoses:  Patient Active Problem List   Diagnosis Date Noted  . Left renal mass 11/14/2015  . Anxiety 10/26/2015  . Essential hypertension 05/04/2015  . COPD (chronic obstructive pulmonary disease) ([place]) 05/04/2015  . Chronic lymphocytic leukemia ([place]) 05/04/2015  . Renal mass of unknown nature 05/04/2015  . Positional vertigo 05/04/2015  . BPH (benign prostatic hyperplasia) 05/04/2015    History and Physical: For full details, please see admission history and physical. Briefly, Julian Medina is a 70 y.o. year old patient with incidental left renal mass admitted following robotic left partial nephrectomy, LOA.    Hospital Course: Patient tolerated the procedure well.  He was then transferred to the floor after an uneventful PACU stay.  His hospital course was uncomplicated.  On POD#*2 he had met discharge criteria: was eating a regular diet, passing flatus, was up and ambulating independently,  pain was well controlled, was voiding without a catheter, and was ready to for discharge.  His drain was removed as well prior to discharge.    Physical Exam:  Blood pressure 121/59, pulse 74, temperature 98 F (36.7 C), temperature source Oral, resp. rate 18, height _0  (1.803 m), weight 114 lb 14.4 oz (52.118 kg), SpO2 98 %. General: Alert and oriented. CV: RRR Lungs: Clear bilaterally. GI: Soft, Nondistended. Incisions: Clean and dry. Extremities: Nontender, no erythema, no edema.  Laboratory values:   Recent Labs  11/14/15 1544 11/15/15 0331 11/16/15 0408  WBC 15.1* 9.4 9.0  HGB 13.3 11.0* 10.8*  HCT 39.3* 32.4* 31.5*    Recent Labs  11/14/15 1544 11/15/15 0331  NA 133* 139  K 4.6 3.7  CL 103 109  CO2 20* 23  GLUCOSE 151* 110*  BUN 15 13  CREATININE 1.17 1.23  CALCIUM 8.6* 8.5*   No results for input(s): LABPT, INR  in the last 72 hours. No results for input(s): LABURIN in the last 72 hours. Results for orders placed or performed during the hospital encounter of 10/31/15  Urine culture     Status: None   Collection Time: 10/31/15  8:48 AM  Result Value Ref Range Status   Specimen Description URINE, CLEAN CATCH  Final   Special Requests NONE  Final   Culture NO GROWTH 1 DAY  Final   Report Status 11/01/2015 FINAL  Final    Disposition: Home  Discharge instruction: The patient was instructed to be ambulatory but told to refrain from heavy lifting, strenuous activity, or driving while taking narcotics.  Discharge medications:   Medication List    TAKE these medications        amLODipine 10 MG tablet  Commonly known as:  NORVASC  Take 1 tablet (10 mg total) by mouth daily.     azithromycin 250 MG tablet  Commonly known as:  ZITHROMAX  2 now then 1 a day     benazepril 40 MG tablet  Commonly known as:  LOTENSIN  1 TABLET ONCE EACH DAY ORAL     docusate sodium 100 MG capsule  Commonly known as:  COLACE  Take 1 capsule (100 mg total) by mouth 2 (two) times daily.     Fluticasone-Salmeterol 250-50 MCG/DOSE Aepb  Commonly known as:  ADVAIR DISKUS  INHALE 1 PUFF 2 TIMES A DAY     LORazepam 1 MG tablet  Commonly known as:  ATIVAN  Take 1  tablet (1 mg total) by mouth daily as needed for anxiety. 1/2 to 1 tab     oxyCODONE-acetaminophen 5-325 MG tablet  Commonly known as:  PERCOCET  Take 1-2 tablets by mouth every 4 (four) hours as needed for moderate pain or severe pain.        Followup:      Follow-up Information    Follow up with [name], MD In 4 weeks.   Specialty:  Urology   Why:  For wound re-check   Contact information:   [place] [address] [phone]

## 2015-11-16 NOTE — Progress Notes (Signed)
IV was removed. Discharge instructions, follow-up appointments, and prescriptions were provided to the pt and wife at bedside. All questions were answered. The pt was taken downstairs via wheelchair.

## 2015-11-16 NOTE — Progress Notes (Signed)
JP drain removed per MD order.

## 2015-11-16 NOTE — Telephone Encounter (Signed)
Dr. Dicie Beam pathology would like you to call her in regards to patient at [phone]

## 2015-11-16 NOTE — Anesthesia Postprocedure Evaluation (Signed)
Anesthesia Post Note  Patient: Rien L Borneman  Procedure(s) Performed: Procedure(s) (LRB): ROBOTIC ASSITED PARTIAL NEPHRECTOMY with intraoperative drop in ultrasound / Extensive Lysis of adhesions (Left)  Patient location during evaluation: PACU Anesthesia Type: General Level of consciousness: awake and alert Pain management: pain level controlled Vital Signs Assessment: post-procedure vital signs reviewed and stable Respiratory status: spontaneous breathing, nonlabored ventilation, respiratory function stable and patient connected to nasal cannula oxygen Cardiovascular status: blood pressure returned to baseline and stable Postop Assessment: no signs of nausea or vomiting Anesthetic complications: no    Last Vitals:  Filed Vitals:   11/15/15 1830 11/15/15 2108  BP: 108/61 110/57  Pulse: 78 72  Temp: 36.8 C 36.7 C  Resp: 17 19    Last Pain:  Filed Vitals:   11/15/15 2205  PainSc: 3                  Precious Haws Piscitello

## 2015-11-28 ENCOUNTER — Inpatient Hospital Stay: Payer: Managed Care, Other (non HMO) | Attending: Oncology

## 2015-12-15 ENCOUNTER — Encounter: Payer: Self-pay | Admitting: Urology

## 2015-12-15 ENCOUNTER — Ambulatory Visit (INDEPENDENT_AMBULATORY_CARE_PROVIDER_SITE_OTHER): Payer: Managed Care, Other (non HMO) | Admitting: Urology

## 2015-12-15 VITALS — BP 159/89 | HR 69 | Ht 71.0 in | Wt 127.5 lb

## 2015-12-15 DIAGNOSIS — C642 Malignant neoplasm of left kidney, except renal pelvis: Secondary | ICD-10-CM

## 2015-12-15 DIAGNOSIS — N189 Chronic kidney disease, unspecified: Secondary | ICD-10-CM

## 2015-12-15 NOTE — Progress Notes (Signed)
12/15/2015 9:13 AM   [place] Medina 1946-04-09 [id number]  Referring provider: Guadalupe Maple, MD [address] [place], [place] [address]  Chief Complaint  Patient presents with  . Routine Post Op    wound check     HPI: 70 yo M with left renal mass s/p left robotic partial nephrectomy on 11/14/2015. His hospital course was uncomplicated.    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   Fuhrman grade 2. Margins grossly negative at the time of resection, possible iatrogenic positive margin secondary specimen fracture the time of extraction.  Postop, he's had no issues. He still little sore in his abdomen. He slowly getting back to his normal activities.  He is voiding well. No dysuria or gross hematuria.   PMH: Past Medical History  Diagnosis Date  . Anxiety   . COPD (chronic obstructive pulmonary disease) ([place])   . CLL (chronic lymphocytic leukemia) ([place])     Surgical History: Past Surgical History  Procedure Laterality Date  . Hernia repair    . Robotic assited partial nephrectomy Left 11/14/2015    Procedure: ROBOTIC ASSITED PARTIAL NEPHRECTOMY with intraoperative drop in ultrasound / Extensive Lysis of adhesions;  Surgeon: [name], MD;  Location: [facility] ORS;  Service: Urology;  Laterality: Left;    Home Medications:    Medication List       This list is accurate as of: 12/15/15  9:13 AM.  Always use your most recent med list.               amLODipine 10 MG tablet  Commonly known as:  NORVASC  Take 1 tablet (10 mg total) by mouth daily.     benazepril 40 MG tablet  Commonly known as:  LOTENSIN  1 TABLET ONCE EACH DAY ORAL     docusate sodium 100 MG capsule  Commonly known as:  COLACE  Take 1 capsule (100 mg total) by mouth 2 (two) times daily.     Fluticasone-Salmeterol 250-50 MCG/DOSE Aepb  Commonly known as:  ADVAIR DISKUS  INHALE 1 PUFF 2 TIMES A DAY     LORazepam 1 MG tablet    Commonly known as:  ATIVAN  Take 1 tablet (1 mg total) by mouth daily as needed for anxiety. 1/2 to 1 tab     oxyCODONE-acetaminophen 5-325 MG tablet  Commonly known as:  PERCOCET  Take 1-2 tablets by mouth every 4 (four) hours as needed for moderate pain or severe pain.        Allergies:  Allergies  Allergen Reactions  . Aspirin Other (See Comments)    Ulcer     Family History: Family History  Problem Relation Age of Onset  . Hearing loss Brother     Social History:  reports that he has been smoking Cigarettes.  He has a 60 pack-year smoking history. He has never used smokeless tobacco. He reports that he drinks about 4.2 oz of alcohol per week. He reports that he does not use illicit drugs.  ROS: UROLOGY Frequent Urination?: Yes Hard to postpone urination?: No Burning/pain with urination?: No Get up at night to urinate?: Yes Leakage of urine?: No Urine stream starts and stops?: No Trouble starting stream?: No Do you have to strain to urinate?: No Blood in urine?: No Urinary tract infection?: No Sexually transmitted disease?: No Injury to kidneys or bladder?: No Painful intercourse?: No Weak stream?: No Erection problems?: No Penile  pain?: No  Gastrointestinal Nausea?: No Vomiting?: No Indigestion/heartburn?: No Diarrhea?: No Constipation?: No  Constitutional Fever: No Night sweats?: No Weight loss?: No Fatigue?: No  Skin Skin rash/lesions?: No Itching?: No  Eyes Blurred vision?: No Double vision?: No  Ears/Nose/Throat Sore throat?: No Sinus problems?: No  Hematologic/Lymphatic Swollen glands?: No Easy bruising?: No  Cardiovascular Leg swelling?: No Chest pain?: No  Respiratory Cough?: No Shortness of breath?: Yes  Endocrine Excessive thirst?: No  Musculoskeletal Back pain?: No Joint pain?: No  Neurological Headaches?: No Dizziness?: No  Psychologic Depression?: No Anxiety?: No  Physical Exam: BP 159/89 mmHg  Pulse  69  Ht 5\' 11"  (1.803 m)  Wt 127 lb 8 oz (57.834 kg)  BMI 17.79 kg/m2  Constitutional:  Alert and oriented, No acute distress.  Presents to the office today with his wife. HEENT: [place] AT, moist mucus membranes.  Trachea midline, no masses. Cardiovascular: No clubbing, cyanosis, or edema. Respiratory: Normal respiratory effort, no increased work of breathing. GI: Abdomen is soft, nontender, nondistended, no abdominal masses.  Surgical incisions clean and dry and intact. Small subcutaneous nodular density under each of his trocar port sites, likely keloid formation. GU: No CVA tenderness. Skin: No rashes, bruises or suspicious lesions. Neurologic: Grossly intact, no focal deficits, moving all 4 extremities. Psychiatric: Normal mood and affect.  Laboratory Data: Lab Results  Component Value Date   WBC 9.0 11/16/2015   HGB 10.8* 11/16/2015   HCT 31.5* 11/16/2015   MCV 89.8 11/16/2015   PLT 113* 11/16/2015    Lab Results  Component Value Date   CREATININE 1.23 11/15/2015    Assessment & Plan:    1. Renal cell adenocarcinoma, left (HCC) pT1 multifocal left RCC status post uncomplicated robotic partial nephrectomy Recommend follow-up with CT abdomen with and without contrast in 6 months - CT Abd Wo & W Cm; Future  2. CKD (chronic kidney disease), unspecified stage Plan to recheck labs today to ensure creatinine stable, baseline around 1.3. - CBC - Basic metabolic panel   Return in about 6 months (around 06/15/2016) for f/u CT scan.  [name], MD  [facility] [address] [place],  [address] [phone]

## 2015-12-16 LAB — CBC
HEMOGLOBIN: 13 g/dL (ref 12.6–17.7)
Hematocrit: 38.8 % (ref 37.5–51.0)
MCH: 28.9 pg (ref 26.6–33.0)
MCHC: 33.5 g/dL (ref 31.5–35.7)
MCV: 86 fL (ref 79–97)
Platelets: 183 10*3/uL (ref 150–379)
RBC: 4.5 x10E6/uL (ref 4.14–5.80)
RDW: 15.1 % (ref 12.3–15.4)
WBC: 7.6 10*3/uL (ref 3.4–10.8)

## 2015-12-16 LAB — BASIC METABOLIC PANEL
BUN/Creatinine Ratio: 9 — ABNORMAL LOW (ref 10–24)
BUN: 12 mg/dL (ref 8–27)
CO2: 19 mmol/L (ref 18–29)
Calcium: 9.8 mg/dL (ref 8.6–10.2)
Chloride: 100 mmol/L (ref 96–106)
Creatinine, Ser: 1.36 mg/dL — ABNORMAL HIGH (ref 0.76–1.27)
GFR calc Af Amer: 61 mL/min/{1.73_m2} (ref >59)
GFR calc non Af Amer: 53 mL/min/{1.73_m2} — ABNORMAL LOW (ref >59)
GLUCOSE: 79 mg/dL (ref 65–99)
Potassium: 4.3 mmol/L (ref 3.5–5.2)
SODIUM: 140 mmol/L (ref 134–144)

## 2015-12-18 ENCOUNTER — Telehealth: Payer: Self-pay

## 2015-12-18 NOTE — Telephone Encounter (Signed)
Spoke with pt in reference to lab results. Pt voiced understanding.  

## 2015-12-18 NOTE — Telephone Encounter (Signed)
-----   Message from [name], MD sent at 12/17/2015  1:47 PM EDT ----- Labs look great.  Renal function is stable from preop and no anemia!  [name], MD

## 2016-02-12 NOTE — Progress Notes (Signed)
[facility]  [phone] Fax:([phone]) [phone]  ID: [place] Medina OB: July 21, 1945  MR#: [id number]  [id number]  Patient Care Team: Guadalupe Maple, MD as PCP - General (Family Medicine) Lloyd Huger, MD as Consulting Physician (Oncology) [name], MD as Consulting Physician (Urology)  CHIEF COMPLAINT: CLL  INTERVAL HISTORY: Patient returns to clinic today for further evaluation and laboratory work.  He has noticed increasing lymphadenopathy in his bilateral axilla. He otherwise has felt well and is asymptomatic. He continues to be highly anxious. He denies any recent fevers, night sweats, or weight loss. He has no neurologic complaints. He denies any pain. He has no chest pain or shortness of breath. He denies any nausea, vomiting, constipation, or diarrhea. He has no urinary complaints. Patient offers no specific complaints today.   REVIEW OF SYSTEMS:   Review of Systems  Constitutional: Negative for diaphoresis, fever, malaise/fatigue and weight loss.  Respiratory: Negative.  Negative for cough and shortness of breath.   Cardiovascular: Negative.  Negative for chest pain.  Gastrointestinal: Negative.  Negative for abdominal pain.  Genitourinary: Negative.   Musculoskeletal: Negative.   Neurological: Negative.  Negative for weakness.  Psychiatric/Behavioral: The patient is nervous/anxious.     As per HPI. Otherwise, a complete review of systems is negatve.  PAST MEDICAL HISTORY: Past Medical History:  Diagnosis Date  . Anxiety   . CLL (chronic lymphocytic leukemia) ([place])   . COPD (chronic obstructive pulmonary disease) ([place])     PAST SURGICAL HISTORY: Past Surgical History:  Procedure Laterality Date  . HERNIA REPAIR    . ROBOTIC ASSITED PARTIAL NEPHRECTOMY Left 11/14/2015   Procedure: ROBOTIC ASSITED PARTIAL NEPHRECTOMY with intraoperative drop in ultrasound / Extensive Lysis of adhesions;  Surgeon: [name], MD;  Location:  [facility] ORS;  Service: Urology;  Laterality: Left;    FAMILY HISTORY: Heart disease and hypertension.     ADVANCED DIRECTIVES:    HEALTH MAINTENANCE: Social History  Substance Use Topics  . Smoking status: Current Every Day Smoker    Packs/day: 1.50    Years: 40.00    Types: Cigarettes  . Smokeless tobacco: Never Used  . Alcohol use 4.2 oz/week    7 Cans of beer per week     Comment: occasional     Colonoscopy:  PAP:  Bone density:  Lipid panel:  Allergies  Allergen Reactions  . Aspirin Other (See Comments)    Ulcer     Current Outpatient Prescriptions  Medication Sig Dispense Refill  . amLODipine (NORVASC) 10 MG tablet Take 1 tablet (10 mg total) by mouth daily. (Patient taking differently: Take 10 mg by mouth at bedtime. ) 90 tablet 4  . benazepril (LOTENSIN) 40 MG tablet 1 TABLET ONCE EACH DAY ORAL (Patient taking differently: Take 40 mg by mouth at bedtime. 1 TABLET ONCE EACH DAY ORAL) 90 tablet 4  . Fluticasone-Salmeterol (ADVAIR DISKUS) 250-50 MCG/DOSE AEPB INHALE 1 PUFF 2 TIMES A DAY 180 each 4  . LORazepam (ATIVAN) 1 MG tablet Take 1 tablet (1 mg total) by mouth daily as needed for anxiety. 1/2 to 1 tab (Patient not taking: Reported on 02/13/2016) 30 tablet 1   No current facility-administered medications for this visit.     OBJECTIVE: Vitals:   02/13/16 1405  BP: 130/80  Pulse: 91  Resp: 17  Temp: 97.3 F (36.3 C)     Body mass index is 18.1 kg/m.    ECOG FS:0 - Asymptomatic  General: Well-developed, well-nourished, no acute  distress. Eyes: Pink conjunctiva, anicteric sclera. Lungs: Clear to auscultation bilaterally. Heart: Regular rate and rhythm. No rubs, murmurs, or gallops. Abdomen: Soft, nontender, nondistended. No organomegaly noted, normoactive bowel sounds. Musculoskeletal: No edema, cyanosis, or clubbing. Neuro: Alert, answering all questions appropriately. Cranial nerves grossly intact. Skin: No rashes or petechiae noted. Psych: Normal  affect. Lymphatics: Bilateral 2-3 cm easily palpable lymph nodes in axilla. No other palpable lymphadenopathy.   LAB RESULTS:  Lab Results  Component Value Date   NA 140 12/15/2015   K 4.3 12/15/2015   CL 100 12/15/2015   CO2 19 12/15/2015   GLUCOSE 79 12/15/2015   BUN 12 12/15/2015   CREATININE 1.36 (H) 12/15/2015   CALCIUM 9.8 12/15/2015   PROT 6.7 05/04/2015   ALBUMIN 4.9 (H) 05/04/2015   AST 19 05/04/2015   ALT 13 05/04/2015   ALKPHOS 66 05/04/2015   BILITOT 0.5 05/04/2015   GFRNONAA 53 (L) 12/15/2015   GFRAA 61 12/15/2015    Lab Results  Component Value Date   WBC 4.5 02/13/2016   NEUTROABS 1.5 02/13/2016   HGB 10.9 (L) 02/13/2016   HCT 30.8 (L) 02/13/2016   MCV 87.9 02/13/2016   PLT 170 02/13/2016     STUDIES: No results found.  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   PLAN:    1.  CLL: Flow cytometry confirmed the diagnosis.  Patient is also ZAP-70 positive this which indicates it may be a more aggressive form of CLL.  Patient now has easily palpable axillary lymphadenopathy, therefore will order a CT scan of neck, chest, abdomen, and pelvis in the next several weeks. Patient will return to clinic one to 2 days after his scan to discuss the results and treatment planning if necessary. He last received treatment with Rituxan and Treanda in September 2015.   2.  Anxiety: Continue Xanax PRN. 3.  Renal cell carcinoma: Patient is status post partial nephrectomy on Nov 14, 2015. Continued follow-up per urology.    Patient expressed understanding and was in agreement with this plan. He also understands that He can call clinic at any time with any questions, concerns, or complaints.    Lloyd Huger, MD   02/14/2016 10:06 PM

## 2016-02-13 ENCOUNTER — Encounter: Payer: Self-pay | Admitting: Oncology

## 2016-02-13 ENCOUNTER — Inpatient Hospital Stay: Payer: Managed Care, Other (non HMO)

## 2016-02-13 ENCOUNTER — Other Ambulatory Visit: Payer: Self-pay

## 2016-02-13 ENCOUNTER — Inpatient Hospital Stay: Payer: Managed Care, Other (non HMO) | Attending: Oncology | Admitting: Oncology

## 2016-02-13 VITALS — BP 130/80 | HR 91 | Temp 97.3°F | Resp 17 | Ht 71.0 in | Wt 129.7 lb

## 2016-02-13 DIAGNOSIS — F17210 Nicotine dependence, cigarettes, uncomplicated: Secondary | ICD-10-CM | POA: Diagnosis not present

## 2016-02-13 DIAGNOSIS — Z79899 Other long term (current) drug therapy: Secondary | ICD-10-CM | POA: Insufficient documentation

## 2016-02-13 DIAGNOSIS — J449 Chronic obstructive pulmonary disease, unspecified: Secondary | ICD-10-CM | POA: Diagnosis not present

## 2016-02-13 DIAGNOSIS — I10 Essential (primary) hypertension: Secondary | ICD-10-CM | POA: Diagnosis not present

## 2016-02-13 DIAGNOSIS — C649 Malignant neoplasm of unspecified kidney, except renal pelvis: Secondary | ICD-10-CM

## 2016-02-13 DIAGNOSIS — R591 Generalized enlarged lymph nodes: Secondary | ICD-10-CM | POA: Insufficient documentation

## 2016-02-13 DIAGNOSIS — C9110 Chronic lymphocytic leukemia of B-cell type not having achieved remission: Secondary | ICD-10-CM | POA: Diagnosis not present

## 2016-02-13 DIAGNOSIS — C642 Malignant neoplasm of left kidney, except renal pelvis: Secondary | ICD-10-CM

## 2016-02-13 DIAGNOSIS — F419 Anxiety disorder, unspecified: Secondary | ICD-10-CM | POA: Insufficient documentation

## 2016-02-13 DIAGNOSIS — Z87891 Personal history of nicotine dependence: Secondary | ICD-10-CM

## 2016-02-13 LAB — CBC WITH DIFFERENTIAL/PLATELET
BASOS PCT: 2 %
Basophils Absolute: 0.1 10*3/uL (ref 0–0.1)
EOS ABS: 0.2 10*3/uL (ref 0–0.7)
EOS PCT: 4 %
HCT: 30.8 % — ABNORMAL LOW (ref 40.0–52.0)
Hemoglobin: 10.9 g/dL — ABNORMAL LOW (ref 13.0–18.0)
LYMPHS ABS: 2.2 10*3/uL (ref 1.0–3.6)
Lymphocytes Relative: 49 %
MCH: 31.2 pg (ref 26.0–34.0)
MCHC: 35.5 g/dL (ref 32.0–36.0)
MCV: 87.9 fL (ref 80.0–100.0)
MONO ABS: 0.5 10*3/uL (ref 0.2–1.0)
MONOS PCT: 11 %
NEUTROS PCT: 34 %
Neutro Abs: 1.5 10*3/uL (ref 1.4–6.5)
Platelets: 170 10*3/uL (ref 150–440)
RBC: 3.5 MIL/uL — ABNORMAL LOW (ref 4.40–5.90)
RDW: 17 % — AB (ref 11.5–14.5)
WBC: 4.5 10*3/uL (ref 3.8–10.6)

## 2016-02-13 NOTE — Progress Notes (Signed)
Pt complains of knots or swollen areas under both armpits.  No other complaints

## 2016-02-14 DIAGNOSIS — C642 Malignant neoplasm of left kidney, except renal pelvis: Secondary | ICD-10-CM | POA: Insufficient documentation

## 2016-02-26 ENCOUNTER — Other Ambulatory Visit: Payer: Self-pay | Admitting: *Deleted

## 2016-02-27 NOTE — Telephone Encounter (Signed)
-----   Message from Cephus Richer sent at 02/26/2016  1:52 PM EDT ----- Contact: [phone] Per pt would like to get Ativan  refill ... [facility]

## 2016-02-27 NOTE — Telephone Encounter (Signed)
Per Dr. Grayland Ormond, pt will need to contact PCP to get refill. Pt made aware and pt verbalized understanding.

## 2016-02-28 ENCOUNTER — Ambulatory Visit: Payer: Managed Care, Other (non HMO) | Admitting: Oncology

## 2016-02-28 ENCOUNTER — Other Ambulatory Visit: Payer: Managed Care, Other (non HMO)

## 2016-03-05 ENCOUNTER — Ambulatory Visit
Admission: RE | Admit: 2016-03-05 | Discharge: 2016-03-05 | Disposition: A | Discharge status: Home / Self Care | Payer: Managed Care, Other (non HMO) | Source: Ambulatory Visit | Attending: Oncology | Admitting: Oncology

## 2016-03-05 DIAGNOSIS — R590 Localized enlarged lymph nodes: Secondary | ICD-10-CM | POA: Insufficient documentation

## 2016-03-05 DIAGNOSIS — N2889 Other specified disorders of kidney and ureter: Secondary | ICD-10-CM | POA: Insufficient documentation

## 2016-03-05 DIAGNOSIS — I2510 Atherosclerotic heart disease of native coronary artery without angina pectoris: Secondary | ICD-10-CM | POA: Insufficient documentation

## 2016-03-05 DIAGNOSIS — C9110 Chronic lymphocytic leukemia of B-cell type not having achieved remission: Secondary | ICD-10-CM | POA: Insufficient documentation

## 2016-03-05 DIAGNOSIS — J432 Centrilobular emphysema: Secondary | ICD-10-CM | POA: Insufficient documentation

## 2016-03-05 LAB — POCT I-STAT CREATININE: Creatinine, Ser: 1.4 mg/dL — ABNORMAL HIGH (ref 0.61–1.24)

## 2016-03-05 MED ORDER — IOPAMIDOL (ISOVUE-300) INJECTION 61%
100.0000 mL | Freq: Once | INTRAVENOUS | Status: AC | PRN
  Administered 2016-03-05: 100 mL via INTRAVENOUS

## 2016-03-06 NOTE — Progress Notes (Signed)
[name]  [phone] Fax:([phone]) [phone]  ID: Julian Medina OB: May 09, 1946  MR#: [id number]  [id number]  Patient Care Team: Julian Maple, MD as PCP - General (Family Medicine) Julian Huger, MD as Consulting Physician (Oncology) Julian Espy, MD as Consulting Physician (Urology)  CHIEF COMPLAINT: CLL  INTERVAL HISTORY: Patient returns to clinic today for discussion of his imaging results and treatment planning. His lymphadenopathy is still evident. He otherwise has felt well and is asymptomatic. He continues to be highly anxious. He denies any recent fevers, night sweats, or weight loss. He has no neurologic complaints. He denies any pain. He has no chest pain or shortness of breath. He denies any nausea, vomiting, constipation, or diarrhea. He has no urinary complaints. Patient offers no further specific complaints today.   REVIEW OF SYSTEMS:   Review of Systems  Constitutional: Negative for diaphoresis, fever, malaise/fatigue and weight loss.  Respiratory: Negative.  Negative for cough and shortness of breath.   Cardiovascular: Negative.  Negative for chest pain.  Gastrointestinal: Negative.  Negative for abdominal pain.  Genitourinary: Negative.   Musculoskeletal: Negative.   Neurological: Negative.  Negative for weakness.  Psychiatric/Behavioral: The patient is nervous/anxious.     As per HPI. Otherwise, a complete review of systems is negatve.  PAST MEDICAL HISTORY: Past Medical History:  Diagnosis Date  . Anxiety   . CLL (chronic lymphocytic leukemia) ([place])   . COPD (chronic obstructive pulmonary disease) ([place])   . Hypertension     PAST SURGICAL HISTORY: Past Surgical History:  Procedure Laterality Date  . HERNIA REPAIR    . ROBOTIC ASSITED PARTIAL NEPHRECTOMY Left 11/14/2015   Procedure: ROBOTIC ASSITED PARTIAL NEPHRECTOMY with intraoperative drop in ultrasound / Extensive Lysis of adhesions;  Surgeon: Julian Espy,  MD;  Location: [facility] ORS;  Service: Urology;  Laterality: Left;    FAMILY HISTORY: Heart disease and hypertension.     ADVANCED DIRECTIVES:    HEALTH MAINTENANCE: Social History  Substance Use Topics  . Smoking status: Current Every Day Smoker    Packs/day: 1.50    Years: 40.00    Types: Cigarettes  . Smokeless tobacco: Never Used  . Alcohol use 4.2 oz/week    7 Cans of beer per week     Comment: occasional     Colonoscopy:  PAP:  Bone density:  Lipid panel:  Allergies  Allergen Reactions  . Aspirin Other (See Comments)    Ulcer     Current Outpatient Prescriptions  Medication Sig Dispense Refill  . amLODipine (NORVASC) 10 MG tablet Take 1 tablet (10 mg total) by mouth daily. (Patient taking differently: Take 10 mg by mouth at bedtime. ) 90 tablet 4  . benazepril (LOTENSIN) 40 MG tablet 1 TABLET ONCE EACH DAY ORAL (Patient taking differently: Take 40 mg by mouth at bedtime. 1 TABLET ONCE EACH DAY ORAL) 90 tablet 4  . Fluticasone-Salmeterol (ADVAIR DISKUS) 250-50 MCG/DOSE AEPB INHALE 1 PUFF 2 TIMES A DAY 180 each 4  . LORazepam (ATIVAN) 1 MG tablet Take 1 tablet (1 mg total) by mouth daily as needed for anxiety. 1/2 to 1 tab 30 tablet 1   No current facility-administered medications for this visit.     OBJECTIVE: Vitals:   03/07/16 1103  BP: (!) 145/84  Pulse: 86  Resp: 18  Temp: (!) 96.4 F (35.8 C)     Body mass index is 18.02 kg/m.    ECOG FS:0 - Asymptomatic  General: Well-developed, well-nourished, no acute distress.  Eyes: Pink conjunctiva, anicteric sclera. Lungs: Clear to auscultation bilaterally. Heart: Regular rate and rhythm. No rubs, murmurs, or gallops. Abdomen: Soft, nontender, nondistended. No organomegaly noted, normoactive bowel sounds. Musculoskeletal: No edema, cyanosis, or clubbing. Neuro: Alert, answering all questions appropriately. Cranial nerves grossly intact. Skin: No rashes or petechiae noted. Psych: Normal affect. Lymphatics:  Bilateral 2-3 cm easily palpable lymph nodes in axilla. No other palpable lymphadenopathy.   LAB RESULTS:  Lab Results  Component Value Date   NA 140 12/15/2015   K 4.3 12/15/2015   CL 100 12/15/2015   CO2 19 12/15/2015   GLUCOSE 79 12/15/2015   BUN 12 12/15/2015   CREATININE 1.40 (H) 03/05/2016   CALCIUM 9.8 12/15/2015   PROT 6.7 05/04/2015   ALBUMIN 4.9 (H) 05/04/2015   AST 19 05/04/2015   ALT 13 05/04/2015   ALKPHOS 66 05/04/2015   BILITOT 0.5 05/04/2015   GFRNONAA 53 (L) 12/15/2015   GFRAA 61 12/15/2015    Lab Results  Component Value Date   WBC 4.5 02/13/2016   NEUTROABS 1.5 02/13/2016   HGB 10.9 (L) 02/13/2016   HCT 30.8 (L) 02/13/2016   MCV 87.9 02/13/2016   PLT 170 02/13/2016     STUDIES: Ct Soft Tissue Neck W Contrast  Result Date: 03/05/2016 CLINICAL DATA:  70 year old male with chronic lymphocytic leukemia. Enlarging axillary lymph nodes. Restaging. Subsequent encounter. EXAM: CT NECK WITH CONTRAST TECHNIQUE: Multidetector CT imaging of the neck was performed using the standard protocol following the bolus administration of intravenous contrast. CONTRAST:  125mL ISOVUE-300 IOPAMIDOL (ISOVUE-300) INJECTION 61% in conjunction with contrast enhanced imaging of the chest, abdomen, and pelvis reported separately. COMPARISON:  Neck CT 05/16/2014. FINDINGS: Pharynx and larynx: The glottis is closed. Subsequently the soft tissue at the area epiglottic folds is opposed and appears somewhat prominent series 3, image 69). The hypopharynx, oropharynx and nasopharynx appear stable and normal. Negative parapharyngeal and retropharyngeal spaces. Salivary glands: Negative sublingual space, submandibular glands and parotid glands. Thyroid: Stable, negative for age. Lymph nodes: Diffuse enlargement of bilateral cervical lymph nodes at nearly all nodal stations since 2015. Nodes appear abnormally increased in size and number now throughout the bilateral neck, but worse on the left.  Individual nodes measure up to 17 mm 18 mm short axis, bilateral level 2 and left level 3). Conglomerate left level 2 nodal mass measures a little larger than 5 cm in total. Left level 1 B nodes now measure up to 9 mm short axis (previously 4 mm or less). No cystic or necrotic nodes identified. Lymphadenopathy continues into the upper chest. Vascular: Major vascular structures in the neck and at the skullbase remain patent, although both internal jugular veins are effaced secondary to the lymphadenopathy. Calcified atherosclerosis at the skull base. Limited intracranial: Negative. Chronic cystic appearing enlargement of the left ear lobe incidentally re- identified. Visualized orbits: Negative. Mastoids and visualized paranasal sinuses: Visualized paranasal sinuses and mastoids are stable and well pneumatized. Skeleton: Absent dentition. Degenerative changes in the cervical spine. No acute or suspicious osseous lesion identified in the neck. Upper chest: Lymphadenopathy, reported separately today. IMPRESSION: 1. Generalized bilateral neck lymphadenopathy, new since 2015 and worse on the left. 2. Lymphadenopathy continues into the upper chest. CT chest abdomen and pelvis today are reported separately. Electronically Signed   By: Genevie Ann M.D.   On: 03/05/2016 13:00   Ct Chest W Contrast  Result Date: 03/05/2016 CLINICAL DATA:  Chronic lymphocytic leukemia. Patient reports increasing axillary adenopathy. EXAM: CT CHEST, ABDOMEN, AND  PELVIS WITH CONTRAST TECHNIQUE: Multidetector CT imaging of the chest, abdomen and pelvis was performed following the standard protocol during bolus administration of intravenous contrast. CONTRAST:  115mL ISOVUE-300 IOPAMIDOL (ISOVUE-300) INJECTION 61% COMPARISON:  Multiple exams, including 09/21/2015 FINDINGS: CT CHEST FINDINGS Cardiovascular: Mild atherosclerotic calcification near the origins of the brachiocephalic and left subclavian arteries. Minimal atherosclerotic calcification  in the aortic arch. Coronary artery atherosclerotic calcification most striking in the left anterior descending coronary artery. Mediastinum/Nodes: Extensive hilar, mediastinal, and axillary adenopathy including a right upper paratracheal node measuring 1.9 cm in short axis (previously no node was present at this site on 05/16/2014) and a superficial left axillary lymph node measuring 2.9 cm in short axis on image 9/2 (formerly 0.9 cm). One of several confluent right axillary lymph nodes measures 2.0 cm in short axis on image 30/2. There is some large supraclavicular lymph nodes on the left. Clips are present along the gastroesophageal junction. Lungs/Pleura: Centrilobular emphysema. Subsegmental atelectasis or scarring in the posterior basal segment right lower lobe. Mild subpleural nodularity anteriorly along the left lower lobe, no change from 05/16/2014. Musculoskeletal: Unremarkable CT ABDOMEN PELVIS FINDINGS Hepatobiliary: Mildly prominent but stable common bile duct at 8 mm diameter. Otherwise unremarkable. Pancreas: Dorsal pancreatic duct remains borderline prominent at 3 mm. Spleen: The spleen measures 10.6 by 5.4 by 7.3 cm (volume = 220 cm^3). Unremarkable appearance. Adrenals/Urinary Tract: Postoperative findings along the left kidney upper pole anteriorly there are hypodense lesions in both kidneys which are technically too small to characterize but merit observation. In particular a complex lesion anteriorly in the right kidney upper pole on image 6 of series 7 measures about 9 mm in diameter and warrants observation. Mildly thick-walled urinary bladder. Stomach/Bowel: Unremarkable Vascular/Lymphatic: Aortoiliac atherosclerotic vascular disease. There is extensive porta hepatis, retroperitoneal, and pelvic adenopathy. Porta hepatis conglomerate adenopathy on image 60 of series 2 measures 3.5 cm in short axis on image 60/2. At about the level of the bifurcation, a left periaortic node measures 2.7 cm in  short axis on image 83/2. An index right external iliac node on image 108 series 2 measures 4.8 cm in short axis on image 108/2. Numerous additional abnormally enlarged lymph nodes are present. Reproductive: Unremarkable Other: No supplemental non-categorized findings. Musculoskeletal: Unremarkable IMPRESSION: 1. New bulky and extensive supraclavicular, mediastinal, hilar, axillary, porta hepatis, retroperitoneal, and pelvic adenopathy. 2. Postoperative findings in the left kidney upper pole from partial nephrectomy. 3. The is a complex lesion in the right kidney upper pole which is too small to definitively characterize but which merits observation. Consider follow up renal protocol MRI in 6-12 months time. 4. Atherosclerosis, most notably involving the left anterior descending coronary artery. 5. Centrilobular emphysema. Electronically Signed   By: Van Clines M.D.   On: 03/05/2016 09:27   Ct Abdomen Pelvis W Contrast  Result Date: 03/05/2016 CLINICAL DATA:  Chronic lymphocytic leukemia. Patient reports increasing axillary adenopathy. EXAM: CT CHEST, ABDOMEN, AND PELVIS WITH CONTRAST TECHNIQUE: Multidetector CT imaging of the chest, abdomen and pelvis was performed following the standard protocol during bolus administration of intravenous contrast. CONTRAST:  123mL ISOVUE-300 IOPAMIDOL (ISOVUE-300) INJECTION 61% COMPARISON:  Multiple exams, including 09/21/2015 FINDINGS: CT CHEST FINDINGS Cardiovascular: Mild atherosclerotic calcification near the origins of the brachiocephalic and left subclavian arteries. Minimal atherosclerotic calcification in the aortic arch. Coronary artery atherosclerotic calcification most striking in the left anterior descending coronary artery. Mediastinum/Nodes: Extensive hilar, mediastinal, and axillary adenopathy including a right upper paratracheal node measuring 1.9 cm in short axis (previously no  node was present at this site on 05/16/2014) and a superficial left  axillary lymph node measuring 2.9 cm in short axis on image 9/2 (formerly 0.9 cm). One of several confluent right axillary lymph nodes measures 2.0 cm in short axis on image 30/2. There is some large supraclavicular lymph nodes on the left. Clips are present along the gastroesophageal junction. Lungs/Pleura: Centrilobular emphysema. Subsegmental atelectasis or scarring in the posterior basal segment right lower lobe. Mild subpleural nodularity anteriorly along the left lower lobe, no change from 05/16/2014. Musculoskeletal: Unremarkable CT ABDOMEN PELVIS FINDINGS Hepatobiliary: Mildly prominent but stable common bile duct at 8 mm diameter. Otherwise unremarkable. Pancreas: Dorsal pancreatic duct remains borderline prominent at 3 mm. Spleen: The spleen measures 10.6 by 5.4 by 7.3 cm (volume = 220 cm^3). Unremarkable appearance. Adrenals/Urinary Tract: Postoperative findings along the left kidney upper pole anteriorly there are hypodense lesions in both kidneys which are technically too small to characterize but merit observation. In particular a complex lesion anteriorly in the right kidney upper pole on image 6 of series 7 measures about 9 mm in diameter and warrants observation. Mildly thick-walled urinary bladder. Stomach/Bowel: Unremarkable Vascular/Lymphatic: Aortoiliac atherosclerotic vascular disease. There is extensive porta hepatis, retroperitoneal, and pelvic adenopathy. Porta hepatis conglomerate adenopathy on image 60 of series 2 measures 3.5 cm in short axis on image 60/2. At about the level of the bifurcation, a left periaortic node measures 2.7 cm in short axis on image 83/2. An index right external iliac node on image 108 series 2 measures 4.8 cm in short axis on image 108/2. Numerous additional abnormally enlarged lymph nodes are present. Reproductive: Unremarkable Other: No supplemental non-categorized findings. Musculoskeletal: Unremarkable IMPRESSION: 1. New bulky and extensive supraclavicular,  mediastinal, hilar, axillary, porta hepatis, retroperitoneal, and pelvic adenopathy. 2. Postoperative findings in the left kidney upper pole from partial nephrectomy. 3. The is a complex lesion in the right kidney upper pole which is too small to definitively characterize but which merits observation. Consider follow up renal protocol MRI in 6-12 months time. 4. Atherosclerosis, most notably involving the left anterior descending coronary artery. 5. Centrilobular emphysema. Electronically Signed   By: Van Clines M.D.   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   PLAN:    1.  CLL: Flow cytometry confirmed the diagnosis.  Patient is also ZAP-70 positive this which indicates it may be a more aggressive form of CLL.  Patient now has easily palpable axillary lymphadenopathy. CT results reviewed independently and reported as above with obvious progression of disease. Patient had an excellent response to Rituxan and Donnie Aho which was completed in September 2015, therefore will reinitiate treatment using Rituxan and Treanda on day 1 and then Treanda only on day 2. Plan to give 4 cycles every 28 days and then repeat imaging. Patient has also requested port placement prior to initiating treatment. Return to clinic on March 21, 2016 to initiate cycle 1, day 1. 2.  Anxiety: Continue Xanax PRN. 3.  Renal cell carcinoma: Patient is status post partial nephrectomy on Nov 14, 2015. Continued follow-up per urology.   Approximately 30 minutes was spent in discussion of which greater than 50% was consultation.   Patient expressed understanding and was in agreement with this plan. He also understands that He can call clinic at any time with any questions, concerns, or complaints.    Julian Huger, MD   03/10/2016 3:50 PM

## 2016-03-07 ENCOUNTER — Inpatient Hospital Stay (HOSPITAL_BASED_OUTPATIENT_CLINIC_OR_DEPARTMENT_OTHER): Payer: Managed Care, Other (non HMO) | Admitting: Oncology

## 2016-03-07 VITALS — BP 145/84 | HR 86 | Temp 96.4°F | Resp 18 | Wt 129.2 lb

## 2016-03-07 DIAGNOSIS — C9110 Chronic lymphocytic leukemia of B-cell type not having achieved remission: Secondary | ICD-10-CM

## 2016-03-07 DIAGNOSIS — J449 Chronic obstructive pulmonary disease, unspecified: Secondary | ICD-10-CM

## 2016-03-07 DIAGNOSIS — F419 Anxiety disorder, unspecified: Secondary | ICD-10-CM | POA: Diagnosis not present

## 2016-03-07 DIAGNOSIS — C642 Malignant neoplasm of left kidney, except renal pelvis: Secondary | ICD-10-CM

## 2016-03-07 DIAGNOSIS — Z79899 Other long term (current) drug therapy: Secondary | ICD-10-CM

## 2016-03-07 DIAGNOSIS — I10 Essential (primary) hypertension: Secondary | ICD-10-CM

## 2016-03-07 DIAGNOSIS — C649 Malignant neoplasm of unspecified kidney, except renal pelvis: Secondary | ICD-10-CM

## 2016-03-07 DIAGNOSIS — R591 Generalized enlarged lymph nodes: Secondary | ICD-10-CM

## 2016-03-07 DIAGNOSIS — F17210 Nicotine dependence, cigarettes, uncomplicated: Secondary | ICD-10-CM

## 2016-03-07 NOTE — Progress Notes (Signed)
States is feeling nervous today but offers no complaints.

## 2016-03-12 ENCOUNTER — Telehealth: Payer: Self-pay | Admitting: *Deleted

## 2016-03-12 ENCOUNTER — Other Ambulatory Visit: Payer: Self-pay | Admitting: Vascular Surgery

## 2016-03-12 NOTE — Telephone Encounter (Signed)
-----   Message from Cephus Richer sent at 03/12/2016  8:18 AM EDT ----- PLEASE CALL THE WIFE NOT THE PT.. AT [phone].Please dont call after 3.  Pt wife has some question but don't want to ask in front of the pt. Pt wife state pt is overwhelmed and she want to be strong and keep a level head in order to keep him claim. Please call her.

## 2016-03-12 NOTE — Telephone Encounter (Signed)
Attempted to call patient's spouse to answer questions but she was not at home. Message left that I will try to call her back again this afternoon around 3pm.

## 2016-03-13 NOTE — Telephone Encounter (Signed)
Spoke with pt's wife and answered all questions. 

## 2016-03-14 ENCOUNTER — Encounter: Payer: Self-pay | Admitting: *Deleted

## 2016-03-14 ENCOUNTER — Encounter
Admission: RE | Disposition: A | Discharge status: Home / Self Care | Payer: Self-pay | Source: Ambulatory Visit | Attending: Vascular Surgery

## 2016-03-14 ENCOUNTER — Ambulatory Visit
Admission: RE | Admit: 2016-03-14 | Discharge: 2016-03-14 | Disposition: A | Discharge status: Home / Self Care | Payer: Managed Care, Other (non HMO) | Source: Ambulatory Visit | Attending: Vascular Surgery | Admitting: Vascular Surgery

## 2016-03-14 DIAGNOSIS — F419 Anxiety disorder, unspecified: Secondary | ICD-10-CM | POA: Insufficient documentation

## 2016-03-14 DIAGNOSIS — Z905 Acquired absence of kidney: Secondary | ICD-10-CM | POA: Insufficient documentation

## 2016-03-14 DIAGNOSIS — C649 Malignant neoplasm of unspecified kidney, except renal pelvis: Secondary | ICD-10-CM | POA: Insufficient documentation

## 2016-03-14 DIAGNOSIS — I10 Essential (primary) hypertension: Secondary | ICD-10-CM | POA: Diagnosis not present

## 2016-03-14 DIAGNOSIS — Z8249 Family history of ischemic heart disease and other diseases of the circulatory system: Secondary | ICD-10-CM | POA: Insufficient documentation

## 2016-03-14 DIAGNOSIS — C9110 Chronic lymphocytic leukemia of B-cell type not having achieved remission: Secondary | ICD-10-CM | POA: Insufficient documentation

## 2016-03-14 DIAGNOSIS — Z886 Allergy status to analgesic agent status: Secondary | ICD-10-CM | POA: Insufficient documentation

## 2016-03-14 DIAGNOSIS — F17210 Nicotine dependence, cigarettes, uncomplicated: Secondary | ICD-10-CM | POA: Insufficient documentation

## 2016-03-14 DIAGNOSIS — C642 Malignant neoplasm of left kidney, except renal pelvis: Secondary | ICD-10-CM

## 2016-03-14 HISTORY — PX: PERIPHERAL VASCULAR CATHETERIZATION: SHX172C

## 2016-03-14 SURGERY — PORTA CATH INSERTION
Anesthesia: Moderate Sedation

## 2016-03-14 MED ORDER — HEPARIN (PORCINE) IN NACL 2-0.9 UNIT/ML-% IJ SOLN
INTRAMUSCULAR | Status: AC
  Filled 2016-03-14: qty 500

## 2016-03-14 MED ORDER — SODIUM CHLORIDE 0.9 % IV SOLN
INTRAVENOUS | Status: DC
  Administered 2016-03-14: 09:00:00 via INTRAVENOUS

## 2016-03-14 MED ORDER — SODIUM CHLORIDE 0.9 % IR SOLN
Freq: Once | Status: DC
  Filled 2016-03-14: qty 2

## 2016-03-14 MED ORDER — LIDOCAINE-EPINEPHRINE (PF) 1 %-1:200000 IJ SOLN
INTRAMUSCULAR | Status: AC
  Filled 2016-03-14: qty 20

## 2016-03-14 MED ORDER — FENTANYL CITRATE (PF) 100 MCG/2ML IJ SOLN
INTRAMUSCULAR | Status: DC | PRN
  Administered 2016-03-14 (×2): 25 ug via INTRAVENOUS
  Administered 2016-03-14: 50 ug via INTRAVENOUS

## 2016-03-14 MED ORDER — DEXTROSE 5 % IV SOLN
1.5000 g | INTRAVENOUS | Status: AC
  Administered 2016-03-14: 1.5 g via INTRAVENOUS

## 2016-03-14 MED ORDER — HYDROMORPHONE HCL 1 MG/ML IJ SOLN: 1.0000 mg | Freq: Once | INTRAMUSCULAR | Status: DC

## 2016-03-14 MED ORDER — FENTANYL CITRATE (PF) 100 MCG/2ML IJ SOLN
INTRAMUSCULAR | Status: AC
  Filled 2016-03-14: qty 4

## 2016-03-14 MED ORDER — MIDAZOLAM HCL 5 MG/5ML IJ SOLN
INTRAMUSCULAR | Status: AC
Start: 2016-03-14 — End: 2016-03-14
  Filled 2016-03-14: qty 5

## 2016-03-14 MED ORDER — ONDANSETRON HCL 4 MG/2ML IJ SOLN: 4.0000 mg | Freq: Four times a day (QID) | INTRAMUSCULAR | Status: DC | PRN

## 2016-03-14 MED ORDER — MIDAZOLAM HCL 2 MG/2ML IJ SOLN
INTRAMUSCULAR | Status: DC | PRN
  Administered 2016-03-14 (×2): 1 mg via INTRAVENOUS
  Administered 2016-03-14: 2 mg via INTRAVENOUS

## 2016-03-14 SURGICAL SUPPLY — 10 items
BAG DECANTER STRL (MISCELLANEOUS) ×2 IMPLANT
KIT PORT POWER 8FR ISP CVUE (Catheter) ×2 IMPLANT
PACK ANGIOGRAPHY (CUSTOM PROCEDURE TRAY) ×2 IMPLANT
PAD GROUND ADULT SPLIT (MISCELLANEOUS) ×2 IMPLANT
PENCIL ELECTRO HAND CTR (MISCELLANEOUS) ×2 IMPLANT
PREP CHG 10.5 TEAL (MISCELLANEOUS) ×2 IMPLANT
SUT MNCRL AB 4-0 PS2 18 (SUTURE) ×2 IMPLANT
SUT PROLENE 0 CT 1 30 (SUTURE) ×2 IMPLANT
SUTURE VIC 3-0 (SUTURE) ×2 IMPLANT
TOWEL OR 17X26 4PK STRL BLUE (TOWEL DISPOSABLE) ×2 IMPLANT

## 2016-03-14 NOTE — H&P (Signed)
  [facility] History & Physical Update  The patient was interviewed and re-examined.  The patient's previous History and Physical has been reviewed and is unchanged.  There is no change in the plan of care. We plan to proceed with the scheduled procedure.  Kalista Laguardia, MD  03/14/2016, 8:38 AM

## 2016-03-14 NOTE — Op Note (Signed)
      [facility]       Operative Note  Date: 03/14/2016  Preoperative diagnosis:  1. CLL and renal cell carcinoma  Postoperative diagnosis:  Same as above  Procedures: #1. Ultrasound guidance for vascular access to the right internal jugular vein. #2. Fluoroscopic guidance for placement of catheter. #3. Placement of CT compatible Port-A-Cath, right internal jugular vein.  Surgeon: Leotis Pain, MD.   Anesthesia: Local with moderate conscious sedation for approximately 20  minutes using 4 mg of Versed and 100 mcg of Fentanyl  Fluoroscopy time: less than 1 minute  Contrast used: 0  Estimated blood loss: minimal  Indication for the procedure:  The patient is a 70 y.o.male with CLL.  He also had a renal cell carcinoma resected earlier this year.  The patient needs a Port-A-Cath for durable venous access, chemotherapy, lab draws, and CT scans. We are asked to place this. Risks and benefits were discussed and informed consent was obtained.  Description of procedure: The patient was brought to the vascular and interventional radiology suite.  Moderate conscious sedation was administered throughout the procedure during a face to face encounter with the patient with my supervision of the RN administering medicines and monitoring the patient's vital signs, pulse oximetry, telemetry and mental status throughout from the start of the procedure until the patient was taken to the recovery room. The right neck chest and shoulder were sterilely prepped and draped, and a sterile surgical field was created. Ultrasound was used to help visualize a patent right internal jugular vein. This was then accessed under direct ultrasound guidance without difficulty with the Seldinger needle and a permanent image was recorded. A J-wire was placed. After skin nick and dilatation, the peel-away sheath was then placed over the wire. I then anesthetized an area under the clavicle approximately 1-2  fingerbreadths. A transverse incision was created and an inferior pocket was created with electrocautery and blunt dissection. The port was then brought onto the field, placed into the pocket and secured to the chest wall with 2 Prolene sutures. The catheter was connected to the port and tunneled from the subclavicular incision to the access site. Fluoroscopic guidance was then used to cut the catheter to an appropriate length. The catheter was then placed through the peel-away sheath and the peel-away sheath was removed. The catheter tip was parked in excellent location under fluorocoscopic guidance in the SVC just above the right atrium. The pocket was then irrigated with antibiotic impregnated saline and the wound was closed with a running 3-0 Vicryl and a 4-0 Monocryl. The access incision was closed with a single 4-0 Monocryl. The Huber needle was used to withdraw blood and flush the port with heparinized saline. Dermabond was then placed as a dressing. The patient tolerated the procedure well and was taken to the recovery room in stable condition.   DEW,JASON 03/14/2016 9:27 AM

## 2016-03-15 ENCOUNTER — Encounter: Payer: Self-pay | Admitting: Vascular Surgery

## 2016-03-15 ENCOUNTER — Other Ambulatory Visit: Payer: Self-pay | Admitting: Oncology

## 2016-03-19 ENCOUNTER — Other Ambulatory Visit: Payer: Self-pay | Admitting: *Deleted

## 2016-03-19 MED ORDER — LIDOCAINE-PRILOCAINE 2.5-2.5 % EX CREA: 1.0000 "application " | TOPICAL_CREAM | CUTANEOUS | 2 refills | Status: DC | PRN

## 2016-03-20 ENCOUNTER — Other Ambulatory Visit: Payer: Self-pay | Admitting: *Deleted

## 2016-03-20 DIAGNOSIS — C9110 Chronic lymphocytic leukemia of B-cell type not having achieved remission: Secondary | ICD-10-CM

## 2016-03-20 NOTE — Progress Notes (Signed)
[place]  Telephone:(336) [phone] Fax:(336) [phone]  ID: Julian Medina OB: 10-27-1945  MR#: [id number]  [id number]  Patient Care Team: Guadalupe Maple, MD as PCP - General (Family Medicine) Lloyd Huger, MD as Consulting Physician (Oncology) [name], MD as Consulting Physician (Urology)  CHIEF COMPLAINT: CLL  INTERVAL HISTORY: Patient returns to clinic today for further evaluation and initiation of cycle 1 of Rituxan and Treanda.   His lymphadenopathy is still evident. He otherwise has felt well and is asymptomatic. He continues to be highly anxious. He denies any recent fevers, night sweats, or weight loss. He has no neurologic complaints. He denies any pain. He has no chest pain or shortness of breath. He denies any nausea, vomiting, constipation, or diarrhea. He has no urinary complaints. Patient offers no further specific complaints today.   REVIEW OF SYSTEMS:   Review of Systems  Constitutional: Negative for diaphoresis, fever, malaise/fatigue and weight loss.  Respiratory: Negative.  Negative for cough and shortness of breath.   Cardiovascular: Negative.  Negative for chest pain.  Gastrointestinal: Negative.  Negative for abdominal pain.  Genitourinary: Negative.   Musculoskeletal: Negative.   Neurological: Negative.  Negative for weakness.  Psychiatric/Behavioral: The patient is nervous/anxious.     As per HPI. Otherwise, a complete review of systems is negative.  PAST MEDICAL HISTORY: Past Medical History:  Diagnosis Date  . Anxiety   . CLL (chronic lymphocytic leukemia) ([place])   . COPD (chronic obstructive pulmonary disease) ([place])   . Hypertension     PAST SURGICAL HISTORY: Past Surgical History:  Procedure Laterality Date  . HERNIA REPAIR    . PERIPHERAL VASCULAR CATHETERIZATION N/A 03/14/2016   Procedure: Glori Luis Cath Insertion;  Surgeon: Algernon Huxley, MD;  Location: [place] [facility];  Service: Cardiovascular;  Laterality:  N/A;  . ROBOTIC ASSITED PARTIAL NEPHRECTOMY Left 11/14/2015   Procedure: ROBOTIC ASSITED PARTIAL NEPHRECTOMY with intraoperative drop in ultrasound / Extensive Lysis of adhesions;  Surgeon: [name], MD;  Location: [facility];  Service: Urology;  Laterality: Left;    FAMILY HISTORY: Heart disease and hypertension.     ADVANCED DIRECTIVES:    HEALTH MAINTENANCE: Social History  Substance Use Topics  . Smoking status: Current Every Day Smoker    Packs/day: 1.50    Years: 40.00    Types: Cigarettes  . Smokeless tobacco: Never Used  . Alcohol use 4.2 oz/week    7 Cans of beer per week     Comment: occasional     Colonoscopy:  PAP:  Bone density:  Lipid panel:  Allergies  Allergen Reactions  . Aspirin Other (See Comments)    Ulcer     Current Outpatient Prescriptions  Medication Sig Dispense Refill  . amLODipine (NORVASC) 10 MG tablet Take 1 tablet (10 mg total) by mouth daily. (Patient taking differently: Take 10 mg by mouth at bedtime. ) 90 tablet 4  . benazepril (LOTENSIN) 40 MG tablet 1 TABLET ONCE EACH DAY ORAL (Patient taking differently: Take 40 mg by mouth at bedtime. 1 TABLET ONCE EACH DAY ORAL) 90 tablet 4  . Fluticasone-Salmeterol (ADVAIR DISKUS) 250-50 MCG/DOSE AEPB INHALE 1 PUFF 2 TIMES A DAY 180 each 4  . lidocaine-prilocaine (EMLA) cream Apply 1 application topically as needed. Apply 1-2 hours prior to chemotherapy. Cover with plastic wrap. 30 g 2  . LORazepam (ATIVAN) 1 MG tablet Take 1 tablet (1 mg total) by mouth daily as needed for anxiety. 1/2 to 1 tab 30 tablet 1  No current facility-administered medications for this visit.     OBJECTIVE: Vitals:   03/21/16 0914  BP: (!) 145/87  Pulse: 61  Resp: 18  Temp: 97.1 F (36.2 C)     Body mass index is 18.19 kg/m.    ECOG FS:0 - Asymptomatic  General: Well-developed, well-nourished, no acute distress. Eyes: Pink conjunctiva, anicteric sclera. Lungs: Clear to auscultation bilaterally. Heart:  Regular rate and rhythm. No rubs, murmurs, or gallops. Abdomen: Soft, nontender, nondistended. No organomegaly noted, normoactive bowel sounds. Musculoskeletal: No edema, cyanosis, or clubbing. Neuro: Alert, answering all questions appropriately. Cranial nerves grossly intact. Skin: No rashes or petechiae noted. Psych: Normal affect. Lymphatics: Bilateral 2-3 cm easily palpable lymph nodes in axilla. No other palpable lymphadenopathy.   LAB RESULTS:  Lab Results  Component Value Date   NA 135 03/21/2016   K 4.0 03/21/2016   CL 108 03/21/2016   CO2 22 03/21/2016   GLUCOSE 93 03/21/2016   BUN 11 03/21/2016   CREATININE 1.36 (H) 03/21/2016   CALCIUM 8.9 03/21/2016   PROT 6.6 03/21/2016   ALBUMIN 4.5 03/21/2016   AST 22 03/21/2016   ALT 14 (L) 03/21/2016   ALKPHOS 56 03/21/2016   BILITOT 0.6 03/21/2016   GFRNONAA 52 (L) 03/21/2016   GFRAA 60 (L) 03/21/2016    Lab Results  Component Value Date   WBC 10.1 03/21/2016   NEUTROABS 4.6 03/21/2016   HGB 11.8 (L) 03/21/2016   HCT 33.4 (L) 03/21/2016   MCV 90.7 03/21/2016   PLT 161 03/21/2016     STUDIES: Ct Soft Tissue Neck W Contrast  Result Date: 03/05/2016 CLINICAL DATA:  70 year old male with chronic lymphocytic leukemia. Enlarging axillary lymph nodes. Restaging. Subsequent encounter. EXAM: CT NECK WITH CONTRAST TECHNIQUE: Multidetector CT imaging of the neck was performed using the standard protocol following the bolus administration of intravenous contrast. CONTRAST:  140mL ISOVUE-300 IOPAMIDOL (ISOVUE-300) INJECTION 61% in conjunction with contrast enhanced imaging of the chest, abdomen, and pelvis reported separately. COMPARISON:  Neck CT 05/16/2014. FINDINGS: Pharynx and larynx: The glottis is closed. Subsequently the soft tissue at the area epiglottic folds is opposed and appears somewhat prominent series 3, image 69). The hypopharynx, oropharynx and nasopharynx appear stable and normal. Negative parapharyngeal and  retropharyngeal spaces. Salivary glands: Negative sublingual space, submandibular glands and parotid glands. Thyroid: Stable, negative for age. Lymph nodes: Diffuse enlargement of bilateral cervical lymph nodes at nearly all nodal stations since 2015. Nodes appear abnormally increased in size and number now throughout the bilateral neck, but worse on the left. Individual nodes measure up to 17 mm 18 mm short axis, bilateral level 2 and left level 3). Conglomerate left level 2 nodal mass measures a little larger than 5 cm in total. Left level 1 B nodes now measure up to 9 mm short axis (previously 4 mm or less). No cystic or necrotic nodes identified. Lymphadenopathy continues into the upper chest. Vascular: Major vascular structures in the neck and at the skullbase remain patent, although both internal jugular veins are effaced secondary to the lymphadenopathy. Calcified atherosclerosis at the skull base. Limited intracranial: Negative. Chronic cystic appearing enlargement of the left ear lobe incidentally re- identified. Visualized orbits: Negative. Mastoids and visualized paranasal sinuses: Visualized paranasal sinuses and mastoids are stable and well pneumatized. Skeleton: Absent dentition. Degenerative changes in the cervical spine. No acute or suspicious osseous lesion identified in the neck. Upper chest: Lymphadenopathy, reported separately today. IMPRESSION: 1. Generalized bilateral neck lymphadenopathy, new since 2015 and worse on  the left. 2. Lymphadenopathy continues into the upper chest. CT chest abdomen and pelvis today are reported separately. Electronically Signed   By: Genevie Ann M.D.   On: 03/05/2016 13:00   Ct Chest W Contrast  Result Date: 03/05/2016 CLINICAL DATA:  Chronic lymphocytic leukemia. Patient reports increasing axillary adenopathy. EXAM: CT CHEST, ABDOMEN, AND PELVIS WITH CONTRAST TECHNIQUE: Multidetector CT imaging of the chest, abdomen and pelvis was performed following the standard  protocol during bolus administration of intravenous contrast. CONTRAST:  123mL ISOVUE-300 IOPAMIDOL (ISOVUE-300) INJECTION 61% COMPARISON:  Multiple exams, including 09/21/2015 FINDINGS: CT CHEST FINDINGS Cardiovascular: Mild atherosclerotic calcification near the origins of the brachiocephalic and left subclavian arteries. Minimal atherosclerotic calcification in the aortic arch. Coronary artery atherosclerotic calcification most striking in the left anterior descending coronary artery. Mediastinum/Nodes: Extensive hilar, mediastinal, and axillary adenopathy including a right upper paratracheal node measuring 1.9 cm in short axis (previously no node was present at this site on 05/16/2014) and a superficial left axillary lymph node measuring 2.9 cm in short axis on image 9/2 (formerly 0.9 cm). One of several confluent right axillary lymph nodes measures 2.0 cm in short axis on image 30/2. There is some large supraclavicular lymph nodes on the left. Clips are present along the gastroesophageal junction. Lungs/Pleura: Centrilobular emphysema. Subsegmental atelectasis or scarring in the posterior basal segment right lower lobe. Mild subpleural nodularity anteriorly along the left lower lobe, no change from 05/16/2014. Musculoskeletal: Unremarkable CT ABDOMEN PELVIS FINDINGS Hepatobiliary: Mildly prominent but stable common bile duct at 8 mm diameter. Otherwise unremarkable. Pancreas: Dorsal pancreatic duct remains borderline prominent at 3 mm. Spleen: The spleen measures 10.6 by 5.4 by 7.3 cm (volume = 220 cm^3). Unremarkable appearance. Adrenals/Urinary Tract: Postoperative findings along the left kidney upper pole anteriorly there are hypodense lesions in both kidneys which are technically too small to characterize but merit observation. In particular a complex lesion anteriorly in the right kidney upper pole on image 6 of series 7 measures about 9 mm in diameter and warrants observation. Mildly thick-walled urinary  bladder. Stomach/Bowel: Unremarkable Vascular/Lymphatic: Aortoiliac atherosclerotic vascular disease. There is extensive porta hepatis, retroperitoneal, and pelvic adenopathy. Porta hepatis conglomerate adenopathy on image 60 of series 2 measures 3.5 cm in short axis on image 60/2. At about the level of the bifurcation, a left periaortic node measures 2.7 cm in short axis on image 83/2. An index right external iliac node on image 108 series 2 measures 4.8 cm in short axis on image 108/2. Numerous additional abnormally enlarged lymph nodes are present. Reproductive: Unremarkable Other: No supplemental non-categorized findings. Musculoskeletal: Unremarkable IMPRESSION: 1. New bulky and extensive supraclavicular, mediastinal, hilar, axillary, porta hepatis, retroperitoneal, and pelvic adenopathy. 2. Postoperative findings in the left kidney upper pole from partial nephrectomy. 3. The is a complex lesion in the right kidney upper pole which is too small to definitively characterize but which merits observation. Consider follow up renal protocol MRI in 6-12 months time. 4. Atherosclerosis, most notably involving the left anterior descending coronary artery. 5. Centrilobular emphysema. Electronically Signed   By: Van Clines M.D.   On: 03/05/2016 09:27   Ct Abdomen Pelvis W Contrast  Result Date: 03/05/2016 CLINICAL DATA:  Chronic lymphocytic leukemia. Patient reports increasing axillary adenopathy. EXAM: CT CHEST, ABDOMEN, AND PELVIS WITH CONTRAST TECHNIQUE: Multidetector CT imaging of the chest, abdomen and pelvis was performed following the standard protocol during bolus administration of intravenous contrast. CONTRAST:  146mL ISOVUE-300 IOPAMIDOL (ISOVUE-300) INJECTION 61% COMPARISON:  Multiple exams, including 09/21/2015 FINDINGS:  CT CHEST FINDINGS Cardiovascular: Mild atherosclerotic calcification near the origins of the brachiocephalic and left subclavian arteries. Minimal atherosclerotic calcification  in the aortic arch. Coronary artery atherosclerotic calcification most striking in the left anterior descending coronary artery. Mediastinum/Nodes: Extensive hilar, mediastinal, and axillary adenopathy including a right upper paratracheal node measuring 1.9 cm in short axis (previously no node was present at this site on 05/16/2014) and a superficial left axillary lymph node measuring 2.9 cm in short axis on image 9/2 (formerly 0.9 cm). One of several confluent right axillary lymph nodes measures 2.0 cm in short axis on image 30/2. There is some large supraclavicular lymph nodes on the left. Clips are present along the gastroesophageal junction. Lungs/Pleura: Centrilobular emphysema. Subsegmental atelectasis or scarring in the posterior basal segment right lower lobe. Mild subpleural nodularity anteriorly along the left lower lobe, no change from 05/16/2014. Musculoskeletal: Unremarkable CT ABDOMEN PELVIS FINDINGS Hepatobiliary: Mildly prominent but stable common bile duct at 8 mm diameter. Otherwise unremarkable. Pancreas: Dorsal pancreatic duct remains borderline prominent at 3 mm. Spleen: The spleen measures 10.6 by 5.4 by 7.3 cm (volume = 220 cm^3). Unremarkable appearance. Adrenals/Urinary Tract: Postoperative findings along the left kidney upper pole anteriorly there are hypodense lesions in both kidneys which are technically too small to characterize but merit observation. In particular a complex lesion anteriorly in the right kidney upper pole on image 6 of series 7 measures about 9 mm in diameter and warrants observation. Mildly thick-walled urinary bladder. Stomach/Bowel: Unremarkable Vascular/Lymphatic: Aortoiliac atherosclerotic vascular disease. There is extensive porta hepatis, retroperitoneal, and pelvic adenopathy. Porta hepatis conglomerate adenopathy on image 60 of series 2 measures 3.5 cm in short axis on image 60/2. At about the level of the bifurcation, a left periaortic node measures 2.7 cm in  short axis on image 83/2. An index right external iliac node on image 108 series 2 measures 4.8 cm in short axis on image 108/2. Numerous additional abnormally enlarged lymph nodes are present. Reproductive: Unremarkable Other: No supplemental non-categorized findings. Musculoskeletal: Unremarkable IMPRESSION: 1. New bulky and extensive supraclavicular, mediastinal, hilar, axillary, porta hepatis, retroperitoneal, and pelvic adenopathy. 2. Postoperative findings in the left kidney upper pole from partial nephrectomy. 3. The is a complex lesion in the right kidney upper pole which is too small to definitively characterize but which merits observation. Consider follow up renal protocol MRI in 6-12 months time. 4. Atherosclerosis, most notably involving the left anterior descending coronary artery. 5. Centrilobular emphysema. Electronically Signed   By: Van Clines M.D.   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   PLAN:    1.  CLL: Flow cytometry confirmed the diagnosis.  Patient is also ZAP-70 positive this which indicates it may be a more aggressive form of CLL.  Patient now has easily palpable axillary lymphadenopathy. CT results reviewed independently and reported as above with obvious progression of disease. Patient had an excellent response to Rituxan and Donnie Aho which was completed in September 2015, therefore will reinitiate treatment using Rituxan and Treanda on day 1 and then Treanda only on day 2. Plan to give 4 cycles every 28 days and then repeat imaging. Proceed with cycle 1, day 1 today. Return to clinic tomorrow for [place] only. Return to clinic in 2 weeks for laboratory work and then in 4 weeks for consideration of cycle 2. 2.  Anxiety: Continue Xanax PRN. 3.  Renal cell carcinoma: Patient is status post  partial nephrectomy on Nov 14, 2015. Continued follow-up per urology.   Approximately 30  minutes was spent in discussion of which greater than 50% was consultation.   Patient expressed understanding and was in agreement with this plan. He also understands that He can call clinic at any time with any questions, concerns, or complaints.    Lloyd Huger, MD   03/24/2016 8:37 AM

## 2016-03-21 ENCOUNTER — Inpatient Hospital Stay: Payer: Managed Care, Other (non HMO)

## 2016-03-21 ENCOUNTER — Inpatient Hospital Stay: Payer: Managed Care, Other (non HMO) | Attending: Oncology

## 2016-03-21 ENCOUNTER — Inpatient Hospital Stay (HOSPITAL_BASED_OUTPATIENT_CLINIC_OR_DEPARTMENT_OTHER): Payer: Managed Care, Other (non HMO) | Admitting: Oncology

## 2016-03-21 VITALS — BP 145/87 | HR 61 | Temp 97.1°F | Resp 18 | Wt 130.4 lb

## 2016-03-21 DIAGNOSIS — F17210 Nicotine dependence, cigarettes, uncomplicated: Secondary | ICD-10-CM

## 2016-03-21 DIAGNOSIS — F419 Anxiety disorder, unspecified: Secondary | ICD-10-CM | POA: Diagnosis not present

## 2016-03-21 DIAGNOSIS — J449 Chronic obstructive pulmonary disease, unspecified: Secondary | ICD-10-CM | POA: Insufficient documentation

## 2016-03-21 DIAGNOSIS — C642 Malignant neoplasm of left kidney, except renal pelvis: Secondary | ICD-10-CM | POA: Diagnosis not present

## 2016-03-21 DIAGNOSIS — Z5111 Encounter for antineoplastic chemotherapy: Secondary | ICD-10-CM | POA: Diagnosis not present

## 2016-03-21 DIAGNOSIS — I10 Essential (primary) hypertension: Secondary | ICD-10-CM | POA: Diagnosis not present

## 2016-03-21 DIAGNOSIS — C9110 Chronic lymphocytic leukemia of B-cell type not having achieved remission: Secondary | ICD-10-CM

## 2016-03-21 DIAGNOSIS — Z5112 Encounter for antineoplastic immunotherapy: Secondary | ICD-10-CM | POA: Diagnosis not present

## 2016-03-21 DIAGNOSIS — Z79899 Other long term (current) drug therapy: Secondary | ICD-10-CM | POA: Diagnosis not present

## 2016-03-21 LAB — CBC WITH DIFFERENTIAL/PLATELET
Basophils Absolute: 0.1 10*3/uL (ref 0–0.1)
Basophils Relative: 1 %
EOS PCT: 3 %
Eosinophils Absolute: 0.3 10*3/uL (ref 0–0.7)
HCT: 33.4 % — ABNORMAL LOW (ref 40.0–52.0)
Hemoglobin: 11.8 g/dL — ABNORMAL LOW (ref 13.0–18.0)
LYMPHS ABS: 4.8 10*3/uL — AB (ref 1.0–3.6)
LYMPHS PCT: 47 %
MCH: 32.1 pg (ref 26.0–34.0)
MCHC: 35.4 g/dL (ref 32.0–36.0)
MCV: 90.7 fL (ref 80.0–100.0)
Monocytes Absolute: 0.4 10*3/uL (ref 0.2–1.0)
Monocytes Relative: 4 %
Neutro Abs: 4.6 10*3/uL (ref 1.4–6.5)
Neutrophils Relative %: 45 %
PLATELETS: 161 10*3/uL (ref 150–440)
RBC: 3.68 MIL/uL — AB (ref 4.40–5.90)
RDW: 15.4 % — ABNORMAL HIGH (ref 11.5–14.5)
WBC: 10.1 10*3/uL (ref 3.8–10.6)

## 2016-03-21 LAB — COMPREHENSIVE METABOLIC PANEL
ALK PHOS: 56 U/L (ref 38–126)
ALT: 14 U/L — AB (ref 17–63)
AST: 22 U/L (ref 15–41)
Albumin: 4.5 g/dL (ref 3.5–5.0)
Anion gap: 5 (ref 5–15)
BUN: 11 mg/dL (ref 6–20)
CALCIUM: 8.9 mg/dL (ref 8.9–10.3)
CO2: 22 mmol/L (ref 22–32)
CREATININE: 1.36 mg/dL — AB (ref 0.61–1.24)
Chloride: 108 mmol/L (ref 101–111)
GFR, EST AFRICAN AMERICAN: 60 mL/min — AB (ref >60)
GFR, EST NON AFRICAN AMERICAN: 52 mL/min — AB (ref >60)
Glucose, Bld: 93 mg/dL (ref 65–99)
Potassium: 4 mmol/L (ref 3.5–5.1)
Sodium: 135 mmol/L (ref 135–145)
TOTAL PROTEIN: 6.6 g/dL (ref 6.5–8.1)
Total Bilirubin: 0.6 mg/dL (ref 0.3–1.2)

## 2016-03-21 MED ORDER — SODIUM CHLORIDE 0.9 % IV SOLN
Freq: Once | INTRAVENOUS | Status: AC
  Administered 2016-03-21: 10:00:00 via INTRAVENOUS
  Filled 2016-03-21: qty 1000

## 2016-03-21 MED ORDER — SODIUM CHLORIDE 0.9% FLUSH
10.0000 mL | INTRAVENOUS | Status: DC | PRN
  Administered 2016-03-21: 10 mL
  Filled 2016-03-21: qty 10

## 2016-03-21 MED ORDER — RITUXIMAB CHEMO INJECTION 500 MG/50ML
375.0000 mg/m2 | Freq: Once | INTRAVENOUS | Status: AC
  Administered 2016-03-21: 600 mg via INTRAVENOUS
  Filled 2016-03-21: qty 50

## 2016-03-21 MED ORDER — DIPHENHYDRAMINE HCL 25 MG PO CAPS
25.0000 mg | ORAL_CAPSULE | Freq: Once | ORAL | Status: AC
  Administered 2016-03-21: 25 mg via ORAL
  Filled 2016-03-21: qty 1

## 2016-03-21 MED ORDER — SODIUM CHLORIDE 0.9 % IV SOLN
90.0000 mg/m2 | Freq: Once | INTRAVENOUS | Status: AC
  Administered 2016-03-21: 150 mg via INTRAVENOUS
  Filled 2016-03-21: qty 6

## 2016-03-21 MED ORDER — HEPARIN SOD (PORK) LOCK FLUSH 100 UNIT/ML IV SOLN
500.0000 [IU] | Freq: Once | INTRAVENOUS | Status: AC | PRN
  Administered 2016-03-21: 500 [IU]
  Filled 2016-03-21 (×2): qty 5

## 2016-03-21 MED ORDER — DEXAMETHASONE SODIUM PHOSPHATE 100 MG/10ML IJ SOLN
10.0000 mg | Freq: Once | INTRAMUSCULAR | Status: AC
  Administered 2016-03-21: 10 mg via INTRAVENOUS
  Filled 2016-03-21: qty 1

## 2016-03-21 MED ORDER — PALONOSETRON HCL INJECTION 0.25 MG/5ML
0.2500 mg | Freq: Once | INTRAVENOUS | Status: AC
  Administered 2016-03-21: 0.25 mg via INTRAVENOUS
  Filled 2016-03-21: qty 5

## 2016-03-21 MED ORDER — ACETAMINOPHEN 325 MG PO TABS
650.0000 mg | ORAL_TABLET | Freq: Once | ORAL | Status: AC
  Administered 2016-03-21: 650 mg via ORAL
  Filled 2016-03-21: qty 2

## 2016-03-21 NOTE — Progress Notes (Signed)
States is feeling well. Offers no complaints. Nervous about treatment today.

## 2016-03-22 ENCOUNTER — Inpatient Hospital Stay: Payer: Managed Care, Other (non HMO)

## 2016-03-22 VITALS — BP 142/79 | HR 60 | Temp 96.5°F | Resp 18

## 2016-03-22 DIAGNOSIS — C9110 Chronic lymphocytic leukemia of B-cell type not having achieved remission: Secondary | ICD-10-CM

## 2016-03-22 MED ORDER — SODIUM CHLORIDE 0.9 % IJ SOLN
10.0000 mL | Freq: Once | INTRAMUSCULAR | Status: AC
  Administered 2016-03-22: 10 mL via INTRAVENOUS
  Filled 2016-03-22: qty 10

## 2016-03-22 MED ORDER — HEPARIN SOD (PORK) LOCK FLUSH 100 UNIT/ML IV SOLN
500.0000 [IU] | Freq: Once | INTRAVENOUS | Status: AC
  Administered 2016-03-22: 500 [IU] via INTRAVENOUS

## 2016-03-22 MED ORDER — HEPARIN SOD (PORK) LOCK FLUSH 100 UNIT/ML IV SOLN
INTRAVENOUS | Status: AC
  Filled 2016-03-22: qty 5

## 2016-03-22 MED ORDER — SODIUM CHLORIDE 0.9 % IV SOLN
Freq: Once | INTRAVENOUS | Status: AC
  Administered 2016-03-22: 09:00:00 via INTRAVENOUS
  Filled 2016-03-22: qty 1000

## 2016-03-22 MED ORDER — SODIUM CHLORIDE 0.9 % IV SOLN
10.0000 mg | Freq: Once | INTRAVENOUS | Status: AC
  Administered 2016-03-22: 10 mg via INTRAVENOUS
  Filled 2016-03-22: qty 1

## 2016-03-22 MED ORDER — SODIUM CHLORIDE 0.9 % IV SOLN
90.0000 mg/m2 | Freq: Once | INTRAVENOUS | Status: AC
  Administered 2016-03-22: 150 mg via INTRAVENOUS
  Filled 2016-03-22: qty 6

## 2016-03-29 ENCOUNTER — Other Ambulatory Visit: Payer: Self-pay | Admitting: *Deleted

## 2016-03-29 DIAGNOSIS — C9110 Chronic lymphocytic leukemia of B-cell type not having achieved remission: Secondary | ICD-10-CM

## 2016-04-02 NOTE — Progress Notes (Signed)
[place]  Telephone:(336) [phone] Fax:(336) [phone]  ID: Julian Medina OB: 1945/09/20  MR#: [id number]  XLK#:[id number]  Patient Care Team: Guadalupe Maple, MD as PCP - General (Family Medicine) Lloyd Huger, MD as Consulting Physician (Oncology) [name], MD as Consulting Physician (Urology)  CHIEF COMPLAINT: CLL  INTERVAL HISTORY: Patient returns to clinic today for further evaluation and to assess his toleration of cycle 1 of Rituxan and Treanda.  He tolerated his treatment well without significant side effects. His lymphadenopathy is essentially resolved. He continues to be highly anxious. He denies any recent fevers, night sweats, or weight loss. He has no neurologic complaints. He denies any pain. He has no chest pain or shortness of breath. He denies any nausea, vomiting, constipation, or diarrhea. He has no urinary complaints. Patient offers no further specific complaints today.   REVIEW OF SYSTEMS:   Review of Systems  Constitutional: Negative for diaphoresis, fever, malaise/fatigue and weight loss.  Respiratory: Negative.  Negative for cough and shortness of breath.   Cardiovascular: Negative.  Negative for chest pain.  Gastrointestinal: Negative.  Negative for abdominal pain.  Genitourinary: Negative.   Musculoskeletal: Negative.   Neurological: Negative.  Negative for weakness.  Psychiatric/Behavioral: The patient is nervous/anxious.     As per HPI. Otherwise, a complete review of systems is negative.  PAST MEDICAL HISTORY: Past Medical History:  Diagnosis Date  . Anxiety   . CLL (chronic lymphocytic leukemia) ([place])   . COPD (chronic obstructive pulmonary disease) ([place])   . Hypertension     PAST SURGICAL HISTORY: Past Surgical History:  Procedure Laterality Date  . HERNIA REPAIR    . PERIPHERAL VASCULAR CATHETERIZATION N/A 03/14/2016   Procedure: Glori Luis Cath Insertion;  Surgeon: Algernon Huxley, MD;  Location: [place] [facility];   Service: Cardiovascular;  Laterality: N/A;  . ROBOTIC ASSITED PARTIAL NEPHRECTOMY Left 11/14/2015   Procedure: ROBOTIC ASSITED PARTIAL NEPHRECTOMY with intraoperative drop in ultrasound / Extensive Lysis of adhesions;  Surgeon: [name], MD;  Location: [facility];  Service: Urology;  Laterality: Left;    FAMILY HISTORY: Heart disease and hypertension.     ADVANCED DIRECTIVES:    HEALTH MAINTENANCE: Social History  Substance Use Topics  . Smoking status: Current Every Day Smoker    Packs/day: 1.50    Years: 40.00    Types: Cigarettes  . Smokeless tobacco: Never Used  . Alcohol use 4.2 oz/week    7 Cans of beer per week     Comment: occasional     Colonoscopy:  PAP:  Bone density:  Lipid panel:  Allergies  Allergen Reactions  . Aspirin Other (See Comments)    Ulcer     Current Outpatient Prescriptions  Medication Sig Dispense Refill  . amLODipine (NORVASC) 10 MG tablet Take 1 tablet (10 mg total) by mouth daily. (Patient taking differently: Take 10 mg by mouth at bedtime. ) 90 tablet 4  . benazepril (LOTENSIN) 40 MG tablet 1 TABLET ONCE EACH DAY ORAL (Patient taking differently: Take 40 mg by mouth at bedtime. 1 TABLET ONCE EACH DAY ORAL) 90 tablet 4  . Fluticasone-Salmeterol (ADVAIR DISKUS) 250-50 MCG/DOSE AEPB INHALE 1 PUFF 2 TIMES A DAY 180 each 4  . lidocaine-prilocaine (EMLA) cream Apply 1 application topically as needed. Apply 1-2 hours prior to chemotherapy. Cover with plastic wrap. 30 g 2  . LORazepam (ATIVAN) 1 MG tablet Take 1 tablet (1 mg total) by mouth daily as needed for anxiety. 1/2 to 1 tab  30 tablet 1   No current facility-administered medications for this visit.     OBJECTIVE: Vitals:   04/04/16 1511  BP: (!) 150/88  Pulse: 82  Resp: 18  Temp: 97.1 F (36.2 C)     Body mass index is 17.77 kg/m.    ECOG FS:0 - Asymptomatic  General: Well-developed, well-nourished, no acute distress. Eyes: Pink conjunctiva, anicteric sclera. Lungs: Clear to  auscultation bilaterally. Heart: Regular rate and rhythm. No rubs, murmurs, or gallops. Abdomen: Soft, nontender, nondistended. No organomegaly noted, normoactive bowel sounds. Musculoskeletal: No edema, cyanosis, or clubbing. Neuro: Alert, answering all questions appropriately. Cranial nerves grossly intact. Skin: No rashes or petechiae noted. Psych: Normal affect. Lymphatics: Axillary lymphadenopathy minimally palpable.  LAB RESULTS:  Lab Results  Component Value Date   NA 135 04/04/2016   K 3.9 04/04/2016   CL 105 04/04/2016   CO2 22 04/04/2016   GLUCOSE 125 (H) 04/04/2016   BUN 17 04/04/2016   CREATININE 1.51 (H) 04/04/2016   CALCIUM 9.1 04/04/2016   PROT 6.8 04/04/2016   ALBUMIN 4.4 04/04/2016   AST 18 04/04/2016   ALT 10 (L) 04/04/2016   ALKPHOS 56 04/04/2016   BILITOT 0.5 04/04/2016   GFRNONAA 45 (L) 04/04/2016   GFRAA 53 (L) 04/04/2016    Lab Results  Component Value Date   WBC 9.0 04/04/2016   NEUTROABS 6.2 04/04/2016   HGB 12.1 (L) 04/04/2016   HCT 34.5 (L) 04/04/2016   MCV 89.5 04/04/2016   PLT 210 04/04/2016     STUDIES: No results found.  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   PLAN:    1.  CLL: Flow cytometry confirmed the diagnosis.  Patient is also ZAP-70 positive this which indicates it may be a more aggressive form of CLL. Patient's slightly lymphadenopathy has essentially resolved after 1 cycle. Previously, patient had an excellent response to Rituxan and Donnie Aho which was completed in September 2015. Because of patient's excellent response, will only give 3 cycles rather than 4 every 28 days and then repeat imaging. Return to clinic in 2 weeks for consideration of cycle 2, day 1 of Rituxan and Treanda. 2.  Anxiety: Continue Xanax PRN. 3.  Renal cell carcinoma: Patient is status post partial nephrectomy on Nov 14, 2015. Continued follow-up per urology.    Patient expressed  understanding and was in agreement with this plan. He also understands that He can call clinic at any time with any questions, concerns, or complaints.    Lloyd Huger, MD   04/08/2016 8:51 AM

## 2016-04-04 ENCOUNTER — Inpatient Hospital Stay: Payer: Managed Care, Other (non HMO)

## 2016-04-04 ENCOUNTER — Inpatient Hospital Stay (HOSPITAL_BASED_OUTPATIENT_CLINIC_OR_DEPARTMENT_OTHER): Payer: Managed Care, Other (non HMO) | Admitting: Oncology

## 2016-04-04 VITALS — BP 150/88 | HR 82 | Temp 97.1°F | Resp 18 | Wt 127.4 lb

## 2016-04-04 DIAGNOSIS — J449 Chronic obstructive pulmonary disease, unspecified: Secondary | ICD-10-CM | POA: Diagnosis not present

## 2016-04-04 DIAGNOSIS — Z79899 Other long term (current) drug therapy: Secondary | ICD-10-CM

## 2016-04-04 DIAGNOSIS — F419 Anxiety disorder, unspecified: Secondary | ICD-10-CM

## 2016-04-04 DIAGNOSIS — C9110 Chronic lymphocytic leukemia of B-cell type not having achieved remission: Secondary | ICD-10-CM

## 2016-04-04 DIAGNOSIS — Z95828 Presence of other vascular implants and grafts: Secondary | ICD-10-CM

## 2016-04-04 DIAGNOSIS — I10 Essential (primary) hypertension: Secondary | ICD-10-CM

## 2016-04-04 DIAGNOSIS — C642 Malignant neoplasm of left kidney, except renal pelvis: Secondary | ICD-10-CM

## 2016-04-04 DIAGNOSIS — F17210 Nicotine dependence, cigarettes, uncomplicated: Secondary | ICD-10-CM

## 2016-04-04 LAB — CBC WITH DIFFERENTIAL/PLATELET
BASOS PCT: 3 %
Basophils Absolute: 0.2 10*3/uL — ABNORMAL HIGH (ref 0–0.1)
EOS ABS: 0.6 10*3/uL (ref 0–0.7)
Eosinophils Relative: 7 %
HEMATOCRIT: 34.5 % — AB (ref 40.0–52.0)
Hemoglobin: 12.1 g/dL — ABNORMAL LOW (ref 13.0–18.0)
Lymphocytes Relative: 14 %
Lymphs Abs: 1.2 10*3/uL (ref 1.0–3.6)
MCH: 31.3 pg (ref 26.0–34.0)
MCHC: 35 g/dL (ref 32.0–36.0)
MCV: 89.5 fL (ref 80.0–100.0)
MONO ABS: 0.7 10*3/uL (ref 0.2–1.0)
MONOS PCT: 8 %
NEUTROS PCT: 68 %
Neutro Abs: 6.2 10*3/uL (ref 1.4–6.5)
Platelets: 210 10*3/uL (ref 150–440)
RBC: 3.86 MIL/uL — ABNORMAL LOW (ref 4.40–5.90)
RDW: 14.3 % (ref 11.5–14.5)
WBC: 9 10*3/uL (ref 3.8–10.6)

## 2016-04-04 LAB — COMPREHENSIVE METABOLIC PANEL
ALK PHOS: 56 U/L (ref 38–126)
ALT: 10 U/L — ABNORMAL LOW (ref 17–63)
ANION GAP: 8 (ref 5–15)
AST: 18 U/L (ref 15–41)
Albumin: 4.4 g/dL (ref 3.5–5.0)
BILIRUBIN TOTAL: 0.5 mg/dL (ref 0.3–1.2)
BUN: 17 mg/dL (ref 6–20)
CALCIUM: 9.1 mg/dL (ref 8.9–10.3)
CO2: 22 mmol/L (ref 22–32)
Chloride: 105 mmol/L (ref 101–111)
Creatinine, Ser: 1.51 mg/dL — ABNORMAL HIGH (ref 0.61–1.24)
GFR calc Af Amer: 53 mL/min — ABNORMAL LOW (ref >60)
GFR, EST NON AFRICAN AMERICAN: 45 mL/min — AB (ref >60)
Glucose, Bld: 125 mg/dL — ABNORMAL HIGH (ref 65–99)
POTASSIUM: 3.9 mmol/L (ref 3.5–5.1)
Sodium: 135 mmol/L (ref 135–145)
TOTAL PROTEIN: 6.8 g/dL (ref 6.5–8.1)

## 2016-04-04 MED ORDER — HEPARIN SOD (PORK) LOCK FLUSH 100 UNIT/ML IV SOLN
500.0000 [IU] | Freq: Once | INTRAVENOUS | Status: AC
  Administered 2016-04-04: 500 [IU] via INTRAVENOUS

## 2016-04-04 MED ORDER — SODIUM CHLORIDE 0.9% FLUSH
10.0000 mL | INTRAVENOUS | Status: DC | PRN
  Administered 2016-04-04: 10 mL via INTRAVENOUS
  Filled 2016-04-04: qty 10

## 2016-04-04 NOTE — Progress Notes (Signed)
States is feeling well. Tolerated treatment well.

## 2016-04-17 ENCOUNTER — Other Ambulatory Visit: Payer: Self-pay | Admitting: *Deleted

## 2016-04-17 DIAGNOSIS — C9110 Chronic lymphocytic leukemia of B-cell type not having achieved remission: Secondary | ICD-10-CM

## 2016-04-17 NOTE — Progress Notes (Signed)
[facility]  [phone] [phone]  ID: Julian Medina OB: May 08, 1946  MR#: [id number]  HYI#:[id number]  Patient Care Team: Guadalupe Maple, MD as PCP - General (Family Medicine) Lloyd Huger, MD as Consulting Physician (Oncology) [name], MD as Consulting Physician (Urology)  CHIEF COMPLAINT: CLL  INTERVAL HISTORY: Patient returns to clinic today for further evaluation and consideration of cycle 2 of Rituxan and Treanda.  He tolerated his treatment well without significant side effects. His lymphadenopathy is essentially resolved. He continues to be highly anxious. He denies any recent fevers, night sweats, or weight loss. He has no neurologic complaints. He denies any pain. He has no chest pain or shortness of breath. He denies any nausea, vomiting, constipation, or diarrhea. He has no urinary complaints. Patient offers no further specific complaints today.   REVIEW OF SYSTEMS:   Review of Systems  Constitutional: Negative for diaphoresis, fever, malaise/fatigue and weight loss.  Respiratory: Negative.  Negative for cough and shortness of breath.   Cardiovascular: Negative.  Negative for chest pain.  Gastrointestinal: Negative.  Negative for abdominal pain.  Genitourinary: Negative.   Musculoskeletal: Negative.   Neurological: Negative.  Negative for weakness.  Psychiatric/Behavioral: The patient is nervous/anxious.     As per HPI. Otherwise, a complete review of systems is negative.  PAST MEDICAL HISTORY: Past Medical History:  Diagnosis Date  . Anxiety   . CLL (chronic lymphocytic leukemia) ([place])   . COPD (chronic obstructive pulmonary disease) ([place])   . Hypertension     PAST SURGICAL HISTORY: Past Surgical History:  Procedure Laterality Date  . HERNIA REPAIR    . PERIPHERAL VASCULAR CATHETERIZATION N/A 03/14/2016   Procedure: Glori Luis Cath Insertion;  Surgeon: Algernon Huxley, MD;  Location: [facility];  Service:  Cardiovascular;  Laterality: N/A;  . ROBOTIC ASSITED PARTIAL NEPHRECTOMY Left 11/14/2015   Procedure: ROBOTIC ASSITED PARTIAL NEPHRECTOMY with intraoperative drop in ultrasound / Extensive Lysis of adhesions;  Surgeon: [name], MD;  Location: [facility];  Service: Urology;  Laterality: Left;    FAMILY HISTORY: Heart disease and hypertension.     ADVANCED DIRECTIVES:    HEALTH MAINTENANCE: Social History  Substance Use Topics  . Smoking status: Current Every Day Smoker    Packs/day: 1.50    Years: 40.00    Types: Cigarettes  . Smokeless tobacco: Never Used  . Alcohol use 4.2 oz/week    7 Cans of beer per week     Comment: occasional     Colonoscopy:  PAP:  Bone density:  Lipid panel:  Allergies  Allergen Reactions  . Aspirin Other (See Comments)    Ulcer     Current Outpatient Prescriptions  Medication Sig Dispense Refill  . amLODipine (NORVASC) 10 MG tablet Take 1 tablet (10 mg total) by mouth daily. (Patient taking differently: Take 10 mg by mouth at bedtime. ) 90 tablet 4  . benazepril (LOTENSIN) 40 MG tablet 1 TABLET ONCE EACH DAY ORAL (Patient taking differently: Take 40 mg by mouth at bedtime. 1 TABLET ONCE EACH DAY ORAL) 90 tablet 4  . Fluticasone-Salmeterol (ADVAIR DISKUS) 250-50 MCG/DOSE AEPB INHALE 1 PUFF 2 TIMES A DAY 180 each 4  . lidocaine-prilocaine (EMLA) cream Apply 1 application topically as needed. Apply 1-2 hours prior to chemotherapy. Cover with plastic wrap. 30 g 2  . LORazepam (ATIVAN) 1 MG tablet Take 1 tablet (1 mg total) by mouth daily as needed for anxiety. 1/2 to 1 tab 30 tablet 1  No current facility-administered medications for this visit.     OBJECTIVE: Vitals:   04/18/16 0857  BP: (!) 145/88  Pulse: 69  Resp: 18  Temp: (!) 95 F (35 C)     Body mass index is 17.62 kg/m.    ECOG FS:0 - Asymptomatic  General: Well-developed, well-nourished, no acute distress. Eyes: Pink conjunctiva, anicteric sclera. Lungs: Clear to  auscultation bilaterally. Heart: Regular rate and rhythm. No rubs, murmurs, or gallops. Abdomen: Soft, nontender, nondistended. No organomegaly noted, normoactive bowel sounds. Musculoskeletal: No edema, cyanosis, or clubbing. Neuro: Alert, answering all questions appropriately. Cranial nerves grossly intact. Skin: No rashes or petechiae noted. Psych: Normal affect. Lymphatics: Axillary lymphadenopathy minimally palpable.  LAB RESULTS:  Lab Results  Component Value Date   NA 136 04/18/2016   K 4.2 04/18/2016   CL 103 04/18/2016   CO2 26 04/18/2016   GLUCOSE 84 04/18/2016   BUN 15 04/18/2016   CREATININE 1.19 04/18/2016   CALCIUM 9.8 04/18/2016   PROT 7.3 04/18/2016   ALBUMIN 4.6 04/18/2016   AST 20 04/18/2016   ALT 13 (L) 04/18/2016   ALKPHOS 58 04/18/2016   BILITOT 0.5 04/18/2016   GFRNONAA >60 04/18/2016   GFRAA >60 04/18/2016    Lab Results  Component Value Date   WBC 6.6 04/18/2016   NEUTROABS 3.9 04/18/2016   HGB 13.3 04/18/2016   HCT 38.1 (L) 04/18/2016   MCV 88.0 04/18/2016   PLT 158 04/18/2016     STUDIES: No results found.  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   PLAN:    1.  CLL: Flow cytometry confirmed the diagnosis.  Patient is also ZAP-70 positive this which indicates it may be a more aggressive form of CLL. Patient's lymphadenopathy has essentially resolved after 1 cycle. Previously, patient had an excellent response to Rituxan and Donnie Aho which was completed in September 2015. Because of patient's excellent response, will only give 3 cycles rather than 4 every 28 days and then repeat imaging. Return to clinic tomorrow for [place] only and then in 3 weeks for consideration of cycle 3, day 1 of Rituxan and Treanda. 2.  Anxiety: Continue Xanax PRN. 3.  Renal cell carcinoma: Patient is status post partial nephrectomy on Nov 14, 2015. Continued follow-up per urology.    Patient expressed  understanding and was in agreement with this plan. He also understands that He can call clinic at any time with any questions, concerns, or complaints.    Lloyd Huger, MD   04/19/2016 2:05 PM

## 2016-04-18 ENCOUNTER — Inpatient Hospital Stay: Payer: Managed Care, Other (non HMO)

## 2016-04-18 ENCOUNTER — Inpatient Hospital Stay: Payer: Managed Care, Other (non HMO) | Attending: Oncology

## 2016-04-18 ENCOUNTER — Inpatient Hospital Stay (HOSPITAL_BASED_OUTPATIENT_CLINIC_OR_DEPARTMENT_OTHER): Payer: Managed Care, Other (non HMO) | Admitting: Oncology

## 2016-04-18 VITALS — BP 145/88 | HR 69 | Temp 95.0°F | Resp 18 | Wt 126.3 lb

## 2016-04-18 DIAGNOSIS — Z5111 Encounter for antineoplastic chemotherapy: Secondary | ICD-10-CM | POA: Diagnosis not present

## 2016-04-18 DIAGNOSIS — I10 Essential (primary) hypertension: Secondary | ICD-10-CM | POA: Insufficient documentation

## 2016-04-18 DIAGNOSIS — C642 Malignant neoplasm of left kidney, except renal pelvis: Secondary | ICD-10-CM | POA: Diagnosis not present

## 2016-04-18 DIAGNOSIS — C9110 Chronic lymphocytic leukemia of B-cell type not having achieved remission: Secondary | ICD-10-CM

## 2016-04-18 DIAGNOSIS — F17210 Nicotine dependence, cigarettes, uncomplicated: Secondary | ICD-10-CM | POA: Insufficient documentation

## 2016-04-18 DIAGNOSIS — Z79899 Other long term (current) drug therapy: Secondary | ICD-10-CM | POA: Insufficient documentation

## 2016-04-18 DIAGNOSIS — J449 Chronic obstructive pulmonary disease, unspecified: Secondary | ICD-10-CM

## 2016-04-18 DIAGNOSIS — F419 Anxiety disorder, unspecified: Secondary | ICD-10-CM | POA: Insufficient documentation

## 2016-04-18 DIAGNOSIS — Z905 Acquired absence of kidney: Secondary | ICD-10-CM | POA: Insufficient documentation

## 2016-04-18 LAB — COMPREHENSIVE METABOLIC PANEL
ALK PHOS: 58 U/L (ref 38–126)
ALT: 13 U/L — AB (ref 17–63)
ANION GAP: 7 (ref 5–15)
AST: 20 U/L (ref 15–41)
Albumin: 4.6 g/dL (ref 3.5–5.0)
BILIRUBIN TOTAL: 0.5 mg/dL (ref 0.3–1.2)
BUN: 15 mg/dL (ref 6–20)
CALCIUM: 9.8 mg/dL (ref 8.9–10.3)
CO2: 26 mmol/L (ref 22–32)
CREATININE: 1.19 mg/dL (ref 0.61–1.24)
Chloride: 103 mmol/L (ref 101–111)
GFR calc non Af Amer: >60 mL/min (ref >60)
Glucose, Bld: 84 mg/dL (ref 65–99)
Potassium: 4.2 mmol/L (ref 3.5–5.1)
SODIUM: 136 mmol/L (ref 135–145)
TOTAL PROTEIN: 7.3 g/dL (ref 6.5–8.1)

## 2016-04-18 LAB — CBC WITH DIFFERENTIAL/PLATELET
Basophils Absolute: 0.2 10*3/uL — ABNORMAL HIGH (ref 0–0.1)
Basophils Relative: 3 %
Eosinophils Absolute: 0.6 10*3/uL (ref 0–0.7)
Eosinophils Relative: 9 %
HEMATOCRIT: 38.1 % — AB (ref 40.0–52.0)
HEMOGLOBIN: 13.3 g/dL (ref 13.0–18.0)
LYMPHS ABS: 1.3 10*3/uL (ref 1.0–3.6)
LYMPHS PCT: 20 %
MCH: 30.8 pg (ref 26.0–34.0)
MCHC: 35.1 g/dL (ref 32.0–36.0)
MCV: 88 fL (ref 80.0–100.0)
MONOS PCT: 10 %
Monocytes Absolute: 0.7 10*3/uL (ref 0.2–1.0)
NEUTROS ABS: 3.9 10*3/uL (ref 1.4–6.5)
NEUTROS PCT: 58 %
Platelets: 158 10*3/uL (ref 150–440)
RBC: 4.33 MIL/uL — AB (ref 4.40–5.90)
RDW: 14.1 % (ref 11.5–14.5)
WBC: 6.6 10*3/uL (ref 3.8–10.6)

## 2016-04-18 MED ORDER — SODIUM CHLORIDE 0.9 % IV SOLN
Freq: Once | INTRAVENOUS | Status: AC
Start: 2016-04-18 — End: 2016-04-18
  Administered 2016-04-18: 10:00:00 via INTRAVENOUS
  Filled 2016-04-18: qty 1000

## 2016-04-18 MED ORDER — SODIUM CHLORIDE 0.9 % IV SOLN: 375.0000 mg/m2 | Freq: Once | INTRAVENOUS | Status: DC

## 2016-04-18 MED ORDER — DIPHENHYDRAMINE HCL 25 MG PO CAPS
25.0000 mg | ORAL_CAPSULE | Freq: Once | ORAL | Status: AC
  Administered 2016-04-18: 25 mg via ORAL
  Filled 2016-04-18: qty 1

## 2016-04-18 MED ORDER — SODIUM CHLORIDE 0.9 % IV SOLN
10.0000 mg | Freq: Once | INTRAVENOUS | Status: AC
  Administered 2016-04-18: 10 mg via INTRAVENOUS
  Filled 2016-04-18: qty 1

## 2016-04-18 MED ORDER — PALONOSETRON HCL INJECTION 0.25 MG/5ML
0.2500 mg | Freq: Once | INTRAVENOUS | Status: AC
  Administered 2016-04-18: 0.25 mg via INTRAVENOUS
  Filled 2016-04-18: qty 5

## 2016-04-18 MED ORDER — HEPARIN SOD (PORK) LOCK FLUSH 100 UNIT/ML IV SOLN
500.0000 [IU] | Freq: Once | INTRAVENOUS | Status: AC
  Administered 2016-04-18: 500 [IU] via INTRAVENOUS
  Filled 2016-04-18: qty 5

## 2016-04-18 MED ORDER — SODIUM CHLORIDE 0.9 % IV SOLN
90.0000 mg/m2 | Freq: Once | INTRAVENOUS | Status: AC
  Administered 2016-04-18: 150 mg via INTRAVENOUS
  Filled 2016-04-18: qty 6

## 2016-04-18 MED ORDER — ACETAMINOPHEN 325 MG PO TABS
650.0000 mg | ORAL_TABLET | Freq: Once | ORAL | Status: AC
  Administered 2016-04-18: 650 mg via ORAL
  Filled 2016-04-18: qty 2

## 2016-04-18 MED ORDER — SODIUM CHLORIDE 0.9 % IJ SOLN
10.0000 mL | Freq: Once | INTRAMUSCULAR | Status: AC
  Administered 2016-04-18: 10 mL via INTRAVENOUS
  Filled 2016-04-18: qty 10

## 2016-04-18 MED ORDER — SODIUM CHLORIDE 0.9 % IV SOLN
375.0000 mg/m2 | Freq: Once | INTRAVENOUS | Status: AC
  Administered 2016-04-18: 600 mg via INTRAVENOUS
  Filled 2016-04-18: qty 50

## 2016-04-18 NOTE — Progress Notes (Signed)
States is feeling well. Offers no complaints. 

## 2016-04-19 ENCOUNTER — Inpatient Hospital Stay: Payer: Managed Care, Other (non HMO)

## 2016-04-19 VITALS — BP 135/79 | HR 66 | Temp 95.9°F | Resp 18

## 2016-04-19 DIAGNOSIS — C9110 Chronic lymphocytic leukemia of B-cell type not having achieved remission: Secondary | ICD-10-CM

## 2016-04-19 MED ORDER — SODIUM CHLORIDE 0.9 % IV SOLN
90.0000 mg/m2 | Freq: Once | INTRAVENOUS | Status: AC
  Administered 2016-04-19: 150 mg via INTRAVENOUS
  Filled 2016-04-19: qty 6

## 2016-04-19 MED ORDER — SODIUM CHLORIDE 0.9 % IV SOLN
10.0000 mg | Freq: Once | INTRAVENOUS | Status: AC
  Administered 2016-04-19: 10 mg via INTRAVENOUS
  Filled 2016-04-19: qty 1

## 2016-04-19 MED ORDER — HEPARIN SOD (PORK) LOCK FLUSH 100 UNIT/ML IV SOLN
500.0000 [IU] | Freq: Once | INTRAVENOUS | Status: AC | PRN
  Administered 2016-04-19: 500 [IU]
  Filled 2016-04-19: qty 5

## 2016-04-19 MED ORDER — SODIUM CHLORIDE 0.9 % IV SOLN
Freq: Once | INTRAVENOUS | Status: AC
  Administered 2016-04-19: 09:00:00 via INTRAVENOUS
  Filled 2016-04-19: qty 1000

## 2016-04-29 ENCOUNTER — Telehealth: Payer: Self-pay | Admitting: Family Medicine

## 2016-04-29 NOTE — Telephone Encounter (Signed)
Pt has an appt 05/06/16 for his CPE and has a port and would like to know where he would get his labs done.

## 2016-04-29 NOTE — Telephone Encounter (Signed)
Where his port isk used generally at the [facility]

## 2016-04-29 NOTE — Telephone Encounter (Signed)
Can patient get labs done here with a port or the hospital?

## 2016-04-29 NOTE — Telephone Encounter (Signed)
Tried calling, line busy

## 2016-04-30 NOTE — Telephone Encounter (Signed)
Patient called back. He said he's sometimes a difficult stick. Advised him that we'd try it here and if no success, then we'd give him a lab order to take back to Oncology. He agreed with that plan.

## 2016-04-30 NOTE — Telephone Encounter (Signed)
Per the cancer center, he can have a regular blood draw here. It doesn't have to be done thru the port. They did run a CBC and CMP on 04/18/16 so we probably won't need to repeat those.

## 2016-04-30 NOTE — Telephone Encounter (Signed)
Left message for a cancer center nurse to call me.

## 2016-04-30 NOTE — Telephone Encounter (Signed)
Patient notified. His phone did lose connection and disconnected after I had told him.

## 2016-05-06 ENCOUNTER — Ambulatory Visit (INDEPENDENT_AMBULATORY_CARE_PROVIDER_SITE_OTHER): Payer: Managed Care, Other (non HMO) | Admitting: Family Medicine

## 2016-05-06 ENCOUNTER — Encounter: Payer: Self-pay | Admitting: Family Medicine

## 2016-05-06 VITALS — BP 147/79 | HR 78 | Temp 97.9°F | Ht 69.75 in | Wt 128.0 lb

## 2016-05-06 DIAGNOSIS — I10 Essential (primary) hypertension: Secondary | ICD-10-CM

## 2016-05-06 DIAGNOSIS — Z0000 Encounter for general adult medical examination without abnormal findings: Secondary | ICD-10-CM

## 2016-05-06 DIAGNOSIS — J42 Unspecified chronic bronchitis: Secondary | ICD-10-CM | POA: Diagnosis not present

## 2016-05-06 DIAGNOSIS — C9110 Chronic lymphocytic leukemia of B-cell type not having achieved remission: Secondary | ICD-10-CM

## 2016-05-06 DIAGNOSIS — N400 Enlarged prostate without lower urinary tract symptoms: Secondary | ICD-10-CM | POA: Diagnosis not present

## 2016-05-06 DIAGNOSIS — L723 Sebaceous cyst: Secondary | ICD-10-CM | POA: Diagnosis not present

## 2016-05-06 LAB — URINALYSIS, ROUTINE W REFLEX MICROSCOPIC
Bilirubin, UA: NEGATIVE
GLUCOSE, UA: NEGATIVE
KETONES UA: NEGATIVE
NITRITE UA: NEGATIVE
Protein, UA: NEGATIVE
RBC, UA: NEGATIVE
SPEC GRAV UA: 1.01 (ref 1.005–1.030)
Urobilinogen, Ur: 0.2 mg/dL (ref 0.2–1.0)
pH, UA: 6 (ref 5.0–7.5)

## 2016-05-06 LAB — MICROSCOPIC EXAMINATION
BACTERIA UA: NONE SEEN
RBC, UA: NONE SEEN /hpf (ref 0–?)

## 2016-05-06 MED ORDER — HYDROCHLOROTHIAZIDE 25 MG PO TABS: 25.0000 mg | ORAL_TABLET | Freq: Every day | ORAL | 2 refills | Status: DC

## 2016-05-06 MED ORDER — AMLODIPINE BESYLATE 10 MG PO TABS: 10.0000 mg | ORAL_TABLET | Freq: Every day | ORAL | 4 refills | Status: DC

## 2016-05-06 MED ORDER — BENAZEPRIL HCL 40 MG PO TABS: ORAL_TABLET | ORAL | 4 refills | Status: DC

## 2016-05-06 NOTE — Assessment & Plan Note (Signed)
Discussed encourage patient to quit smoking patient understands

## 2016-05-06 NOTE — Progress Notes (Signed)
BP (!) 147/79   Pulse 78   Temp 97.9 F (36.6 C)   Ht 5' 9.75" (1.772 m)   Wt 128 lb (58.1 kg)   SpO2 99%   BMI 18.50 kg/m    Subjective:    Patient ID: Julian Medina, male    DOB: 16-Jan-1946, 70 y.o.   MRN: [id number]  HPI: Julian Medina is a 70 y.o. male  Annual exam AWV metrics met Enlarging red inflamed painful sebaceous cyst left earlobe is been ongoing for several weeks and getting worse. Patient getting good reports from the [facility] will be finishing up chemotherapy and may even be graduating from the [facility]. Blood pressure doing well taking medications faithfully but taking at nighttime On review blood pressures been elevated at [facility] just about all year. Patient will need more medications.  Relevant past medical, surgical, family and social history reviewed and updated as indicated. Interim medical history since our last visit reviewed. Allergies and medications reviewed and updated.  Review of Systems  Constitutional: Negative.   HENT: Negative.   Eyes: Negative.   Respiratory: Negative.   Cardiovascular: Negative.   Gastrointestinal: Negative.   Endocrine: Negative.   Genitourinary: Negative.   Musculoskeletal: Negative.   Skin: Negative.   Allergic/Immunologic: Negative.   Neurological: Negative.   Hematological: Negative.   Psychiatric/Behavioral: Negative.     Per HPI unless specifically indicated above     Objective:    BP (!) 147/79   Pulse 78   Temp 97.9 F (36.6 C)   Ht 5' 9.75" (1.772 m)   Wt 128 lb (58.1 kg)   SpO2 99%   BMI 18.50 kg/m   Wt Readings from Last 3 Encounters:  05/06/16 128 lb (58.1 kg)  04/18/16 126 lb 5.2 oz (57.3 kg)  04/04/16 127 lb 6.8 oz (57.8 kg)    Physical Exam  Constitutional: He is oriented to person, place, and time. He appears well-developed and well-nourished.  HENT:  Head: Normocephalic and atraumatic.  Right Ear: External ear normal.  Left Ear: External ear normal.  Eyes:  Conjunctivae and EOM are normal. Pupils are equal, round, and reactive to light.  Neck: Normal range of motion. Neck supple.  Cardiovascular: Normal rate, regular rhythm, normal heart sounds and intact distal pulses.   Pulmonary/Chest: Effort normal and breath sounds normal.  Abdominal: Soft. Bowel sounds are normal. There is no splenomegaly or hepatomegaly.  Genitourinary: Rectum normal and penis normal.  Genitourinary Comments: Prostate enlarged  Musculoskeletal: Normal range of motion.  Neurological: He is alert and oriented to person, place, and time. He has normal reflexes.  Skin: No rash noted. No erythema.  Agent with large sebaceous cyst left ear which was prepped with Betadine and alcohol and infiltrated with Xylocaine and sinus and drinks large amount of Prelone sebaceous material with resolution of cyst. Patient tolerated the procedure well.  Psychiatric: He has a normal mood and affect. His behavior is normal. Judgment and thought content normal.    Results for orders placed or performed in visit on 04/18/16  CBC with Differential/Platelet  Result Value Ref Range   WBC 6.6 3.8 - 10.6 K/uL   RBC 4.33 (L) 4.40 - 5.90 MIL/uL   Hemoglobin 13.3 13.0 - 18.0 g/dL   HCT 38.1 (L) 40.0 - 52.0 %   MCV 88.0 80.0 - 100.0 fL   MCH 30.8 26.0 - 34.0 pg   MCHC 35.1 32.0 - 36.0 g/dL   RDW 14.1 11.5 -  14.5 %   Platelets 158 150 - 440 K/uL   Neutrophils Relative % 58 %   Neutro Abs 3.9 1.4 - 6.5 K/uL   Lymphocytes Relative 20 %   Lymphs Abs 1.3 1.0 - 3.6 K/uL   Monocytes Relative 10 %   Monocytes Absolute 0.7 0.2 - 1.0 K/uL   Eosinophils Relative 9 %   Eosinophils Absolute 0.6 0 - 0.7 K/uL   Basophils Relative 3 %   Basophils Absolute 0.2 (H) 0 - 0.1 K/uL  Comprehensive metabolic panel  Result Value Ref Range   Sodium 136 135 - 145 mmol/L   Potassium 4.2 3.5 - 5.1 mmol/L   Chloride 103 101 - 111 mmol/L   CO2 26 22 - 32 mmol/L   Glucose, Bld 84 65 - 99 mg/dL   BUN 15 6 - 20 mg/dL    Creatinine, Ser 1.19 0.61 - 1.24 mg/dL   Calcium 9.8 8.9 - 10.3 mg/dL   Total Protein 7.3 6.5 - 8.1 g/dL   Albumin 4.6 3.5 - 5.0 g/dL   AST 20 15 - 41 U/L   ALT 13 (L) 17 - 63 U/L   Alkaline Phosphatase 58 38 - 126 U/L   Total Bilirubin 0.5 0.3 - 1.2 mg/dL   GFR calc non Af Amer >60 >60 mL/min   GFR calc Af Amer >60 >60 mL/min   Anion gap 7 5 - 15      Assessment & Plan:   Problem List Items Addressed This Visit      Cardiovascular and Mediastinum   Essential hypertension - Primary    Discuss poor control will change medication timing to morning will add HCT 25 mg      Relevant Medications   benazepril (LOTENSIN) 40 MG tablet   amLODipine (NORVASC) 10 MG tablet   hydrochlorothiazide (HYDRODIURIL) 25 MG tablet   Other Relevant Orders   Urinalysis, Routine w reflex microscopic (not at [facility])   TSH   Lipid panel     Respiratory   COPD (chronic obstructive pulmonary disease) (HCC)    Discussed encourage patient to quit smoking patient understands        Nervous and Auditory   Sebaceous cyst of ear    I&D done see note above        Genitourinary   BPH (benign prostatic hyperplasia)   Relevant Orders   Urinalysis, Routine w reflex microscopic (not at [facility])   PSA     Other   Chronic lymphocytic leukemia ([place])    Good reports from the cancer Center      Relevant Orders   Lipid panel    Other Visit Diagnoses    PE (physical exam), annual       Relevant Orders   Urinalysis, Routine w reflex microscopic (not at [facility])   TSH   Lipid panel   PSA       Follow up plan: Return in about 4 weeks (around 06/03/2016) for  bp check.

## 2016-05-06 NOTE — Assessment & Plan Note (Signed)
Discuss poor control will change medication timing to morning will add HCT 25 mg

## 2016-05-06 NOTE — Assessment & Plan Note (Signed)
I&D done see note above

## 2016-05-06 NOTE — Assessment & Plan Note (Signed)
Good reports from the [facility]

## 2016-05-07 LAB — LIPID PANEL
CHOLESTEROL TOTAL: 246 mg/dL — AB (ref 100–199)
Chol/HDL Ratio: 2.7 ratio units (ref 0.0–5.0)
HDL: 91 mg/dL (ref >39)
LDL Calculated: 125 mg/dL — ABNORMAL HIGH (ref 0–99)
Triglycerides: 150 mg/dL — ABNORMAL HIGH (ref 0–149)
VLDL CHOLESTEROL CAL: 30 mg/dL (ref 5–40)

## 2016-05-07 LAB — PSA: PROSTATE SPECIFIC AG, SERUM: 1.5 ng/mL (ref 0.0–4.0)

## 2016-05-07 LAB — TSH: TSH: 1.26 u[IU]/mL (ref 0.450–4.500)

## 2016-05-08 ENCOUNTER — Encounter: Payer: Self-pay | Admitting: Family Medicine

## 2016-05-12 ENCOUNTER — Other Ambulatory Visit: Payer: Self-pay | Admitting: Family Medicine

## 2016-05-12 DIAGNOSIS — J42 Unspecified chronic bronchitis: Secondary | ICD-10-CM

## 2016-05-15 NOTE — Progress Notes (Signed)
Julian Medina  Telephone:(336) [phone] Fax:(336) [phone]  ID: HALL BIRCHARD OB: 1945/08/08  MR#: [id number]  CSN#:[id number]  Patient Care Team: Guadalupe Maple, MD as PCP - General (Family Medicine) Lloyd Huger, MD as Consulting Physician (Oncology) [name], MD as Consulting Physician (Urology)  CHIEF COMPLAINT: CLL  INTERVAL HISTORY: Patient returns to clinic today for further evaluation and consideration of cycle 3 of Rituxan and Treanda.  He is tolerating his treatments well without significant side effects. His lymphadenopathy is essentially resolved. He continues to be highly anxious. He denies any recent fevers, night sweats, or weight loss. He has no neurologic complaints. He denies any pain. He has no chest pain or shortness of breath. He denies any nausea, vomiting, constipation, or diarrhea. He has no urinary complaints. Patient offers no further specific complaints today.   REVIEW OF SYSTEMS:   Review of Systems  Constitutional: Negative for diaphoresis, fever, malaise/fatigue and weight loss.  Respiratory: Negative.  Negative for cough and shortness of breath.   Cardiovascular: Negative.  Negative for chest pain.  Gastrointestinal: Negative.  Negative for abdominal pain.  Genitourinary: Negative.   Musculoskeletal: Negative.   Neurological: Negative.  Negative for weakness.  Psychiatric/Behavioral: The patient is nervous/anxious.     As per HPI. Otherwise, a complete review of systems is negative.  PAST MEDICAL HISTORY: Past Medical History:  Diagnosis Date  . Anxiety   . CLL (chronic lymphocytic leukemia) ([place])   . COPD (chronic obstructive pulmonary disease) ([place])     PAST SURGICAL HISTORY: Past Surgical History:  Procedure Laterality Date  . HERNIA REPAIR    . PERIPHERAL VASCULAR CATHETERIZATION N/A 03/14/2016   Procedure: Glori Luis Cath Insertion;  Surgeon: Algernon Huxley, MD;  Location: [facility];  Service: Cardiovascular;   Laterality: N/A;  . ROBOTIC ASSITED PARTIAL NEPHRECTOMY Left 11/14/2015   Procedure: ROBOTIC ASSITED PARTIAL NEPHRECTOMY with intraoperative drop in ultrasound / Extensive Lysis of adhesions;  Surgeon: [name], MD;  Location: [facility];  Service: Urology;  Laterality: Left;    FAMILY HISTORY: Heart disease and hypertension.     ADVANCED DIRECTIVES:    HEALTH MAINTENANCE: Social History  Substance Use Topics  . Smoking status: Current Every Day Smoker    Packs/day: 1.50    Years: 40.00    Types: Cigarettes  . Smokeless tobacco: Never Used  . Alcohol use 4.2 oz/week    7 Cans of beer per week     Comment: occasional     Colonoscopy:  PAP:  Bone density:  Lipid panel:  Allergies  Allergen Reactions  . Aspirin Other (See Comments)    Ulcer     Current Outpatient Prescriptions  Medication Sig Dispense Refill  . ADVAIR DISKUS 250-50 MCG/DOSE AEPB INHALE 1 PUFF 2 TIMES A DAY 180 each 3  . amLODipine (NORVASC) 10 MG tablet Take 1 tablet (10 mg total) by mouth daily. 90 tablet 4  . benazepril (LOTENSIN) 40 MG tablet 1 TABLET ONCE EACH DAY ORAL 90 tablet 4  . hydrochlorothiazide (HYDRODIURIL) 25 MG tablet Take 1 tablet (25 mg total) by mouth daily. 30 tablet 2  . lidocaine-prilocaine (EMLA) cream Apply 1 application topically as needed. Apply 1-2 hours prior to chemotherapy. Cover with plastic wrap. 30 g 2  . LORazepam (ATIVAN) 1 MG tablet Take 1 tablet (1 mg total) by mouth daily as needed for anxiety. 1/2 to 1 tab 30 tablet 1   No current facility-administered medications for this visit.  OBJECTIVE: Vitals:   05/16/16 0905  BP: 131/81  Pulse: 68  Resp: 18  Temp: 97.7 F (36.5 C)     Body mass index is 18.43 kg/m.    ECOG FS:0 - Asymptomatic  General: Well-developed, well-nourished, no acute distress. Eyes: Pink conjunctiva, anicteric sclera. Lungs: Clear to auscultation bilaterally. Heart: Regular rate and rhythm. No rubs, murmurs, or gallops. Abdomen:  Soft, nontender, nondistended. No organomegaly noted, normoactive bowel sounds. Musculoskeletal: No edema, cyanosis, or clubbing. Neuro: Alert, answering all questions appropriately. Cranial nerves grossly intact. Skin: No rashes or petechiae noted. Psych: Normal affect. Lymphatics: No palpable axillary or cervical lymphadenopathy.  LAB RESULTS:  Lab Results  Component Value Date   NA 133 (L) 05/16/2016   K 4.4 05/16/2016   CL 102 05/16/2016   CO2 25 05/16/2016   GLUCOSE 85 05/16/2016   BUN 21 (H) 05/16/2016   CREATININE 1.53 (H) 05/16/2016   CALCIUM 9.6 05/16/2016   PROT 7.3 05/16/2016   ALBUMIN 4.6 05/16/2016   AST 20 05/16/2016   ALT 12 (L) 05/16/2016   ALKPHOS 61 05/16/2016   BILITOT 0.3 05/16/2016   GFRNONAA 45 (L) 05/16/2016   GFRAA 52 (L) 05/16/2016    Lab Results  Component Value Date   WBC 6.7 05/16/2016   NEUTROABS 3.6 05/16/2016   HGB 14.0 05/16/2016   HCT 39.3 (L) 05/16/2016   MCV 86.2 05/16/2016   PLT 174 05/16/2016     STUDIES: No results found.  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   PLAN:    1.  CLL: Flow cytometry confirmed the diagnosis.  Patient is also ZAP-70 positive this which indicates it may be a more aggressive form of CLL. Patient's lymphadenopathy has essentially resolved after 1 cycle. Previously, patient had an excellent response to Rituxan and Donnie Aho which was completed in September 2015. Because of patient's excellent response, will only give 3 cycles rather than 4 every 28 days and then repeat imaging. Return to clinic tomorrow for [place] only and then in 3 months with repeat imaging using CT scan and further evaluation.  2.  Anxiety: Continue Xanax PRN. 3.  Renal cell carcinoma: Patient is status post partial nephrectomy on Nov 14, 2015. Continued follow-up per urology.  4. Renal insufficiency: Patient's creatinine slightly elevated today, monitor.   Patient  expressed understanding and was in agreement with this plan. He also understands that He can call clinic at any time with any questions, concerns, or complaints.    Lloyd Huger, MD   05/16/2016 9:58 AM

## 2016-05-16 ENCOUNTER — Inpatient Hospital Stay (HOSPITAL_BASED_OUTPATIENT_CLINIC_OR_DEPARTMENT_OTHER): Payer: Managed Care, Other (non HMO) | Admitting: Oncology

## 2016-05-16 ENCOUNTER — Inpatient Hospital Stay: Payer: Managed Care, Other (non HMO)

## 2016-05-16 ENCOUNTER — Inpatient Hospital Stay: Payer: Managed Care, Other (non HMO) | Attending: Oncology

## 2016-05-16 VITALS — BP 131/81 | HR 68 | Temp 97.7°F | Resp 18 | Wt 127.5 lb

## 2016-05-16 VITALS — BP 106/68 | HR 70 | Resp 20

## 2016-05-16 DIAGNOSIS — Z79899 Other long term (current) drug therapy: Secondary | ICD-10-CM | POA: Insufficient documentation

## 2016-05-16 DIAGNOSIS — J449 Chronic obstructive pulmonary disease, unspecified: Secondary | ICD-10-CM

## 2016-05-16 DIAGNOSIS — C9110 Chronic lymphocytic leukemia of B-cell type not having achieved remission: Secondary | ICD-10-CM | POA: Insufficient documentation

## 2016-05-16 DIAGNOSIS — F17210 Nicotine dependence, cigarettes, uncomplicated: Secondary | ICD-10-CM | POA: Diagnosis not present

## 2016-05-16 DIAGNOSIS — F419 Anxiety disorder, unspecified: Secondary | ICD-10-CM | POA: Insufficient documentation

## 2016-05-16 DIAGNOSIS — N2889 Other specified disorders of kidney and ureter: Secondary | ICD-10-CM | POA: Diagnosis not present

## 2016-05-16 DIAGNOSIS — C642 Malignant neoplasm of left kidney, except renal pelvis: Secondary | ICD-10-CM | POA: Insufficient documentation

## 2016-05-16 DIAGNOSIS — Z5111 Encounter for antineoplastic chemotherapy: Secondary | ICD-10-CM | POA: Insufficient documentation

## 2016-05-16 LAB — CBC WITH DIFFERENTIAL/PLATELET
BASOS PCT: 3 %
Band Neutrophils: 0 %
Basophils Absolute: 0.2 10*3/uL — ABNORMAL HIGH (ref 0–0.1)
Blasts: 0 %
EOS PCT: 10 %
Eosinophils Absolute: 0.7 10*3/uL (ref 0–0.7)
HCT: 39.3 % — ABNORMAL LOW (ref 40.0–52.0)
Hemoglobin: 14 g/dL (ref 13.0–18.0)
LYMPHS ABS: 1.3 10*3/uL (ref 1.0–3.6)
Lymphocytes Relative: 19 %
MCH: 30.6 pg (ref 26.0–34.0)
MCHC: 35.5 g/dL (ref 32.0–36.0)
MCV: 86.2 fL (ref 80.0–100.0)
MONO ABS: 0.9 10*3/uL (ref 0.2–1.0)
MONOS PCT: 14 %
Metamyelocytes Relative: 0 %
Myelocytes: 0 %
NEUTROS ABS: 3.6 10*3/uL (ref 1.4–6.5)
NEUTROS PCT: 54 %
NRBC: 0 /100{WBCs}
OTHER: 0 %
PLATELETS: 174 10*3/uL (ref 150–440)
Promyelocytes Absolute: 0 %
RBC: 4.56 MIL/uL (ref 4.40–5.90)
RDW: 14.3 % (ref 11.5–14.5)
WBC: 6.7 10*3/uL (ref 3.8–10.6)

## 2016-05-16 LAB — COMPREHENSIVE METABOLIC PANEL
ALT: 12 U/L — AB (ref 17–63)
ANION GAP: 6 (ref 5–15)
AST: 20 U/L (ref 15–41)
Albumin: 4.6 g/dL (ref 3.5–5.0)
Alkaline Phosphatase: 61 U/L (ref 38–126)
BUN: 21 mg/dL — ABNORMAL HIGH (ref 6–20)
CHLORIDE: 102 mmol/L (ref 101–111)
CO2: 25 mmol/L (ref 22–32)
CREATININE: 1.53 mg/dL — AB (ref 0.61–1.24)
Calcium: 9.6 mg/dL (ref 8.9–10.3)
GFR, EST AFRICAN AMERICAN: 52 mL/min — AB (ref >60)
GFR, EST NON AFRICAN AMERICAN: 45 mL/min — AB (ref >60)
Glucose, Bld: 85 mg/dL (ref 65–99)
POTASSIUM: 4.4 mmol/L (ref 3.5–5.1)
SODIUM: 133 mmol/L — AB (ref 135–145)
Total Bilirubin: 0.3 mg/dL (ref 0.3–1.2)
Total Protein: 7.3 g/dL (ref 6.5–8.1)

## 2016-05-16 MED ORDER — ACETAMINOPHEN 325 MG PO TABS
650.0000 mg | ORAL_TABLET | Freq: Once | ORAL | Status: AC
  Administered 2016-05-16: 650 mg via ORAL
  Filled 2016-05-16: qty 2

## 2016-05-16 MED ORDER — DIPHENHYDRAMINE HCL 25 MG PO CAPS
25.0000 mg | ORAL_CAPSULE | Freq: Once | ORAL | Status: AC
  Administered 2016-05-16: 25 mg via ORAL
  Filled 2016-05-16: qty 1

## 2016-05-16 MED ORDER — PALONOSETRON HCL INJECTION 0.25 MG/5ML
0.2500 mg | Freq: Once | INTRAVENOUS | Status: AC
  Administered 2016-05-16: 0.25 mg via INTRAVENOUS
  Filled 2016-05-16: qty 5

## 2016-05-16 MED ORDER — DEXAMETHASONE SODIUM PHOSPHATE 10 MG/ML IJ SOLN: 10.0000 mg | Freq: Once | INTRAMUSCULAR | Status: DC

## 2016-05-16 MED ORDER — HEPARIN SOD (PORK) LOCK FLUSH 100 UNIT/ML IV SOLN
500.0000 [IU] | Freq: Once | INTRAVENOUS | Status: AC | PRN
  Administered 2016-05-16: 500 [IU]
  Filled 2016-05-16: qty 5

## 2016-05-16 MED ORDER — SODIUM CHLORIDE 0.9 % IV SOLN: 10.0000 mg | Freq: Once | INTRAVENOUS | Status: DC

## 2016-05-16 MED ORDER — SODIUM CHLORIDE 0.9 % IV SOLN
375.0000 mg/m2 | Freq: Once | INTRAVENOUS | Status: AC
  Administered 2016-05-16: 600 mg via INTRAVENOUS
  Filled 2016-05-16: qty 50

## 2016-05-16 MED ORDER — DEXAMETHASONE SODIUM PHOSPHATE 10 MG/ML IJ SOLN
10.0000 mg | Freq: Once | INTRAMUSCULAR | Status: AC
  Administered 2016-05-16: 10 mg via INTRAVENOUS
  Filled 2016-05-16: qty 1

## 2016-05-16 MED ORDER — SODIUM CHLORIDE 0.9 % IV SOLN
Freq: Once | INTRAVENOUS | Status: AC
  Administered 2016-05-16: 10:00:00 via INTRAVENOUS
  Filled 2016-05-16: qty 1000

## 2016-05-16 MED ORDER — SODIUM CHLORIDE 0.9 % IV SOLN
90.0000 mg/m2 | Freq: Once | INTRAVENOUS | Status: AC
  Administered 2016-05-16: 150 mg via INTRAVENOUS
  Filled 2016-05-16: qty 6

## 2016-05-16 MED ORDER — SODIUM CHLORIDE 0.9 % IV SOLN: 375.0000 mg/m2 | Freq: Once | INTRAVENOUS | Status: DC

## 2016-05-16 NOTE — Progress Notes (Signed)
States is feeling well. Offers no complaints. 

## 2016-05-17 ENCOUNTER — Inpatient Hospital Stay: Payer: Managed Care, Other (non HMO)

## 2016-05-17 VITALS — BP 119/77 | HR 67 | Temp 96.4°F | Resp 18

## 2016-05-17 DIAGNOSIS — C9110 Chronic lymphocytic leukemia of B-cell type not having achieved remission: Secondary | ICD-10-CM

## 2016-05-17 MED ORDER — DEXAMETHASONE SODIUM PHOSPHATE 10 MG/ML IJ SOLN
10.0000 mg | Freq: Once | INTRAMUSCULAR | Status: AC
Start: 2016-05-17 — End: 2016-05-17
  Administered 2016-05-17: 10 mg via INTRAVENOUS
  Filled 2016-05-17: qty 1

## 2016-05-17 MED ORDER — SODIUM CHLORIDE 0.9 % IV SOLN
90.0000 mg/m2 | Freq: Once | INTRAVENOUS | Status: AC
  Administered 2016-05-17: 150 mg via INTRAVENOUS
  Filled 2016-05-17: qty 6

## 2016-05-17 MED ORDER — SODIUM CHLORIDE 0.9 % IV SOLN
Freq: Once | INTRAVENOUS | Status: AC
  Administered 2016-05-17: 09:00:00 via INTRAVENOUS
  Filled 2016-05-17: qty 1000

## 2016-05-17 MED ORDER — SODIUM CHLORIDE 0.9% FLUSH
10.0000 mL | INTRAVENOUS | Status: DC | PRN
  Administered 2016-05-17: 10 mL
  Filled 2016-05-17: qty 10

## 2016-05-17 MED ORDER — SODIUM CHLORIDE 0.9 % IV SOLN: 10.0000 mg | Freq: Once | INTRAVENOUS | Status: DC

## 2016-05-17 MED ORDER — HEPARIN SOD (PORK) LOCK FLUSH 100 UNIT/ML IV SOLN
500.0000 [IU] | Freq: Once | INTRAVENOUS | Status: AC | PRN
  Administered 2016-05-17: 500 [IU]
  Filled 2016-05-17: qty 5

## 2016-05-30 ENCOUNTER — Other Ambulatory Visit: Payer: Self-pay | Admitting: Family Medicine

## 2016-05-30 DIAGNOSIS — I10 Essential (primary) hypertension: Secondary | ICD-10-CM

## 2016-05-31 ENCOUNTER — Telehealth: Payer: Self-pay | Admitting: Family Medicine

## 2016-05-31 NOTE — Telephone Encounter (Signed)
Pt called stated he needs a refill on Amlodipine. Pharm is [facility] in [place]. Thanks.

## 2016-05-31 NOTE — Telephone Encounter (Signed)
Pharmacy is getting patient ready.

## 2016-06-03 ENCOUNTER — Ambulatory Visit (INDEPENDENT_AMBULATORY_CARE_PROVIDER_SITE_OTHER): Payer: Managed Care, Other (non HMO) | Admitting: Family Medicine

## 2016-06-03 ENCOUNTER — Encounter: Payer: Self-pay | Admitting: Family Medicine

## 2016-06-03 DIAGNOSIS — R42 Dizziness and giddiness: Secondary | ICD-10-CM

## 2016-06-03 DIAGNOSIS — I10 Essential (primary) hypertension: Secondary | ICD-10-CM | POA: Diagnosis not present

## 2016-06-03 MED ORDER — HYDROCHLOROTHIAZIDE 25 MG PO TABS: 25.0000 mg | ORAL_TABLET | Freq: Every day | ORAL | 2 refills | Status: DC

## 2016-06-03 NOTE — Assessment & Plan Note (Signed)
Discussed vertigo and possibility being caused by medicines for CLL we will investigate with oncology. Don't feel vertigo is coming from any of his medications for blood pressure.

## 2016-06-03 NOTE — Progress Notes (Signed)
BP 130/77   Pulse 93   Temp 98.1 F (36.7 C)   Wt 128 lb (58.1 kg)   SpO2 99%   BMI 18.50 kg/m    Subjective:    Patient ID: Julian Medina, male    DOB: 1945/09/12, 70 y.o.   MRN: [id number]  HPI: Julian Medina is a 70 y.o. male  Chief Complaint  Patient presents with  . Hypertension    follow up, having some dizzy spells occasionally.  Blood pressure doing well no complaints from medications. Ears resolved and doing well no further drainage and no issues. Patient really bothered by dizziness. This been ongoing a long time. Reviewed patient's medications from the cancer center and both have side effects of dizziness.  Relevant past medical, surgical, family and social history reviewed and updated as indicated. Interim medical history since our last visit reviewed. Allergies and medications reviewed and updated.  Review of Systems  Constitutional: Negative.   Respiratory: Negative.   Cardiovascular: Negative.     Per HPI unless specifically indicated above     Objective:    BP 130/77   Pulse 93   Temp 98.1 F (36.7 C)   Wt 128 lb (58.1 kg)   SpO2 99%   BMI 18.50 kg/m   Wt Readings from Last 3 Encounters:  06/03/16 128 lb (58.1 kg)  05/16/16 127 lb 8.6 oz (57.9 kg)  05/06/16 128 lb (58.1 kg)    Physical Exam  Constitutional: He is oriented to person, place, and time. He appears well-developed and well-nourished. No distress.  HENT:  Head: Normocephalic and atraumatic.  Right Ear: Hearing and external ear normal.  Left Ear: Hearing and external ear normal.  Nose: Nose normal.  Mouth/Throat: Oropharynx is clear and moist.  Eyes: Conjunctivae and lids are normal. Right eye exhibits no discharge. Left eye exhibits no discharge. No scleral icterus.  Cardiovascular: Normal rate, regular rhythm and normal heart sounds.   Pulmonary/Chest: Effort normal and breath sounds normal. No respiratory distress.  Musculoskeletal: Normal range of motion.  Neurological: He  is alert and oriented to person, place, and time.  Skin: Skin is intact. No rash noted.  Psychiatric: He has a normal mood and affect. His speech is normal and behavior is normal. Judgment and thought content normal. Cognition and memory are normal.    Results for orders placed or performed in visit on 05/16/16  CBC with Differential/Platelet  Result Value Ref Range   WBC 6.7 3.8 - 10.6 K/uL   RBC 4.56 4.40 - 5.90 MIL/uL   Hemoglobin 14.0 13.0 - 18.0 g/dL   HCT 39.3 (L) 40.0 - 52.0 %   MCV 86.2 80.0 - 100.0 fL   MCH 30.6 26.0 - 34.0 pg   MCHC 35.5 32.0 - 36.0 g/dL   RDW 14.3 11.5 - 14.5 %   Platelets 174 150 - 440 K/uL   Neutrophils Relative % 54 %   Lymphocytes Relative 19 %   Monocytes Relative 14 %   Eosinophils Relative 10 %   Basophils Relative 3 %   Band Neutrophils 0 %   Metamyelocytes Relative 0 %   Myelocytes 0 %   Promyelocytes Absolute 0 %   Blasts 0 %   nRBC 0 0 /100 WBC   Other 0 %   Neutro Abs 3.6 1.4 - 6.5 K/uL   Lymphs Abs 1.3 1.0 - 3.6 K/uL   Monocytes Absolute 0.9 0.2 - 1.0 K/uL   Eosinophils Absolute 0.7 0 - 0.7  K/uL   Basophils Absolute 0.2 (H) 0 - 0.1 K/uL   Smear Review MORPHOLOGY UNREMARKABLE   Comprehensive metabolic panel  Result Value Ref Range   Sodium 133 (L) 135 - 145 mmol/L   Potassium 4.4 3.5 - 5.1 mmol/L   Chloride 102 101 - 111 mmol/L   CO2 25 22 - 32 mmol/L   Glucose, Bld 85 65 - 99 mg/dL   BUN 21 (H) 6 - 20 mg/dL   Creatinine, Ser 1.53 (H) 0.61 - 1.24 mg/dL   Calcium 9.6 8.9 - 10.3 mg/dL   Total Protein 7.3 6.5 - 8.1 g/dL   Albumin 4.6 3.5 - 5.0 g/dL   AST 20 15 - 41 U/L   ALT 12 (L) 17 - 63 U/L   Alkaline Phosphatase 61 38 - 126 U/L   Total Bilirubin 0.3 0.3 - 1.2 mg/dL   GFR calc non Af Amer 45 (L) >60 mL/min   GFR calc Af Amer 52 (L) >60 mL/min   Anion gap 6 5 - 15      Assessment & Plan:   Problem List Items Addressed This Visit      Cardiovascular and Mediastinum   Essential hypertension    The current medical  regimen is effective;  continue present plan and medications.       Relevant Medications   hydrochlorothiazide (HYDRODIURIL) 25 MG tablet   Other Relevant Orders   Basic metabolic panel     Other   Vertigo    Discussed vertigo and possibility being caused by medicines for CLL we will investigate with oncology. Don't feel vertigo is coming from any of his medications for blood pressure.      Relevant Orders   Basic metabolic panel       Follow up plan: Return in about 6 months (around 12/01/2016) for BMP.

## 2016-06-03 NOTE — Assessment & Plan Note (Signed)
The current medical regimen is effective;  continue present plan and medications.  

## 2016-06-12 ENCOUNTER — Ambulatory Visit
Admission: RE | Admit: 2016-06-12 | Discharge: 2016-06-12 | Disposition: A | Discharge status: Home / Self Care | Payer: Managed Care, Other (non HMO) | Source: Ambulatory Visit | Attending: Urology | Admitting: Urology

## 2016-06-12 ENCOUNTER — Ambulatory Visit: Payer: Managed Care, Other (non HMO)

## 2016-06-12 DIAGNOSIS — R918 Other nonspecific abnormal finding of lung field: Secondary | ICD-10-CM | POA: Diagnosis not present

## 2016-06-12 DIAGNOSIS — C642 Malignant neoplasm of left kidney, except renal pelvis: Secondary | ICD-10-CM | POA: Diagnosis not present

## 2016-06-12 DIAGNOSIS — Z905 Acquired absence of kidney: Secondary | ICD-10-CM | POA: Insufficient documentation

## 2016-06-12 DIAGNOSIS — N289 Disorder of kidney and ureter, unspecified: Secondary | ICD-10-CM | POA: Diagnosis not present

## 2016-06-12 DIAGNOSIS — R590 Localized enlarged lymph nodes: Secondary | ICD-10-CM | POA: Diagnosis not present

## 2016-06-12 LAB — POCT I-STAT CREATININE: Creatinine, Ser: 1.8 mg/dL — ABNORMAL HIGH (ref 0.61–1.24)

## 2016-06-12 MED ORDER — IOPAMIDOL (ISOVUE-300) INJECTION 61%
75.0000 mL | Freq: Once | INTRAVENOUS | Status: AC | PRN
  Administered 2016-06-12: 75 mL via INTRAVENOUS

## 2016-06-13 ENCOUNTER — Ambulatory Visit: Payer: Managed Care, Other (non HMO) | Admitting: Oncology

## 2016-06-13 ENCOUNTER — Ambulatory Visit: Payer: Managed Care, Other (non HMO)

## 2016-06-13 ENCOUNTER — Other Ambulatory Visit: Payer: Managed Care, Other (non HMO)

## 2016-06-14 ENCOUNTER — Encounter: Payer: Self-pay | Admitting: Urology

## 2016-06-14 ENCOUNTER — Ambulatory Visit: Payer: Managed Care, Other (non HMO)

## 2016-06-14 ENCOUNTER — Ambulatory Visit (INDEPENDENT_AMBULATORY_CARE_PROVIDER_SITE_OTHER): Payer: Managed Care, Other (non HMO) | Admitting: Urology

## 2016-06-14 VITALS — BP 132/82 | HR 80 | Ht 69.75 in | Wt 128.2 lb

## 2016-06-14 DIAGNOSIS — N183 Chronic kidney disease, stage 3 unspecified: Secondary | ICD-10-CM

## 2016-06-14 DIAGNOSIS — C9110 Chronic lymphocytic leukemia of B-cell type not having achieved remission: Secondary | ICD-10-CM | POA: Diagnosis not present

## 2016-06-14 DIAGNOSIS — Z85528 Personal history of other malignant neoplasm of kidney: Secondary | ICD-10-CM

## 2016-06-14 NOTE — Progress Notes (Signed)
06/14/2016 2:24 PM   Julian Medina 25-Apr-1946 [id number]  Referring provider: Guadalupe Maple, MD [address] [place], [place] [address]  Chief Complaint  Patient presents with  . Follow-up    CT scan results    HPI: 70 yo M with left renal mass s/p left robotic partial nephrectomy on 11/14/2015 who returns today for his 6 months follow up.    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   Fuhrman grade 2. Margins grossly negative at the time of resection, possible iatrogenic positive margin secondary specimen fracture the time of extraction.  He continues to be followed closely followed by Dr. Grayland Ormond for his CLL.  Imaging in August 2017 showed new bulky extensive subclavicular, mediastinal, hilar, porta hepatis, and retroperitoneal along with pelvic lymphadenopathy. He's had an excellent response to this.  He underwent 1 cycle of Rituxan/ Treanda with excellent response recently, plan to have a total of 3 cycles.    CT abdomen pelvis with and without contrast performed on 06/12/2016 shows no evidence of local recurrence within the tumor bed. There are multiple bilateral lesions, too small to characterize Foley. There is also evidence of possible right pyelonephritis although suspect this may be related to recent chemotherapy administration. Improving abdominal and pelvic adenopathy. There is minimal base nodularity of the lung which is known.  He is due for additional radiological imaging in February 2018.  Overall, he feels quite well. He has not been back to golfing but is anxious to do so.   PMH: Past Medical History:  Diagnosis Date  . Anxiety   . CLL (chronic lymphocytic leukemia) ([place])   . COPD (chronic obstructive pulmonary disease) ([place])     Surgical History: Past Surgical History:  Procedure Laterality Date  . HERNIA REPAIR    . PERIPHERAL VASCULAR CATHETERIZATION N/A 03/14/2016   Procedure: Glori Luis  Cath Insertion;  Surgeon: Algernon Huxley, MD;  Location: [facility];  Service: Cardiovascular;  Laterality: N/A;  . ROBOTIC ASSITED PARTIAL NEPHRECTOMY Left 11/14/2015   Procedure: ROBOTIC ASSITED PARTIAL NEPHRECTOMY with intraoperative drop in ultrasound / Extensive Lysis of adhesions;  Surgeon: [name], MD;  Location: [facility];  Service: Urology;  Laterality: Left;    Home Medications:    Medication List       Accurate as of 06/14/16 11:59 PM. Always use your most recent med list.          ADVAIR DISKUS 250-50 MCG/DOSE Aepb Generic drug:  Fluticasone-Salmeterol INHALE 1 PUFF 2 TIMES A DAY   amLODipine 10 MG tablet Commonly known as:  NORVASC Take 1 tablet (10 mg total) by mouth daily.   benazepril 40 MG tablet Commonly known as:  LOTENSIN 1 TABLET ONCE EACH DAY ORAL   hydrochlorothiazide 25 MG tablet Commonly known as:  HYDRODIURIL Take 1 tablet (25 mg total) by mouth daily.   lidocaine-prilocaine cream Commonly known as:  EMLA Apply 1 application topically as needed. Apply 1-2 hours prior to chemotherapy. Cover with plastic wrap.   LORazepam 1 MG tablet Commonly known as:  ATIVAN Take 1 tablet (1 mg total) by mouth daily as needed for anxiety. 1/2 to 1 tab       Allergies:  Allergies  Allergen Reactions  . Aspirin Other (See Comments)    Ulcer     Family History: Family History  Problem Relation Age of Onset  . Hearing loss Brother  Social History:  reports that he has been smoking Cigarettes.  He has a 60.00 pack-year smoking history. He has never used smokeless tobacco. He reports that he drinks about 4.2 oz of alcohol per week . He reports that he does not use drugs.  ROS: UROLOGY Frequent Urination?: Yes Hard to postpone urination?: No Burning/pain with urination?: No Get up at night to urinate?: Yes Leakage of urine?: No Urine stream starts and stops?: No Trouble starting stream?: No Do you have to strain to urinate?: No Blood  in urine?: No Urinary tract infection?: No Sexually transmitted disease?: No Injury to kidneys or bladder?: No Painful intercourse?: No Weak stream?: No Erection problems?: No Penile pain?: No  Gastrointestinal Nausea?: No Vomiting?: No Indigestion/heartburn?: No Diarrhea?: No Constipation?: No  Constitutional Fever: No Night sweats?: Yes Weight loss?: No Fatigue?: Yes  Skin Skin rash/lesions?: No Itching?: No  Eyes Blurred vision?: No Double vision?: No  Ears/Nose/Throat Sore throat?: No Sinus problems?: No  Hematologic/Lymphatic Swollen glands?: No Easy bruising?: Yes  Cardiovascular Leg swelling?: No Chest pain?: No  Respiratory Cough?: No Shortness of breath?: Yes  Endocrine Excessive thirst?: No  Musculoskeletal Back pain?: No Joint pain?: No  Neurological Headaches?: No Dizziness?: Yes  Psychologic Depression?: No Anxiety?: No  Physical Exam: BP 132/82 (BP Location: Left Arm, Patient Position: Sitting, Cuff Size: Normal)   Pulse 80   Ht 5' 9.75" (1.772 m)   Wt 128 lb 3.2 oz (58.2 kg)   BMI 18.53 kg/m   Constitutional:  Alert and oriented, No acute distress.  Presents to the office today with his wife. HEENT: [place] AT, moist mucus membranes.  Trachea midline, no masses. Cardiovascular: No clubbing, cyanosis, or edema. Respiratory: Normal respiratory effort, no increased work of breathing. GI: Abdomen is soft, nontender, nondistended, no abdominal masses.   Skin: No rashes, bruises or suspicious lesions. Neurologic: Grossly intact, no focal deficits, moving all 4 extremities. Psychiatric: Normal mood and affect.  Laboratory Data: Lab Results  Component Value Date   WBC 6.7 05/16/2016   HGB 14.0 05/16/2016   HCT 39.3 (L) 05/16/2016   MCV 86.2 05/16/2016   PLT 174 05/16/2016    Lab Results  Component Value Date   CREATININE 1.80 (H) 06/12/2016    Assessment & Plan:    1. 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 CKD (chronic kidney disease), unspecified stage Most recent Cr 1.5, baseline 1.3   Return in about 1 year (around 06/14/2017) for f/u CT abd/ pelvis with and wtihout contrast.  [name], MD  [facility] [address], [place], [place] [address] [phone]

## 2016-06-27 ENCOUNTER — Inpatient Hospital Stay: Payer: Managed Care, Other (non HMO) | Attending: Oncology

## 2016-06-27 ENCOUNTER — Other Ambulatory Visit: Payer: Self-pay | Admitting: Family Medicine

## 2016-06-27 DIAGNOSIS — I10 Essential (primary) hypertension: Secondary | ICD-10-CM

## 2016-06-27 DIAGNOSIS — C9110 Chronic lymphocytic leukemia of B-cell type not having achieved remission: Secondary | ICD-10-CM

## 2016-06-27 DIAGNOSIS — Z95828 Presence of other vascular implants and grafts: Secondary | ICD-10-CM

## 2016-06-27 LAB — COMPREHENSIVE METABOLIC PANEL
ALBUMIN: 4.1 g/dL (ref 3.5–5.0)
ALT: 12 U/L — AB (ref 17–63)
AST: 20 U/L (ref 15–41)
Alkaline Phosphatase: 55 U/L (ref 38–126)
Anion gap: 7 (ref 5–15)
BUN: 18 mg/dL (ref 6–20)
CHLORIDE: 102 mmol/L (ref 101–111)
CO2: 25 mmol/L (ref 22–32)
CREATININE: 1.43 mg/dL — AB (ref 0.61–1.24)
Calcium: 9.4 mg/dL (ref 8.9–10.3)
GFR calc non Af Amer: 48 mL/min — ABNORMAL LOW (ref >60)
GFR, EST AFRICAN AMERICAN: 56 mL/min — AB (ref >60)
GLUCOSE: 79 mg/dL (ref 65–99)
Potassium: 4.1 mmol/L (ref 3.5–5.1)
SODIUM: 134 mmol/L — AB (ref 135–145)
Total Bilirubin: 0.5 mg/dL (ref 0.3–1.2)
Total Protein: 6.9 g/dL (ref 6.5–8.1)

## 2016-06-27 MED ORDER — HEPARIN SOD (PORK) LOCK FLUSH 100 UNIT/ML IV SOLN
500.0000 [IU] | Freq: Once | INTRAVENOUS | Status: AC
  Administered 2016-06-27: 500 [IU] via INTRAVENOUS

## 2016-06-27 MED ORDER — SODIUM CHLORIDE 0.9% FLUSH
10.0000 mL | INTRAVENOUS | Status: DC | PRN
  Administered 2016-06-27: 10 mL via INTRAVENOUS
  Filled 2016-06-27: qty 10

## 2016-07-26 ENCOUNTER — Encounter: Payer: Self-pay | Admitting: Family Medicine

## 2016-07-26 ENCOUNTER — Ambulatory Visit (INDEPENDENT_AMBULATORY_CARE_PROVIDER_SITE_OTHER): Payer: Managed Care, Other (non HMO) | Admitting: Family Medicine

## 2016-07-26 ENCOUNTER — Ambulatory Visit: Payer: Managed Care, Other (non HMO) | Admitting: Family Medicine

## 2016-07-26 VITALS — BP 115/82 | HR 92 | Temp 98.2°F | Wt 126.5 lb

## 2016-07-26 DIAGNOSIS — J441 Chronic obstructive pulmonary disease with (acute) exacerbation: Secondary | ICD-10-CM | POA: Diagnosis not present

## 2016-07-26 DIAGNOSIS — R0989 Other specified symptoms and signs involving the circulatory and respiratory systems: Secondary | ICD-10-CM

## 2016-07-26 LAB — VERITOR FLU A/B WAIVED
INFLUENZA A: NEGATIVE
INFLUENZA B: NEGATIVE

## 2016-07-26 MED ORDER — ALBUTEROL SULFATE HFA 108 (90 BASE) MCG/ACT IN AERS: 2.0000 | INHALATION_SPRAY | Freq: Four times a day (QID) | RESPIRATORY_TRACT | 0 refills | Status: DC | PRN

## 2016-07-26 MED ORDER — AZITHROMYCIN 250 MG PO TABS: ORAL_TABLET | ORAL | 0 refills | Status: DC

## 2016-07-26 MED ORDER — ALBUTEROL SULFATE (2.5 MG/3ML) 0.083% IN NEBU: 2.5000 mg | INHALATION_SOLUTION | Freq: Once | RESPIRATORY_TRACT | Status: DC

## 2016-07-26 NOTE — Progress Notes (Signed)
BP 115/82 (BP Location: Left Arm, Patient Position: Sitting, Cuff Size: Normal)   Pulse 92   Temp 98.2 F (36.8 C)   Wt 126 lb 8 oz (57.4 kg)   SpO2 99%   BMI 18.28 kg/m    Subjective:    Patient ID: Julian Medina, male    DOB: 1946/01/27, 71 y.o.   MRN: [id number]  HPI: Julian Medina is a 71 y.o. male  Chief Complaint  Patient presents with  . Cough   UPPER RESPIRATORY TRACT INFECTION Duration: 2 weeks Worst symptom: snot, cough Fever: no Cough: yes Shortness of breath: yes Wheezing: yes Chest pain: no Chest tightness: yes Chest congestion: yes Nasal congestion: yes Runny nose: yes Post nasal drip: yes Sneezing: yes Sore throat: no Swollen glands: no Sinus pressure: yes Headache: yes Face pain: no Toothache: no Ear pain: no  Ear pressure: no  Eyes red/itching:no Eye drainage/crusting: no  Vomiting: no Rash: no Fatigue: yes Sick contacts: yes Strep contacts: no  Context: worse Recurrent sinusitis: no Relief with OTC cold/cough medications: no  Treatments attempted: cough drops    Relevant past medical, surgical, family and social history reviewed and updated as indicated. Interim medical history since our last visit reviewed. Allergies and medications reviewed and updated.  Review of Systems  Constitutional: Negative.   HENT: Positive for congestion, postnasal drip, rhinorrhea, sinus pain and sinus pressure. Negative for dental problem, drooling, ear discharge, ear pain, facial swelling, hearing loss, mouth sores, nosebleeds, sneezing, sore throat, tinnitus, trouble swallowing and voice change.   Respiratory: Positive for cough, chest tightness, shortness of breath and wheezing. Negative for apnea, choking and stridor.   Cardiovascular: Negative.   Psychiatric/Behavioral: Negative.     Per HPI unless specifically indicated above     Objective:    BP 115/82 (BP Location: Left Arm, Patient Position: Sitting, Cuff Size: Normal)   Pulse 92   Temp  98.2 F (36.8 C)   Wt 126 lb 8 oz (57.4 kg)   SpO2 99%   BMI 18.28 kg/m   Wt Readings from Last 3 Encounters:  07/26/16 126 lb 8 oz (57.4 kg)  06/14/16 128 lb 3.2 oz (58.2 kg)  06/03/16 128 lb (58.1 kg)    Physical Exam  Constitutional: He is oriented to person, place, and time. He appears well-developed and well-nourished. No distress.  HENT:  Head: Normocephalic and atraumatic.  Right Ear: Hearing and external ear normal.  Left Ear: Hearing and external ear normal.  Nose: Nose normal.  Mouth/Throat: Oropharynx is clear and moist. No oropharyngeal exudate.  Eyes: Conjunctivae, EOM and lids are normal. Pupils are equal, round, and reactive to light. Right eye exhibits no discharge. Left eye exhibits no discharge. No scleral icterus.  Neck: Normal range of motion. Neck supple. No JVD present. No tracheal deviation present. No thyromegaly present.  Cardiovascular: Normal rate, regular rhythm, normal heart sounds and intact distal pulses.  Exam reveals no gallop and no friction rub.   No murmur heard. Pulmonary/Chest: Effort normal. No stridor. No respiratory distress. He has wheezes (mild). He has no rales. He exhibits no tenderness.  Musculoskeletal: Normal range of motion.  Lymphadenopathy:    He has no cervical adenopathy.  Neurological: He is alert and oriented to person, place, and time.  Skin: Skin is intact. No rash noted. He is not diaphoretic.  Psychiatric: He has a normal mood and affect. His speech is normal and behavior is normal. Judgment and thought content normal. Cognition and memory  are normal.  Nursing note and vitals reviewed.   Results for orders placed or performed in visit on 06/27/16  Comprehensive metabolic panel  Result Value Ref Range   Sodium 134 (L) 135 - 145 mmol/L   Potassium 4.1 3.5 - 5.1 mmol/L   Chloride 102 101 - 111 mmol/L   CO2 25 22 - 32 mmol/L   Glucose, Bld 79 65 - 99 mg/dL   BUN 18 6 - 20 mg/dL   Creatinine, Ser 1.43 (H) 0.61 - 1.24  mg/dL   Calcium 9.4 8.9 - 10.3 mg/dL   Total Protein 6.9 6.5 - 8.1 g/dL   Albumin 4.1 3.5 - 5.0 g/dL   AST 20 15 - 41 U/L   ALT 12 (L) 17 - 63 U/L   Alkaline Phosphatase 55 38 - 126 U/L   Total Bilirubin 0.5 0.3 - 1.2 mg/dL   GFR calc non Af Amer 48 (L) >60 mL/min   GFR calc Af Amer 56 (L) >60 mL/min   Anion gap 7 5 - 15      Assessment & Plan:   Problem List Items Addressed This Visit    None    Visit Diagnoses    COPD exacerbation ([facility])    -  Primary   Mild today. Will treat with z-pack and albuterol. Call if not getting better. Recheck 2 weeks.    Relevant Medications   albuterol (PROVENTIL) (2.5 MG/3ML) 0.083% nebulizer solution 2.5 mg   Chest congestion       Flu negative, COPD exacerbation.    Relevant Medications   albuterol (PROVENTIL) (2.5 MG/3ML) 0.083% nebulizer solution 2.5 mg   Other Relevant Orders   Veritor Flu A/B Waived       Follow up plan: Return in about 2 weeks (around 08/09/2016) for Lung recheck.

## 2016-07-31 ENCOUNTER — Ambulatory Visit: Payer: Managed Care, Other (non HMO) | Admitting: Family Medicine

## 2016-08-06 ENCOUNTER — Telehealth: Payer: Self-pay | Admitting: Family Medicine

## 2016-08-06 MED ORDER — PREDNISONE 10 MG PO TABS: ORAL_TABLET | ORAL | 0 refills | Status: DC

## 2016-08-06 NOTE — Telephone Encounter (Signed)
Rx sent to his pharmacy

## 2016-08-06 NOTE — Telephone Encounter (Signed)
Per A&P from that [facility] on 07/26/16, " COPD exacerbation ([place])    -  Primary    Mild today. Will treat with z-pack and albuterol. Call if not getting better. Recheck 2 weeks.    "  Pt has a follow up scheduled for 08/08/16. PLease advise.

## 2016-08-06 NOTE — Telephone Encounter (Signed)
Patient was seen by Dr Wynetta Emery on 07/26/2016 for respiratory issues.  Per patient he Dr Wynetta Emery asked him to call back if no better and she would call him in another medication.  He does say he is 50% better but still is not feeling well at all.  Thank Dennis Bast Santiago Glad  [phone]

## 2016-08-06 NOTE — Telephone Encounter (Signed)
Message relayed to patient. Verbalized understanding and denied questions.   

## 2016-08-08 ENCOUNTER — Inpatient Hospital Stay: Payer: Managed Care, Other (non HMO) | Attending: Oncology

## 2016-08-08 DIAGNOSIS — Z95828 Presence of other vascular implants and grafts: Secondary | ICD-10-CM

## 2016-08-08 DIAGNOSIS — C9110 Chronic lymphocytic leukemia of B-cell type not having achieved remission: Secondary | ICD-10-CM | POA: Insufficient documentation

## 2016-08-08 LAB — COMPREHENSIVE METABOLIC PANEL
ALK PHOS: 66 U/L (ref 38–126)
ALT: 10 U/L — ABNORMAL LOW (ref 17–63)
ANION GAP: 8 (ref 5–15)
AST: 16 U/L (ref 15–41)
Albumin: 3.9 g/dL (ref 3.5–5.0)
BILIRUBIN TOTAL: 0.4 mg/dL (ref 0.3–1.2)
BUN: 18 mg/dL (ref 6–20)
CALCIUM: 9.2 mg/dL (ref 8.9–10.3)
CO2: 25 mmol/L (ref 22–32)
Chloride: 103 mmol/L (ref 101–111)
Creatinine, Ser: 1.39 mg/dL — ABNORMAL HIGH (ref 0.61–1.24)
GFR calc non Af Amer: 50 mL/min — ABNORMAL LOW (ref >60)
GFR, EST AFRICAN AMERICAN: 58 mL/min — AB (ref >60)
Glucose, Bld: 92 mg/dL (ref 65–99)
Potassium: 3.8 mmol/L (ref 3.5–5.1)
Sodium: 136 mmol/L (ref 135–145)
TOTAL PROTEIN: 7 g/dL (ref 6.5–8.1)

## 2016-08-08 LAB — CBC WITH DIFFERENTIAL/PLATELET
Basophils Absolute: 0.1 10*3/uL (ref 0–0.1)
Basophils Relative: 1 %
Eosinophils Absolute: 0.1 10*3/uL (ref 0–0.7)
Eosinophils Relative: 1 %
HCT: 26.2 % — ABNORMAL LOW (ref 40.0–52.0)
HEMOGLOBIN: 9 g/dL — AB (ref 13.0–18.0)
LYMPHS ABS: 1.2 10*3/uL (ref 1.0–3.6)
Lymphocytes Relative: 9 %
MCH: 28.9 pg (ref 26.0–34.0)
MCHC: 34.1 g/dL (ref 32.0–36.0)
MCV: 84.8 fL (ref 80.0–100.0)
MONOS PCT: 5 %
Monocytes Absolute: 0.7 10*3/uL (ref 0.2–1.0)
NEUTROS PCT: 84 %
Neutro Abs: 11.9 10*3/uL — ABNORMAL HIGH (ref 1.4–6.5)
Platelets: 385 10*3/uL (ref 150–440)
RBC: 3.1 MIL/uL — ABNORMAL LOW (ref 4.40–5.90)
RDW: 16.6 % — ABNORMAL HIGH (ref 11.5–14.5)
WBC: 14.1 10*3/uL — ABNORMAL HIGH (ref 3.8–10.6)

## 2016-08-08 MED ORDER — HEPARIN SOD (PORK) LOCK FLUSH 100 UNIT/ML IV SOLN
500.0000 [IU] | Freq: Once | INTRAVENOUS | Status: AC
  Administered 2016-08-08: 500 [IU] via INTRAVENOUS

## 2016-08-08 MED ORDER — SODIUM CHLORIDE 0.9% FLUSH
10.0000 mL | INTRAVENOUS | Status: DC | PRN
  Administered 2016-08-08: 10 mL via INTRAVENOUS
  Filled 2016-08-08: qty 10

## 2016-08-15 ENCOUNTER — Ambulatory Visit: Payer: Managed Care, Other (non HMO)

## 2016-08-15 ENCOUNTER — Other Ambulatory Visit: Payer: Managed Care, Other (non HMO)

## 2016-08-19 ENCOUNTER — Ambulatory Visit: Payer: Managed Care, Other (non HMO) | Admitting: Oncology

## 2016-08-28 ENCOUNTER — Other Ambulatory Visit: Payer: Self-pay | Admitting: Family Medicine

## 2016-08-28 MED ORDER — OSELTAMIVIR PHOSPHATE 30 MG PO CAPS: 30.0000 mg | ORAL_CAPSULE | Freq: Two times a day (BID) | ORAL | 0 refills | Status: DC

## 2016-09-19 ENCOUNTER — Inpatient Hospital Stay: Payer: Managed Care, Other (non HMO) | Attending: Oncology

## 2016-09-19 DIAGNOSIS — C9110 Chronic lymphocytic leukemia of B-cell type not having achieved remission: Secondary | ICD-10-CM | POA: Diagnosis present

## 2016-09-19 DIAGNOSIS — Z95828 Presence of other vascular implants and grafts: Secondary | ICD-10-CM

## 2016-09-19 LAB — COMPREHENSIVE METABOLIC PANEL
ALBUMIN: 4.1 g/dL (ref 3.5–5.0)
ALT: 10 U/L — ABNORMAL LOW (ref 17–63)
ANION GAP: 9 (ref 5–15)
AST: 19 U/L (ref 15–41)
Alkaline Phosphatase: 70 U/L (ref 38–126)
BUN: 21 mg/dL — AB (ref 6–20)
CHLORIDE: 104 mmol/L (ref 101–111)
CO2: 23 mmol/L (ref 22–32)
Calcium: 9.3 mg/dL (ref 8.9–10.3)
Creatinine, Ser: 1.65 mg/dL — ABNORMAL HIGH (ref 0.61–1.24)
GFR calc Af Amer: 47 mL/min — ABNORMAL LOW (ref >60)
GFR calc non Af Amer: 41 mL/min — ABNORMAL LOW (ref >60)
GLUCOSE: 122 mg/dL — AB (ref 65–99)
POTASSIUM: 4.2 mmol/L (ref 3.5–5.1)
SODIUM: 136 mmol/L (ref 135–145)
TOTAL PROTEIN: 6.7 g/dL (ref 6.5–8.1)
Total Bilirubin: 0.3 mg/dL (ref 0.3–1.2)

## 2016-09-19 LAB — CBC WITH DIFFERENTIAL/PLATELET
BASOS ABS: 0.5 10*3/uL — AB (ref 0–0.1)
Basophils Relative: 4 %
EOS ABS: 0.5 10*3/uL (ref 0–0.7)
Eosinophils Relative: 4 %
HCT: 32 % — ABNORMAL LOW (ref 40.0–52.0)
Hemoglobin: 10.9 g/dL — ABNORMAL LOW (ref 13.0–18.0)
Lymphocytes Relative: 11 %
Lymphs Abs: 1.3 10*3/uL (ref 1.0–3.6)
MCH: 29.4 pg (ref 26.0–34.0)
MCHC: 33.9 g/dL (ref 32.0–36.0)
MCV: 86.7 fL (ref 80.0–100.0)
Monocytes Absolute: 0.6 10*3/uL (ref 0.2–1.0)
Monocytes Relative: 5 %
NEUTROS ABS: 9.3 10*3/uL — AB (ref 1.4–6.5)
Neutrophils Relative %: 76 %
PLATELETS: 259 10*3/uL (ref 150–440)
RBC: 3.69 MIL/uL — AB (ref 4.40–5.90)
RDW: 16.3 % — AB (ref 11.5–14.5)
WBC: 12.2 10*3/uL — AB (ref 3.8–10.6)

## 2016-09-19 MED ORDER — SODIUM CHLORIDE 0.9% FLUSH
10.0000 mL | INTRAVENOUS | Status: DC | PRN
Start: 2016-09-19 — End: 2016-09-19
  Administered 2016-09-19: 10 mL via INTRAVENOUS
  Filled 2016-09-19: qty 10

## 2016-09-19 MED ORDER — HEPARIN SOD (PORK) LOCK FLUSH 100 UNIT/ML IV SOLN
500.0000 [IU] | Freq: Once | INTRAVENOUS | Status: AC
  Administered 2016-09-19: 500 [IU] via INTRAVENOUS

## 2016-10-22 ENCOUNTER — Other Ambulatory Visit: Payer: Managed Care, Other (non HMO)

## 2016-10-22 ENCOUNTER — Ambulatory Visit: Admission: RE | Admit: 2016-10-22 | Payer: Managed Care, Other (non HMO) | Source: Ambulatory Visit

## 2016-10-22 ENCOUNTER — Inpatient Hospital Stay: Payer: Managed Care, Other (non HMO)

## 2016-10-24 ENCOUNTER — Ambulatory Visit: Admission: RE | Admit: 2016-10-24 | Payer: Managed Care, Other (non HMO) | Source: Ambulatory Visit

## 2016-10-24 ENCOUNTER — Ambulatory Visit: Payer: Managed Care, Other (non HMO) | Admitting: Oncology

## 2016-10-24 ENCOUNTER — Inpatient Hospital Stay: Payer: Managed Care, Other (non HMO)

## 2016-10-29 ENCOUNTER — Ambulatory Visit
Admission: RE | Admit: 2016-10-29 | Discharge: 2016-10-29 | Disposition: A | Discharge status: Home / Self Care | Payer: Managed Care, Other (non HMO) | Source: Ambulatory Visit | Attending: Oncology | Admitting: Oncology

## 2016-10-29 ENCOUNTER — Inpatient Hospital Stay: Payer: Managed Care, Other (non HMO) | Attending: Oncology

## 2016-10-29 DIAGNOSIS — N2889 Other specified disorders of kidney and ureter: Secondary | ICD-10-CM | POA: Diagnosis not present

## 2016-10-29 DIAGNOSIS — R918 Other nonspecific abnormal finding of lung field: Secondary | ICD-10-CM | POA: Insufficient documentation

## 2016-10-29 DIAGNOSIS — J449 Chronic obstructive pulmonary disease, unspecified: Secondary | ICD-10-CM | POA: Diagnosis not present

## 2016-10-29 DIAGNOSIS — F419 Anxiety disorder, unspecified: Secondary | ICD-10-CM | POA: Diagnosis not present

## 2016-10-29 DIAGNOSIS — N329 Bladder disorder, unspecified: Secondary | ICD-10-CM | POA: Diagnosis not present

## 2016-10-29 DIAGNOSIS — I700 Atherosclerosis of aorta: Secondary | ICD-10-CM | POA: Insufficient documentation

## 2016-10-29 DIAGNOSIS — N281 Cyst of kidney, acquired: Secondary | ICD-10-CM | POA: Insufficient documentation

## 2016-10-29 DIAGNOSIS — C9110 Chronic lymphocytic leukemia of B-cell type not having achieved remission: Secondary | ICD-10-CM

## 2016-10-29 DIAGNOSIS — I2510 Atherosclerotic heart disease of native coronary artery without angina pectoris: Secondary | ICD-10-CM | POA: Diagnosis not present

## 2016-10-29 DIAGNOSIS — C9190 Lymphoid leukemia, unspecified not having achieved remission: Secondary | ICD-10-CM | POA: Diagnosis present

## 2016-10-29 DIAGNOSIS — R591 Generalized enlarged lymph nodes: Secondary | ICD-10-CM | POA: Diagnosis not present

## 2016-10-29 DIAGNOSIS — Z79899 Other long term (current) drug therapy: Secondary | ICD-10-CM | POA: Diagnosis not present

## 2016-10-29 DIAGNOSIS — C642 Malignant neoplasm of left kidney, except renal pelvis: Secondary | ICD-10-CM | POA: Diagnosis not present

## 2016-10-29 DIAGNOSIS — F17210 Nicotine dependence, cigarettes, uncomplicated: Secondary | ICD-10-CM | POA: Insufficient documentation

## 2016-10-29 LAB — COMPREHENSIVE METABOLIC PANEL
ALBUMIN: 4.1 g/dL (ref 3.5–5.0)
ALK PHOS: 59 U/L (ref 38–126)
ALT: 13 U/L — ABNORMAL LOW (ref 17–63)
AST: 20 U/L (ref 15–41)
Anion gap: 7 (ref 5–15)
BILIRUBIN TOTAL: 0.4 mg/dL (ref 0.3–1.2)
BUN: 25 mg/dL — AB (ref 6–20)
CALCIUM: 8.8 mg/dL — AB (ref 8.9–10.3)
CO2: 22 mmol/L (ref 22–32)
Chloride: 105 mmol/L (ref 101–111)
Creatinine, Ser: 1.55 mg/dL — ABNORMAL HIGH (ref 0.61–1.24)
GFR calc Af Amer: 51 mL/min — ABNORMAL LOW (ref >60)
GFR calc non Af Amer: 44 mL/min — ABNORMAL LOW (ref >60)
GLUCOSE: 105 mg/dL — AB (ref 65–99)
Potassium: 4 mmol/L (ref 3.5–5.1)
Sodium: 134 mmol/L — ABNORMAL LOW (ref 135–145)
TOTAL PROTEIN: 6.4 g/dL — AB (ref 6.5–8.1)

## 2016-10-29 LAB — CBC WITH DIFFERENTIAL/PLATELET
BAND NEUTROPHILS: 1 %
BASOS ABS: 0.1 10*3/uL (ref 0–0.1)
BASOS PCT: 1 %
Eosinophils Absolute: 0.5 10*3/uL (ref 0–0.7)
Eosinophils Relative: 5 %
HEMATOCRIT: 34.1 % — AB (ref 40.0–52.0)
HEMOGLOBIN: 11.7 g/dL — AB (ref 13.0–18.0)
LYMPHS ABS: 1.4 10*3/uL (ref 1.0–3.6)
LYMPHS PCT: 14 %
MCH: 29.7 pg (ref 26.0–34.0)
MCHC: 34.2 g/dL (ref 32.0–36.0)
MCV: 86.8 fL (ref 80.0–100.0)
MONOS PCT: 6 %
Monocytes Absolute: 0.6 10*3/uL (ref 0.2–1.0)
Myelocytes: 1 %
Neutro Abs: 7.3 10*3/uL — ABNORMAL HIGH (ref 1.4–6.5)
Neutrophils Relative %: 72 %
Platelets: 207 10*3/uL (ref 150–440)
RBC: 3.92 MIL/uL — ABNORMAL LOW (ref 4.40–5.90)
RDW: 16.3 % — ABNORMAL HIGH (ref 11.5–14.5)
SMEAR REVIEW: ADEQUATE
WBC: 9.9 10*3/uL (ref 3.8–10.6)

## 2016-10-29 MED ORDER — HEPARIN SOD (PORK) LOCK FLUSH 100 UNIT/ML IV SOLN
INTRAVENOUS | Status: AC
  Filled 2016-10-29: qty 5

## 2016-10-29 MED ORDER — IOPAMIDOL (ISOVUE-300) INJECTION 61%
100.0000 mL | Freq: Once | INTRAVENOUS | Status: AC | PRN
  Administered 2016-10-29: 100 mL via INTRAVENOUS

## 2016-10-31 ENCOUNTER — Inpatient Hospital Stay: Payer: Managed Care, Other (non HMO) | Attending: Family Medicine

## 2016-11-06 NOTE — Progress Notes (Signed)
Julian Medina  [phone]) [phone] [phone]  ID: Julian Medina OB: 08-31-1945  MR#: [id number]  YFV#:[id number]  Patient Care Team: Julian Maple, MD as PCP - General (Family Medicine) Julian Huger, MD as Consulting Physician (Oncology) Julian Espy, MD as Consulting Physician (Urology)  CHIEF COMPLAINT: CLL  INTERVAL HISTORY: Patient returns to clinic today for further evaluation and discussion of his CT scan results. He currently feels well and is asymptomatic. He has no new lymphadenopathy. He continues to be highly anxious. He denies any recent fevers, night sweats, or weight loss. He has no neurologic complaints. He denies any pain. He has no chest pain or shortness of breath. He denies any nausea, vomiting, constipation, or diarrhea. He has no urinary complaints. Patient offers no specific complaints today.   REVIEW OF SYSTEMS:   Review of Systems  Constitutional: Negative for diaphoresis, fever, malaise/fatigue and weight loss.  Respiratory: Negative.  Negative for cough and shortness of breath.   Cardiovascular: Negative.  Negative for chest pain.  Gastrointestinal: Negative.  Negative for abdominal pain.  Genitourinary: Negative.   Musculoskeletal: Negative.   Neurological: Negative.  Negative for weakness.  Psychiatric/Behavioral: The patient is nervous/anxious.     As per HPI. Otherwise, a complete review of systems is negative.  PAST MEDICAL HISTORY: Past Medical History:  Diagnosis Date  . Anxiety   . CLL (chronic lymphocytic leukemia) ([place])   . COPD (chronic obstructive pulmonary disease) ([place])     PAST SURGICAL HISTORY: Past Surgical History:  Procedure Laterality Date  . HERNIA REPAIR    . PERIPHERAL VASCULAR CATHETERIZATION N/A 03/14/2016   Procedure: Julian Medina;  Surgeon: Julian Huxley, MD;  Location: [facility];  Service: Cardiovascular;  Laterality: N/A;  . ROBOTIC ASSITED PARTIAL NEPHRECTOMY Left  11/14/2015   Procedure: ROBOTIC ASSITED PARTIAL NEPHRECTOMY with intraoperative drop in ultrasound / Extensive Lysis of adhesions;  Surgeon: Julian Espy, MD;  Location: [facility];  Service: Urology;  Laterality: Left;    FAMILY HISTORY: Heart disease and hypertension.     ADVANCED DIRECTIVES:    HEALTH MAINTENANCE: Social History  Substance Use Topics  . Smoking status: Current Every Day Smoker    Packs/day: 1.50    Years: 40.00    Types: Cigarettes  . Smokeless tobacco: Never Used  . Alcohol use 4.2 oz/week    7 Cans of beer per week     Comment: occasional     Colonoscopy:  PAP:  Bone density:  Lipid panel:  Allergies  Allergen Reactions  . Aspirin Other (See Comments)    Ulcer     Current Outpatient Prescriptions  Medication Sig Dispense Refill  . ADVAIR DISKUS 250-50 MCG/DOSE AEPB INHALE 1 PUFF 2 TIMES A DAY 180 each 3  . albuterol (PROVENTIL HFA;VENTOLIN HFA) 108 (90 Base) MCG/ACT inhaler Inhale 2 puffs into the lungs every 6 (six) hours as needed for wheezing or shortness of breath. 1 Inhaler 0  . amLODipine (NORVASC) 10 MG tablet Take 1 tablet (10 mg total) by mouth daily. 90 tablet 4  . benazepril (LOTENSIN) 40 MG tablet 1 TABLET ONCE EACH DAY ORAL 90 tablet 4  . hydrochlorothiazide (HYDRODIURIL) 25 MG tablet Take 1 tablet (25 mg total) by mouth daily. 90 tablet 2  . lidocaine-prilocaine (EMLA) cream Apply 1 application topically as needed. Apply 1-2 hours prior to chemotherapy. Cover with plastic wrap. 30 g 2  . LORazepam (ATIVAN) 1 MG tablet Take 1 tablet (1 mg total) by  mouth daily as needed for anxiety. 1/2 to 1 tab (Patient not taking: Reported on 11/07/2016) 30 tablet 1   Current Facility-Administered Medications  Medication Dose Route Frequency Provider Last Rate Last Dose  . albuterol (PROVENTIL) (2.5 MG/3ML) 0.083% nebulizer solution 2.5 mg  2.5 mg Nebulization Once [employer], DO        OBJECTIVE: Vitals:   11/07/16 1523  BP: 138/82  Pulse:  76  Resp: 16  Temp: 99.1 F (37.3 C)     Body mass index is 18.86 kg/m.    ECOG FS:0 - Asymptomatic  General: Well-developed, well-nourished, no acute distress. Eyes: Pink conjunctiva, anicteric sclera. Lungs: Clear to auscultation bilaterally. Heart: Regular rate and rhythm. No rubs, murmurs, or gallops. Abdomen: Soft, nontender, nondistended. No organomegaly noted, normoactive bowel sounds. Musculoskeletal: No edema, cyanosis, or clubbing. Neuro: Alert, answering all questions appropriately. Cranial nerves grossly intact. Skin: No rashes or petechiae noted. Psych: Normal affect. Lymphatics: No palpable axillary or cervical lymphadenopathy.  LAB RESULTS:  Lab Results  Component Value Date   NA 134 (L) 10/29/2016   K 4.0 10/29/2016   CL 105 10/29/2016   CO2 22 10/29/2016   GLUCOSE 105 (H) 10/29/2016   BUN 25 (H) 10/29/2016   CREATININE 1.55 (H) 10/29/2016   CALCIUM 8.8 (L) 10/29/2016   PROT 6.4 (L) 10/29/2016   ALBUMIN 4.1 10/29/2016   AST 20 10/29/2016   ALT 13 (L) 10/29/2016   ALKPHOS 59 10/29/2016   BILITOT 0.4 10/29/2016   GFRNONAA 44 (L) 10/29/2016   GFRAA 51 (L) 10/29/2016    Lab Results  Component Value Date   WBC 9.9 10/29/2016   NEUTROABS 7.3 (H) 10/29/2016   HGB 11.7 (L) 10/29/2016   HCT 34.1 (L) 10/29/2016   MCV 86.8 10/29/2016   PLT 207 10/29/2016     STUDIES: Ct Chest W Contrast  Result Date: 10/29/2016 CLINICAL DATA:  Followup chronic lymphocytic leukemia.  Restaging. EXAM: CT CHEST, ABDOMEN, AND PELVIS WITH CONTRAST TECHNIQUE: Multidetector CT imaging of the chest, abdomen and pelvis was performed following the standard protocol during bolus administration of intravenous contrast. CONTRAST:  169mL ISOVUE-300 IOPAMIDOL (ISOVUE-300) INJECTION 61% COMPARISON:  03/05/2016 FINDINGS: CT CHEST FINDINGS Cardiovascular: No acute findings. Aortic and coronary artery atherosclerosis. Mediastinum/Lymph Nodes: Previously seen bulky bilateral axillary and  subpectoral lymphadenopathy is significantly decreased since previous study. Index lymph node in the left axilla currently measures 1.6 cm on image 10/ 2 compared to 2.9 cm previously. Mediastinal and bilateral hilar lymphadenopathy is also decreased since previous study. Index lymph node in the right hilum currently measures 10 mm on image 31/ 2 compared to 20 mm previously . Decrease in mild bilateral supraclavicular lymphadenopathy also noted. No new or increased sites of lymphadenopathy seen within the thorax. Lungs/Pleura: Mild pulmonary emphysema again noted. 5 mm pulmonary nodule in the anterior left upper lobe on image 38/4 is new since previous study. Other scattered bilateral sub-cm pulmonary nodules remain stable. No evidence of pulmonary infiltrate or pleural effusion. Secretions again seen within the right mainstem bronchus ; aspiration cannot be excluded. Musculoskeletal:  No suspicious bone lesions identified. CT ABDOMEN AND PELVIS FINDINGS Hepatobiliary: No masses identified. Gallbladder is unremarkable. Pancreas:  No mass or inflammatory changes. Spleen:  Within normal limits in size and appearance. Adrenals/Urinary tract: No masses or hydronephrosis. Stable tiny bilateral renal cysts remain stable. Stable diffuse bladder wall thickening. Stomach/Bowel: Surgical clips in region of GE junction. No evidence of obstruction, inflammatory process, or abnormal fluid collections. Vascular/Lymphatic: Decreased  abdominal lymphadenopathy seen, with index lymph node in the porta hepatis measuring 1.7 cm compared to 3.5 cm previously. Another index lymph node in the left paraaortic region measures 10 mm compared to 20 mm previously. Decreased pelvic lymphadenopathy is also seen throughout the iliac chains bilaterally. Index lymph node in the left external iliac chain measures 3.4 cm on image 105/2 compared to 4.8 cm previously. Index lymph node in the left external iliac chain measures 1.7 cm on image 107/2,  compared to 3.2 cm previously. No new or increased sites of lymphadenopathy identified. Aortic atherosclerosis.  No evidence of abdominal aortic aneurysm. Reproductive:  No mass or other significant abnormality identified. Other:  None. Musculoskeletal:  No suspicious bone lesions identified. IMPRESSION: Significant decrease in lymphadenopathy throughout the chest, abdomen, and pelvis. No new or increased sites of lymphadenopathy identified. New 5 mm indeterminate pulmonary nodule in left upper lobe. Recommend continued attention on follow-up CT. Secretions again seen in right mainstem bronchus; aspiration cannot be excluded. Stable diffuse wall thickening of urinary bladder. Aortic and coronary artery atherosclerosis. Electronically Signed   By: Earle Gell M.D.   On: 10/29/2016 16:01   Ct Abdomen Pelvis W Contrast  Result Date: 10/29/2016 CLINICAL DATA:  Followup chronic lymphocytic leukemia.  Restaging. EXAM: CT CHEST, ABDOMEN, AND PELVIS WITH CONTRAST TECHNIQUE: Multidetector CT imaging of the chest, abdomen and pelvis was performed following the standard protocol during bolus administration of intravenous contrast. CONTRAST:  140mL ISOVUE-300 IOPAMIDOL (ISOVUE-300) INJECTION 61% COMPARISON:  03/05/2016 FINDINGS: CT CHEST FINDINGS Cardiovascular: No acute findings. Aortic and coronary artery atherosclerosis. Mediastinum/Lymph Nodes: Previously seen bulky bilateral axillary and subpectoral lymphadenopathy is significantly decreased since previous study. Index lymph node in the left axilla currently measures 1.6 cm on image 10/ 2 compared to 2.9 cm previously. Mediastinal and bilateral hilar lymphadenopathy is also decreased since previous study. Index lymph node in the right hilum currently measures 10 mm on image 31/ 2 compared to 20 mm previously . Decrease in mild bilateral supraclavicular lymphadenopathy also noted. No new or increased sites of lymphadenopathy seen within the thorax. Lungs/Pleura: Mild  pulmonary emphysema again noted. 5 mm pulmonary nodule in the anterior left upper lobe on image 38/4 is new since previous study. Other scattered bilateral sub-cm pulmonary nodules remain stable. No evidence of pulmonary infiltrate or pleural effusion. Secretions again seen within the right mainstem bronchus ; aspiration cannot be excluded. Musculoskeletal:  No suspicious bone lesions identified. CT ABDOMEN AND PELVIS FINDINGS Hepatobiliary: No masses identified. Gallbladder is unremarkable. Pancreas:  No mass or inflammatory changes. Spleen:  Within normal limits in size and appearance. Adrenals/Urinary tract: No masses or hydronephrosis. Stable tiny bilateral renal cysts remain stable. Stable diffuse bladder wall thickening. Stomach/Bowel: Surgical clips in region of GE junction. No evidence of obstruction, inflammatory process, or abnormal fluid collections. Vascular/Lymphatic: Decreased abdominal lymphadenopathy seen, with index lymph node in the porta hepatis measuring 1.7 cm compared to 3.5 cm previously. Another index lymph node in the left paraaortic region measures 10 mm compared to 20 mm previously. Decreased pelvic lymphadenopathy is also seen throughout the iliac chains bilaterally. Index lymph node in the left external iliac chain measures 3.4 cm on image 105/2 compared to 4.8 cm previously. Index lymph node in the left external iliac chain measures 1.7 cm on image 107/2, compared to 3.2 cm previously. No new or increased sites of lymphadenopathy identified. Aortic atherosclerosis.  No evidence of abdominal aortic aneurysm. Reproductive:  No mass or other significant abnormality identified. Other:  None.  Musculoskeletal:  No suspicious bone lesions identified. IMPRESSION: Significant decrease in lymphadenopathy throughout the chest, abdomen, and pelvis. No new or increased sites of lymphadenopathy identified. New 5 mm indeterminate pulmonary nodule in left upper lobe. Recommend continued attention on  follow-up CT. Secretions again seen in right mainstem bronchus; aspiration cannot be excluded. Stable diffuse wall thickening of urinary bladder. Aortic and coronary artery atherosclerosis. Electronically Signed   By: Earle Gell M.D.   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   PLAN:    1.  CLL: Flow cytometry confirmed the diagnosis.  Patient is also ZAP-70 positive this which indicates it may be a more aggressive form of CLL. CT results reviewed independently and reported as above with no evidence of recurrent or progressive disease. His white blood cell count is also within normal limits. He most recently received treatment with Rituxan and Treanda on November 2&3, 2017.  No further imaging is necessary unless there is concern for progression of disease. Return to clinic in 3 months for repeat laboratory work and further evaluation. 2.  Anxiety: Continue Xanax PRN. 3.  Renal cell carcinoma: Patient is status post partial nephrectomy on Nov 14, 2015. Continued follow-up per urology. CT results as above. 4.  Renal insufficiency: Patient's creatinine slightly elevated today, monitor.   Patient expressed understanding and was in agreement with this plan. He also understands that He can call clinic at any time with any questions, concerns, or complaints.    Julian Huger, MD   11/09/2016 10:53 PM

## 2016-11-07 ENCOUNTER — Inpatient Hospital Stay (HOSPITAL_BASED_OUTPATIENT_CLINIC_OR_DEPARTMENT_OTHER): Payer: Managed Care, Other (non HMO) | Admitting: Oncology

## 2016-11-07 VITALS — BP 138/82 | HR 76 | Temp 99.1°F | Resp 16 | Wt 130.5 lb

## 2016-11-07 DIAGNOSIS — N281 Cyst of kidney, acquired: Secondary | ICD-10-CM | POA: Diagnosis not present

## 2016-11-07 DIAGNOSIS — C9110 Chronic lymphocytic leukemia of B-cell type not having achieved remission: Secondary | ICD-10-CM

## 2016-11-07 DIAGNOSIS — N2889 Other specified disorders of kidney and ureter: Secondary | ICD-10-CM | POA: Diagnosis not present

## 2016-11-07 DIAGNOSIS — F17210 Nicotine dependence, cigarettes, uncomplicated: Secondary | ICD-10-CM

## 2016-11-07 DIAGNOSIS — J449 Chronic obstructive pulmonary disease, unspecified: Secondary | ICD-10-CM | POA: Diagnosis not present

## 2016-11-07 DIAGNOSIS — F419 Anxiety disorder, unspecified: Secondary | ICD-10-CM

## 2016-11-07 DIAGNOSIS — Z79899 Other long term (current) drug therapy: Secondary | ICD-10-CM

## 2016-11-07 DIAGNOSIS — C642 Malignant neoplasm of left kidney, except renal pelvis: Secondary | ICD-10-CM

## 2016-11-07 DIAGNOSIS — I700 Atherosclerosis of aorta: Secondary | ICD-10-CM

## 2016-11-07 NOTE — Progress Notes (Signed)
Patient here today for follow up.  Patient would like refill on Ativan

## 2016-11-13 ENCOUNTER — Other Ambulatory Visit: Payer: Self-pay | Admitting: *Deleted

## 2016-11-13 MED ORDER — LORAZEPAM 1 MG PO TABS: 1.0000 mg | ORAL_TABLET | Freq: Every day | ORAL | 0 refills | Status: AC | PRN

## 2016-12-02 ENCOUNTER — Ambulatory Visit: Payer: Managed Care, Other (non HMO) | Admitting: Family Medicine

## 2016-12-12 ENCOUNTER — Inpatient Hospital Stay: Payer: Managed Care, Other (non HMO) | Attending: Oncology

## 2016-12-12 DIAGNOSIS — Z452 Encounter for adjustment and management of vascular access device: Secondary | ICD-10-CM | POA: Diagnosis not present

## 2016-12-12 DIAGNOSIS — C9110 Chronic lymphocytic leukemia of B-cell type not having achieved remission: Secondary | ICD-10-CM | POA: Diagnosis present

## 2016-12-12 DIAGNOSIS — Z95828 Presence of other vascular implants and grafts: Secondary | ICD-10-CM

## 2016-12-12 MED ORDER — SODIUM CHLORIDE 0.9% FLUSH
10.0000 mL | INTRAVENOUS | Status: DC | PRN
  Administered 2016-12-12: 10 mL via INTRAVENOUS
  Filled 2016-12-12: qty 10

## 2016-12-12 MED ORDER — HEPARIN SOD (PORK) LOCK FLUSH 100 UNIT/ML IV SOLN
500.0000 [IU] | Freq: Once | INTRAVENOUS | Status: AC
  Administered 2016-12-12: 500 [IU] via INTRAVENOUS

## 2016-12-18 ENCOUNTER — Encounter: Payer: Self-pay | Admitting: Family Medicine

## 2016-12-18 ENCOUNTER — Ambulatory Visit (INDEPENDENT_AMBULATORY_CARE_PROVIDER_SITE_OTHER): Payer: Managed Care, Other (non HMO) | Admitting: Family Medicine

## 2016-12-18 VITALS — BP 126/73 | HR 88 | Wt 129.8 lb

## 2016-12-18 DIAGNOSIS — I10 Essential (primary) hypertension: Secondary | ICD-10-CM | POA: Diagnosis not present

## 2016-12-18 DIAGNOSIS — C9110 Chronic lymphocytic leukemia of B-cell type not having achieved remission: Secondary | ICD-10-CM

## 2016-12-18 DIAGNOSIS — D692 Other nonthrombocytopenic purpura: Secondary | ICD-10-CM

## 2016-12-18 DIAGNOSIS — C9190 Lymphoid leukemia, unspecified not having achieved remission: Secondary | ICD-10-CM | POA: Diagnosis not present

## 2016-12-18 MED ORDER — METOPROLOL SUCCINATE ER 25 MG PO TB24: 25.0000 mg | ORAL_TABLET | Freq: Every day | ORAL | 3 refills | Status: DC

## 2016-12-18 NOTE — Progress Notes (Signed)
BP 126/73   Pulse 88   Wt 129 lb 12.8 oz (58.9 kg)   SpO2 97%   BMI 18.76 kg/m    Subjective:    Patient ID: Julian Medina, male    DOB: August 25, 1945, 71 y.o.   MRN: [id number]  HPI: Julian Medina is a 71 y.o. male  Chief Complaint  Patient presents with  . Follow-up  . Hypertension    HCTz causing itching  Patient follow-up blood pressure has developed some itching and burning discomfort that feels is coming from hydrochlorothiazide. Has tried several days off hydrochlorothiazide and was able to sleep all night and felt better. Did not monitor blood pressure during this time but felt that it went up. Breathing is doing okay along with anxiety. Dr. Grayland Ormond has given him some lorazepam which is doing okay  Relevant past medical, surgical, family and social history reviewed and updated as indicated. Interim medical history since our last visit reviewed. Allergies and medications reviewed and updated.  Review of Systems  Constitutional: Negative.   Respiratory: Negative.   Cardiovascular: Negative.     Per HPI unless specifically indicated above     Objective:    BP 126/73   Pulse 88   Wt 129 lb 12.8 oz (58.9 kg)   SpO2 97%   BMI 18.76 kg/m   Wt Readings from Last 3 Encounters:  12/18/16 129 lb 12.8 oz (58.9 kg)  11/07/16 130 lb 8 oz (59.2 kg)  07/26/16 126 lb 8 oz (57.4 kg)    Physical Exam  Constitutional: He is oriented to person, place, and time. He appears well-developed and well-nourished.  HENT:  Head: Normocephalic and atraumatic.  Eyes: Conjunctivae and EOM are normal.  Neck: Normal range of motion.  Cardiovascular: Normal rate, regular rhythm and normal heart sounds.   Pulmonary/Chest: Effort normal and breath sounds normal.  Musculoskeletal: Normal range of motion.  Neurological: He is alert and oriented to person, place, and time.  Skin: No erythema.  Psychiatric: He has a normal mood and affect. His behavior is normal. Judgment and thought content  normal.    Results for orders placed or performed in visit on 10/29/16  CBC with Differential/Platelet  Result Value Ref Range   WBC 9.9 3.8 - 10.6 K/uL   RBC 3.92 (L) 4.40 - 5.90 MIL/uL   Hemoglobin 11.7 (L) 13.0 - 18.0 g/dL   HCT 34.1 (L) 40.0 - 52.0 %   MCV 86.8 80.0 - 100.0 fL   MCH 29.7 26.0 - 34.0 pg   MCHC 34.2 32.0 - 36.0 g/dL   RDW 16.3 (H) 11.5 - 14.5 %   Platelets 207 150 - 440 K/uL   Neutrophils Relative % 72 %   Lymphocytes Relative 14 %   Monocytes Relative 6 %   Eosinophils Relative 5 %   Basophils Relative 1 %   Band Neutrophils 1 %   Myelocytes 1 %   Neutro Abs 7.3 (H) 1.4 - 6.5 K/uL   Lymphs Abs 1.4 1.0 - 3.6 K/uL   Monocytes Absolute 0.6 0.2 - 1.0 K/uL   Eosinophils Absolute 0.5 0 - 0.7 K/uL   Basophils Absolute 0.1 0 - 0.1 K/uL   RBC Morphology MIXED RBC POPULATION    Smear Review PLATELETS APPEAR ADEQUATE   Comprehensive metabolic panel  Result Value Ref Range   Sodium 134 (L) 135 - 145 mmol/L   Potassium 4.0 3.5 - 5.1 mmol/L   Chloride 105 101 - 111 mmol/L   CO2  22 22 - 32 mmol/L   Glucose, Bld 105 (H) 65 - 99 mg/dL   BUN 25 (H) 6 - 20 mg/dL   Creatinine, Ser 1.55 (H) 0.61 - 1.24 mg/dL   Calcium 8.8 (L) 8.9 - 10.3 mg/dL   Total Protein 6.4 (L) 6.5 - 8.1 g/dL   Albumin 4.1 3.5 - 5.0 g/dL   AST 20 15 - 41 U/L   ALT 13 (L) 17 - 63 U/L   Alkaline Phosphatase 59 38 - 126 U/L   Total Bilirubin 0.4 0.3 - 1.2 mg/dL   GFR calc non Af Amer 44 (L) >60 mL/min   GFR calc Af Amer 51 (L) >60 mL/min   Anion gap 7 5 - 15      Assessment & Plan:   Problem List Items Addressed This Visit      Cardiovascular and Mediastinum   Essential hypertension - Primary    Due to side effects with hydrochlorothiazide and itching and scratching will discontinue medications and start metoprolol 25 mg.      Relevant Medications   metoprolol succinate (TOPROL-XL) 25 MG 24 hr tablet   Other Relevant Orders   Basic metabolic panel   Senile purpura ([facility])    Has chronic  purpura changes on arms.      Relevant Medications   metoprolol succinate (TOPROL-XL) 25 MG 24 hr tablet     Other   Chronic lymphocytic leukemia ([place])    Followed at the cancer center and doing well.          Follow up plan: Return in about 4 weeks (around 01/15/2017) for Blood pressure check.

## 2016-12-18 NOTE — Assessment & Plan Note (Signed)
Due to side effects with hydrochlorothiazide and itching and scratching will discontinue medications and start metoprolol 25 mg.

## 2016-12-18 NOTE — Assessment & Plan Note (Signed)
Followed at the cancer center and doing well.

## 2016-12-18 NOTE — Assessment & Plan Note (Signed)
Has chronic purpura changes on arms.

## 2016-12-19 ENCOUNTER — Encounter: Payer: Self-pay | Admitting: Family Medicine

## 2016-12-19 LAB — BASIC METABOLIC PANEL
BUN / CREAT RATIO: 11 (ref 10–24)
BUN: 18 mg/dL (ref 8–27)
CO2: 20 mmol/L (ref 18–29)
CREATININE: 1.66 mg/dL — AB (ref 0.76–1.27)
Calcium: 9.2 mg/dL (ref 8.6–10.2)
Chloride: 102 mmol/L (ref 96–106)
GFR, EST AFRICAN AMERICAN: 48 mL/min/{1.73_m2} — AB (ref >59)
GFR, EST NON AFRICAN AMERICAN: 41 mL/min/{1.73_m2} — AB (ref >59)
Glucose: 83 mg/dL (ref 65–99)
Potassium: 4.4 mmol/L (ref 3.5–5.2)
Sodium: 136 mmol/L (ref 134–144)

## 2016-12-27 ENCOUNTER — Encounter: Payer: Self-pay | Admitting: Family Medicine

## 2016-12-27 ENCOUNTER — Ambulatory Visit (INDEPENDENT_AMBULATORY_CARE_PROVIDER_SITE_OTHER): Payer: 59 | Admitting: Family Medicine

## 2016-12-27 DIAGNOSIS — J42 Unspecified chronic bronchitis: Secondary | ICD-10-CM

## 2016-12-27 MED ORDER — ALBUTEROL SULFATE (2.5 MG/3ML) 0.083% IN NEBU
2.5000 mg | INHALATION_SOLUTION | Freq: Once | RESPIRATORY_TRACT | Status: AC
  Administered 2016-12-27: 2.5 mg via RESPIRATORY_TRACT

## 2016-12-27 MED ORDER — AZITHROMYCIN 250 MG PO TABS: ORAL_TABLET | ORAL | 0 refills | Status: DC

## 2016-12-27 MED ORDER — HYDROCOD POLST-CPM POLST ER 10-8 MG/5ML PO SUER: 5.0000 mL | Freq: Two times a day (BID) | ORAL | 0 refills | Status: DC | PRN

## 2016-12-27 MED ORDER — BENZONATATE 100 MG PO CAPS
200.0000 mg | ORAL_CAPSULE | Freq: Three times a day (TID) | ORAL | 0 refills | Status: DC | PRN
Start: 2016-12-27 — End: 2017-01-21

## 2016-12-27 MED ORDER — PREDNISONE 20 MG PO TABS
40.0000 mg | ORAL_TABLET | Freq: Every day | ORAL | 0 refills | Status: DC
Start: 2016-12-27 — End: 2017-01-21

## 2016-12-27 MED ORDER — ALBUTEROL SULFATE HFA 108 (90 BASE) MCG/ACT IN AERS: 2.0000 | INHALATION_SPRAY | Freq: Four times a day (QID) | RESPIRATORY_TRACT | 6 refills | Status: DC | PRN

## 2016-12-27 NOTE — Progress Notes (Signed)
BP 118/71   Pulse 79   Temp 98.2 F (36.8 C)   Wt 126 lb (57.2 kg)   SpO2 98%   BMI 18.21 kg/m    Subjective:    Patient ID: Julian Medina, male    DOB: Jul 03, 1946, 71 y.o.   MRN: [id number]  HPI: Julian Medina is a 71 y.o. male  Chief Complaint  Patient presents with  . Cough    x 2 weeks, cough-occasional productive, congestion,some SOB. No fever, no sore throat, no ear ache.   Patient with over 2 weeks of worsening productive cough, congestion, SOB, wheezing. Denies fever, chills, aches, ear pain, sinus pressure, CP. Has been using advair inhaler and OTC cough remedies with minimal relief. Wife states he's up coughing all night, every night. No sick contacts.   Relevant past medical, surgical, family and social history reviewed and updated as indicated. Interim medical history since our last visit reviewed. Allergies and medications reviewed and updated.  Review of Systems  Constitutional: Negative.   HENT: Positive for congestion.   Respiratory: Positive for cough, shortness of breath and wheezing.   Cardiovascular: Negative.   Gastrointestinal: Negative.   Genitourinary: Negative.   Musculoskeletal: Negative.   Neurological: Negative.   Psychiatric/Behavioral: Negative.    Per HPI unless specifically indicated above     Objective:    BP 118/71   Pulse 79   Temp 98.2 F (36.8 C)   Wt 126 lb (57.2 kg)   SpO2 98%   BMI 18.21 kg/m   Wt Readings from Last 3 Encounters:  12/27/16 126 lb (57.2 kg)  12/18/16 129 lb 12.8 oz (58.9 kg)  11/07/16 130 lb 8 oz (59.2 kg)    Physical Exam  Constitutional: He is oriented to person, place, and time. He appears well-developed and well-nourished. No distress.  HENT:  Head: Atraumatic.  Right Ear: External ear normal.  Left Ear: External ear normal.  Nose: Nose normal.  Mouth/Throat: No oropharyngeal exudate.  Oropharynx injected  Eyes: Conjunctivae are normal. Pupils are equal, round, and reactive to light. No  scleral icterus.  Neck: Normal range of motion. Neck supple.  Cardiovascular: Normal rate and normal heart sounds.   Pulmonary/Chest: Effort normal. No respiratory distress. He has wheezes. He has no rales.  Neurological: He is alert and oriented to person, place, and time.  Skin: Skin is warm and dry.  Psychiatric: He has a normal mood and affect. His behavior is normal.  Nursing note and vitals reviewed.     Assessment & Plan:   Problem List Items Addressed This Visit      Respiratory   COPD (chronic obstructive pulmonary disease) ([place])    With acute exacerbation. Discussed which inhaler is maintenance vs rescue as it appears there was some confusion. Refilled albuterol, and zpack, tessalon, tussionex, and prednisone sent. Continue advair. Nebulizer tx given today in clinic with good relief.       Relevant Medications   albuterol (PROVENTIL HFA;VENTOLIN HFA) 108 (90 Base) MCG/ACT inhaler   albuterol (PROVENTIL) (2.5 MG/3ML) 0.083% nebulizer solution 2.5 mg (Completed)   azithromycin (ZITHROMAX) 250 MG tablet   predniSONE (DELTASONE) 20 MG tablet   benzonatate (TESSALON) 100 MG capsule   chlorpheniramine-HYDROcodone (TUSSIONEX PENNKINETIC ER) 10-8 MG/5ML SUER   Other Relevant Orders   PR DEMO &/OR EVAL,PT USE,AEROSOL DEVICE    Precautions reviewed with tussionex regarding sedation risks, avoiding alcohol, driving, and other potentially dangerous activities.    Follow up plan: Return for as scheduled.

## 2016-12-29 NOTE — Assessment & Plan Note (Signed)
With acute exacerbation. Discussed which inhaler is maintenance vs rescue as it appears there was some confusion. Refilled albuterol, and zpack, tessalon, tussionex, and prednisone sent. Continue advair. Nebulizer tx given today in clinic with good relief.

## 2017-01-21 ENCOUNTER — Ambulatory Visit (INDEPENDENT_AMBULATORY_CARE_PROVIDER_SITE_OTHER): Payer: Managed Care, Other (non HMO) | Admitting: Family Medicine

## 2017-01-21 ENCOUNTER — Encounter: Payer: Self-pay | Admitting: Family Medicine

## 2017-01-21 VITALS — BP 112/63 | HR 67 | Wt 129.0 lb

## 2017-01-21 DIAGNOSIS — I10 Essential (primary) hypertension: Secondary | ICD-10-CM

## 2017-01-21 DIAGNOSIS — C9110 Chronic lymphocytic leukemia of B-cell type not having achieved remission: Secondary | ICD-10-CM

## 2017-01-21 DIAGNOSIS — J42 Unspecified chronic bronchitis: Secondary | ICD-10-CM

## 2017-01-21 DIAGNOSIS — C9190 Lymphoid leukemia, unspecified not having achieved remission: Secondary | ICD-10-CM

## 2017-01-21 MED ORDER — METOPROLOL SUCCINATE ER 25 MG PO TB24: 25.0000 mg | ORAL_TABLET | Freq: Every day | ORAL | 1 refills | Status: DC

## 2017-01-21 NOTE — Assessment & Plan Note (Signed)
The current medical regimen is effective;  continue present plan and medications.  

## 2017-01-21 NOTE — Assessment & Plan Note (Signed)
Patient concerned has developed lymph nodes in both axilla about 4 cm around nontender these seem to developed over this last month. Patient previously had chemotherapy for these nodes and has an appointment with Dr. Grayland Ormond in a couple of weeks for evaluation of CLL and these nodes.

## 2017-01-21 NOTE — Progress Notes (Signed)
BP 112/63   Pulse 67   Wt 129 lb (58.5 kg)   SpO2 98%   BMI 18.64 kg/m    Subjective:    Patient ID: Julian Medina, male    DOB: [birth year], 71 y.o.   MRN: [id number]  HPI: Julian Medina is a 71 y.o. male  Chief Complaint  Patient presents with  . Follow-up  . Hypertension  Patient follow-up hypertension feeling fine no issues. Itching has stopped with stopping hydrochlorothiazide. Is taking metoprolol with no side effects or issues and good blood pressure control. COPD exacerbation has resolved and patient doing well no real blood pressure excursions while on prednisone.  Relevant past medical, surgical, family and social history reviewed and updated as indicated. Interim medical history since our last visit reviewed. Allergies and medications reviewed and updated.  Review of Systems  Constitutional: Negative.   Respiratory: Negative.   Cardiovascular: Negative.     Per HPI unless specifically indicated above     Objective:    BP 112/63   Pulse 67   Wt 129 lb (58.5 kg)   SpO2 98%   BMI 18.64 kg/m   Wt Readings from Last 3 Encounters:  01/21/17 129 lb (58.5 kg)  12/27/16 126 lb (57.2 kg)  12/18/16 129 lb 12.8 oz (58.9 kg)    Physical Exam  Constitutional: He is oriented to person, place, and time. He appears well-developed and well-nourished.  HENT:  Head: Normocephalic and atraumatic.  Eyes: Conjunctivae and EOM are normal.  Neck: Normal range of motion.  Cardiovascular: Normal rate, regular rhythm and normal heart sounds.   Pulmonary/Chest: Effort normal and breath sounds normal.  Musculoskeletal: Normal range of motion.  Neurological: He is alert and oriented to person, place, and time.  Skin: No erythema.  Psychiatric: He has a normal mood and affect. His behavior is normal. Judgment and thought content normal.    Results for orders placed or performed in visit on 23/76/28  Basic metabolic panel  Result Value Ref Range   Glucose 83 65 - 99 mg/dL   BUN 18 8 - 27 mg/dL   Creatinine, Ser 1.66 (H) 0.76 - 1.27 mg/dL   GFR calc non Af Amer 41 (L) >59 mL/min/1.73   GFR calc Af Amer 48 (L) >59 mL/min/1.73   BUN/Creatinine Ratio 11 10 - 24   Sodium 136 134 - 144 mmol/L   Potassium 4.4 3.5 - 5.2 mmol/L   Chloride 102 96 - 106 mmol/L   CO2 20 18 - 29 mmol/L   Calcium 9.2 8.6 - 10.2 mg/dL      Assessment & Plan:   Problem List Items Addressed This Visit      Cardiovascular and Mediastinum   Essential hypertension - Primary    The current medical regimen is effective;  continue present plan and medications.       Relevant Medications   metoprolol succinate (TOPROL-XL) 25 MG 24 hr tablet     Respiratory   COPD (chronic obstructive pulmonary disease) (HCC)    The current medical regimen is effective;  continue present plan and medications.         Other   Chronic lymphocytic leukemia ([place])    Patient concerned has developed lymph nodes in both axilla about 4 cm around nontender these seem to developed over this last month. Patient previously had chemotherapy for these nodes and has an appointment with Dr. Grayland Ormond in a couple of weeks for evaluation of CLL and these nodes.  Follow up plan: Return in about 3 months (around 04/23/2017) for Physical Exam.

## 2017-02-05 NOTE — Progress Notes (Signed)
[place]  Telephone:(336) [phone] Fax:(336) [phone]  ID: Julian Medina OB: [birth year]  MR#: [id number]  QIO#:[id number]  Patient Care Team: Guadalupe Maple, MD as PCP - General (Family Medicine) Lloyd Huger, MD as Consulting Physician (Oncology) [name], MD as Consulting Physician (Urology)  CHIEF COMPLAINT: CLL  INTERVAL HISTORY: Patient returns to clinic today for further evaluation and laboratory work. He has noted increasing lymphadenopathy in his bilateral axilla. He otherwise feels well. He continues to be highly anxious. He denies any recent fevers, night sweats, or weight loss. He has no neurologic complaints. He denies any pain. He has no chest pain or shortness of breath. He denies any nausea, vomiting, constipation, or diarrhea. He has no urinary complaints. Patient offers no further specific complaints today.   REVIEW OF SYSTEMS:   Review of Systems  Constitutional: Negative for diaphoresis, fever, malaise/fatigue and weight loss.  Respiratory: Negative.  Negative for cough and shortness of breath.   Cardiovascular: Negative.  Negative for chest pain.  Gastrointestinal: Negative.  Negative for abdominal pain.  Genitourinary: Negative.   Musculoskeletal: Negative.   Neurological: Negative.  Negative for weakness.  Psychiatric/Behavioral: The patient is nervous/anxious.     As per HPI. Otherwise, a complete review of systems is negative.  PAST MEDICAL HISTORY: Past Medical History:  Diagnosis Date  . Anxiety   . CLL (chronic lymphocytic leukemia) ([place])   . COPD (chronic obstructive pulmonary disease) ([place])     PAST SURGICAL HISTORY: Past Surgical History:  Procedure Laterality Date  . HERNIA REPAIR    . PERIPHERAL VASCULAR CATHETERIZATION N/A 03/14/2016   Procedure: Glori Luis Cath Insertion;  Surgeon: Algernon Huxley, MD;  Location: [facility];  Service: Cardiovascular;  Laterality: N/A;  . ROBOTIC ASSITED PARTIAL  NEPHRECTOMY Left 11/14/2015   Procedure: ROBOTIC ASSITED PARTIAL NEPHRECTOMY with intraoperative drop in ultrasound / Extensive Lysis of adhesions;  Surgeon: [name], MD;  Location: [facility];  Service: Urology;  Laterality: Left;    FAMILY HISTORY: Heart disease and hypertension.     ADVANCED DIRECTIVES:    HEALTH MAINTENANCE: Social History  Substance Use Topics  . Smoking status: Current Every Day Smoker    Packs/day: 1.50    Years: 40.00    Types: Cigarettes  . Smokeless tobacco: Never Used  . Alcohol use 4.2 oz/week    7 Cans of beer per week     Comment: occasional     Colonoscopy:  PAP:  Bone density:  Lipid panel:  Allergies  Allergen Reactions  . Aspirin Other (See Comments)    Ulcer   . Hydrochlorothiazide Itching    Current Outpatient Prescriptions  Medication Sig Dispense Refill  . ADVAIR DISKUS 250-50 MCG/DOSE AEPB INHALE 1 PUFF 2 TIMES A DAY 180 each 3  . albuterol (PROVENTIL HFA;VENTOLIN HFA) 108 (90 Base) MCG/ACT inhaler Inhale 2 puffs into the lungs every 6 (six) hours as needed for wheezing or shortness of breath. 1 Inhaler 6  . benazepril (LOTENSIN) 40 MG tablet 1 TABLET ONCE EACH DAY ORAL 90 tablet 4  . LORazepam (ATIVAN) 1 MG tablet Take 1 tablet (1 mg total) by mouth daily as needed for anxiety. 30 tablet 0  . metoprolol succinate (TOPROL-XL) 25 MG 24 hr tablet Take 1 tablet (25 mg total) by mouth daily. 90 tablet 1   No current facility-administered medications for this visit.     OBJECTIVE: Vitals:   02/06/17 1539  BP: (!) 144/83  Pulse: 72  Resp: 20  Temp: 97.6 F (36.4 C)     Body mass index is 18.74 kg/m.    ECOG FS:0 - Asymptomatic  General: Well-developed, well-nourished, no acute distress. Eyes: Pink conjunctiva, anicteric sclera. Lungs: Clear to auscultation bilaterally. Heart: Regular rate and rhythm. No rubs, murmurs, or gallops. Abdomen: Soft, nontender, nondistended. No organomegaly noted, normoactive bowel  sounds. Musculoskeletal: No edema, cyanosis, or clubbing. Neuro: Alert, answering all questions appropriately. Cranial nerves grossly intact. Skin: No rashes or petechiae noted. Psych: Normal affect. Lymphatics: Easily palpable bilateral axillary lymphadenopathy.  LAB RESULTS:  Lab Results  Component Value Date   NA 135 02/06/2017   K 4.0 02/06/2017   CL 105 02/06/2017   CO2 24 02/06/2017   GLUCOSE 101 (H) 02/06/2017   BUN 14 02/06/2017   CREATININE 1.67 (H) 02/06/2017   CALCIUM 9.4 02/06/2017   PROT 6.4 (L) 02/06/2017   ALBUMIN 4.2 02/06/2017   AST 24 02/06/2017   ALT 13 (L) 02/06/2017   ALKPHOS 62 02/06/2017   BILITOT 0.5 02/06/2017   GFRNONAA 40 (L) 02/06/2017   GFRAA 47 (L) 02/06/2017    Lab Results  Component Value Date   WBC 11.6 (H) 02/06/2017   NEUTROABS 7.9 (H) 02/06/2017   HGB 11.3 (L) 02/06/2017   HCT 32.8 (L) 02/06/2017   MCV 87.8 02/06/2017   PLT 201 02/06/2017     STUDIES: No results found.  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   PLAN:    1.  CLL: Flow cytometry confirmed the diagnosis.  Patient is also ZAP-70 positive this which indicates it may be a more aggressive form of CLL. Although patient's white blood cell count is only mildly elevated, he has developed new bilateral axillary lymphadenopathy. Will get CT scan of neck, chest, abdomen, and pelvis to further identify the extent of his disease. Patient will then return to clinic in 2 weeks to discuss the results and treatment planning. He most recently received treatment with Rituxan and Treanda on November 2&3, 2017.   2.  Anxiety: Continue Xanax PRN. 3.  Renal cell carcinoma: Patient is status post partial nephrectomy on Nov 14, 2015. Continued follow-up per urology. CT results as above. 4.  Renal insufficiency: Patient's creatinine slightly elevated today, monitor.   Patient expressed understanding and was in agreement with  this plan. He also understands that He can call clinic at any time with any questions, concerns, or complaints.    Lloyd Huger, MD   02/08/2017 7:43 AM

## 2017-02-06 ENCOUNTER — Other Ambulatory Visit: Payer: Managed Care, Other (non HMO)

## 2017-02-06 ENCOUNTER — Inpatient Hospital Stay: Payer: Managed Care, Other (non HMO) | Attending: Oncology | Admitting: Oncology

## 2017-02-06 ENCOUNTER — Inpatient Hospital Stay: Payer: Managed Care, Other (non HMO)

## 2017-02-06 VITALS — BP 144/83 | HR 72 | Temp 97.6°F | Resp 20 | Wt 129.7 lb

## 2017-02-06 DIAGNOSIS — R591 Generalized enlarged lymph nodes: Secondary | ICD-10-CM | POA: Insufficient documentation

## 2017-02-06 DIAGNOSIS — J449 Chronic obstructive pulmonary disease, unspecified: Secondary | ICD-10-CM | POA: Insufficient documentation

## 2017-02-06 DIAGNOSIS — N2889 Other specified disorders of kidney and ureter: Secondary | ICD-10-CM | POA: Diagnosis not present

## 2017-02-06 DIAGNOSIS — Z95828 Presence of other vascular implants and grafts: Secondary | ICD-10-CM

## 2017-02-06 DIAGNOSIS — Z79899 Other long term (current) drug therapy: Secondary | ICD-10-CM | POA: Diagnosis not present

## 2017-02-06 DIAGNOSIS — C9110 Chronic lymphocytic leukemia of B-cell type not having achieved remission: Secondary | ICD-10-CM

## 2017-02-06 DIAGNOSIS — C9190 Lymphoid leukemia, unspecified not having achieved remission: Secondary | ICD-10-CM

## 2017-02-06 DIAGNOSIS — C642 Malignant neoplasm of left kidney, except renal pelvis: Secondary | ICD-10-CM

## 2017-02-06 DIAGNOSIS — F419 Anxiety disorder, unspecified: Secondary | ICD-10-CM | POA: Diagnosis not present

## 2017-02-06 DIAGNOSIS — Z85528 Personal history of other malignant neoplasm of kidney: Secondary | ICD-10-CM | POA: Diagnosis not present

## 2017-02-06 DIAGNOSIS — F17210 Nicotine dependence, cigarettes, uncomplicated: Secondary | ICD-10-CM | POA: Diagnosis not present

## 2017-02-06 LAB — COMPREHENSIVE METABOLIC PANEL
ALK PHOS: 62 U/L (ref 38–126)
ALT: 13 U/L — ABNORMAL LOW (ref 17–63)
ANION GAP: 6 (ref 5–15)
AST: 24 U/L (ref 15–41)
Albumin: 4.2 g/dL (ref 3.5–5.0)
BUN: 14 mg/dL (ref 6–20)
CALCIUM: 9.4 mg/dL (ref 8.9–10.3)
CO2: 24 mmol/L (ref 22–32)
Chloride: 105 mmol/L (ref 101–111)
Creatinine, Ser: 1.67 mg/dL — ABNORMAL HIGH (ref 0.61–1.24)
GFR calc Af Amer: 47 mL/min — ABNORMAL LOW (ref >60)
GFR calc non Af Amer: 40 mL/min — ABNORMAL LOW (ref >60)
Glucose, Bld: 101 mg/dL — ABNORMAL HIGH (ref 65–99)
Potassium: 4 mmol/L (ref 3.5–5.1)
SODIUM: 135 mmol/L (ref 135–145)
Total Bilirubin: 0.5 mg/dL (ref 0.3–1.2)
Total Protein: 6.4 g/dL — ABNORMAL LOW (ref 6.5–8.1)

## 2017-02-06 LAB — CBC WITH DIFFERENTIAL/PLATELET
BASOS ABS: 0.2 10*3/uL — AB (ref 0–0.1)
Basophils Relative: 2 %
EOS ABS: 0.8 10*3/uL — AB (ref 0–0.7)
Eosinophils Relative: 7 %
HCT: 32.8 % — ABNORMAL LOW (ref 40.0–52.0)
HEMOGLOBIN: 11.3 g/dL — AB (ref 13.0–18.0)
LYMPHS ABS: 2.1 10*3/uL (ref 1.0–3.6)
Lymphocytes Relative: 18 %
MCH: 30.3 pg (ref 26.0–34.0)
MCHC: 34.5 g/dL (ref 32.0–36.0)
MCV: 87.8 fL (ref 80.0–100.0)
MONO ABS: 0.6 10*3/uL (ref 0.2–1.0)
Monocytes Relative: 5 %
NEUTROS ABS: 7.9 10*3/uL — AB (ref 1.4–6.5)
Neutrophils Relative %: 68 %
PLATELETS: 201 10*3/uL (ref 150–440)
RBC: 3.74 MIL/uL — ABNORMAL LOW (ref 4.40–5.90)
RDW: 17.3 % — ABNORMAL HIGH (ref 11.5–14.5)
Smear Review: ADEQUATE
WBC: 11.6 10*3/uL — ABNORMAL HIGH (ref 3.8–10.6)

## 2017-02-06 MED ORDER — SODIUM CHLORIDE 0.9% FLUSH
10.0000 mL | INTRAVENOUS | Status: DC | PRN
  Administered 2017-02-06: 10 mL via INTRAVENOUS
  Filled 2017-02-06: qty 10

## 2017-02-06 MED ORDER — HEPARIN SOD (PORK) LOCK FLUSH 100 UNIT/ML IV SOLN
500.0000 [IU] | Freq: Once | INTRAVENOUS | Status: AC
  Administered 2017-02-06: 500 [IU] via INTRAVENOUS

## 2017-02-06 NOTE — Progress Notes (Signed)
Patient here today for follow up regarding CLL, patient reports swelling to bilateral axillary lymph nodes. Patient denies pain today.

## 2017-02-12 ENCOUNTER — Ambulatory Visit: Admission: RE | Admit: 2017-02-12 | Payer: Managed Care, Other (non HMO) | Source: Ambulatory Visit

## 2017-02-17 ENCOUNTER — Inpatient Hospital Stay: Payer: 59 | Attending: Oncology

## 2017-02-17 ENCOUNTER — Ambulatory Visit
Admission: RE | Admit: 2017-02-17 | Discharge: 2017-02-17 | Disposition: A | Discharge status: Home / Self Care | Payer: 59 | Source: Ambulatory Visit | Attending: Oncology | Admitting: Oncology

## 2017-02-17 DIAGNOSIS — I2510 Atherosclerotic heart disease of native coronary artery without angina pectoris: Secondary | ICD-10-CM | POA: Diagnosis not present

## 2017-02-17 DIAGNOSIS — C9110 Chronic lymphocytic leukemia of B-cell type not having achieved remission: Secondary | ICD-10-CM | POA: Insufficient documentation

## 2017-02-17 DIAGNOSIS — J449 Chronic obstructive pulmonary disease, unspecified: Secondary | ICD-10-CM | POA: Diagnosis not present

## 2017-02-17 DIAGNOSIS — R591 Generalized enlarged lymph nodes: Secondary | ICD-10-CM | POA: Diagnosis not present

## 2017-02-17 DIAGNOSIS — C642 Malignant neoplasm of left kidney, except renal pelvis: Secondary | ICD-10-CM | POA: Insufficient documentation

## 2017-02-17 DIAGNOSIS — J432 Centrilobular emphysema: Secondary | ICD-10-CM | POA: Diagnosis not present

## 2017-02-17 DIAGNOSIS — Z5112 Encounter for antineoplastic immunotherapy: Secondary | ICD-10-CM | POA: Diagnosis not present

## 2017-02-17 DIAGNOSIS — Z95828 Presence of other vascular implants and grafts: Secondary | ICD-10-CM

## 2017-02-17 DIAGNOSIS — R590 Localized enlarged lymph nodes: Secondary | ICD-10-CM | POA: Insufficient documentation

## 2017-02-17 DIAGNOSIS — L723 Sebaceous cyst: Secondary | ICD-10-CM | POA: Insufficient documentation

## 2017-02-17 DIAGNOSIS — R918 Other nonspecific abnormal finding of lung field: Secondary | ICD-10-CM | POA: Diagnosis not present

## 2017-02-17 DIAGNOSIS — M5126 Other intervertebral disc displacement, lumbar region: Secondary | ICD-10-CM | POA: Insufficient documentation

## 2017-02-17 DIAGNOSIS — N289 Disorder of kidney and ureter, unspecified: Secondary | ICD-10-CM | POA: Insufficient documentation

## 2017-02-17 DIAGNOSIS — I700 Atherosclerosis of aorta: Secondary | ICD-10-CM | POA: Diagnosis not present

## 2017-02-17 DIAGNOSIS — Z905 Acquired absence of kidney: Secondary | ICD-10-CM | POA: Diagnosis not present

## 2017-02-17 DIAGNOSIS — F17210 Nicotine dependence, cigarettes, uncomplicated: Secondary | ICD-10-CM | POA: Insufficient documentation

## 2017-02-17 DIAGNOSIS — F419 Anxiety disorder, unspecified: Secondary | ICD-10-CM | POA: Diagnosis not present

## 2017-02-17 MED ORDER — IOPAMIDOL (ISOVUE-300) INJECTION 61%
80.0000 mL | Freq: Once | INTRAVENOUS | Status: AC | PRN
  Administered 2017-02-17: 80 mL via INTRAVENOUS

## 2017-02-17 MED ORDER — HEPARIN SOD (PORK) LOCK FLUSH 100 UNIT/ML IV SOLN
500.0000 [IU] | Freq: Once | INTRAVENOUS | Status: AC
  Administered 2017-02-17: 500 [IU] via INTRAVENOUS

## 2017-02-17 MED ORDER — HEPARIN SOD (PORK) LOCK FLUSH 100 UNIT/ML IV SOLN
INTRAVENOUS | Status: AC
  Filled 2017-02-17: qty 5

## 2017-02-17 MED ORDER — SODIUM CHLORIDE 0.9% FLUSH
10.0000 mL | INTRAVENOUS | Status: DC | PRN
  Administered 2017-02-17: 10 mL via INTRAVENOUS
  Filled 2017-02-17: qty 10

## 2017-02-19 NOTE — Progress Notes (Signed)
[place]  Telephone:(336) [phone] Fax:(336) [phone]  ID: COURTNEY FENLON OB: 08-10-1945  MR#: [id number]  CBJ#:[id number]  Patient Care Team: Guadalupe Maple, MD as PCP - General (Family Medicine) Lloyd Huger, MD as Consulting Physician (Oncology) [name], MD as Consulting Physician (Urology)  CHIEF COMPLAINT: CLL  INTERVAL HISTORY: Patient returns to clinic today for further evaluation, discussion of his imaging results, and treatment planning. He continues to be highly anxious. He denies any recent fevers, night sweats, or weight loss. He has no neurologic complaints. He denies any pain. He has no chest pain or shortness of breath. He denies any nausea, vomiting, constipation, or diarrhea. He has no urinary complaints. Patient offers no further specific complaints today.   REVIEW OF SYSTEMS:   Review of Systems  Constitutional: Negative for diaphoresis, fever, malaise/fatigue and weight loss.  Respiratory: Negative.  Negative for cough and shortness of breath.   Cardiovascular: Negative.  Negative for chest pain and leg swelling.  Gastrointestinal: Negative.  Negative for abdominal pain.  Genitourinary: Negative.   Musculoskeletal: Negative.   Skin: Negative.  Negative for rash.  Neurological: Negative.  Negative for sensory change and weakness.  Psychiatric/Behavioral: The patient is nervous/anxious.     As per HPI. Otherwise, a complete review of systems is negative.  PAST MEDICAL HISTORY: Past Medical History:  Diagnosis Date  . Anxiety   . CLL (chronic lymphocytic leukemia) ([place])   . COPD (chronic obstructive pulmonary disease) ([place])     PAST SURGICAL HISTORY: Past Surgical History:  Procedure Laterality Date  . HERNIA REPAIR    . PERIPHERAL VASCULAR CATHETERIZATION N/A 03/14/2016   Procedure: Glori Luis Cath Insertion;  Surgeon: Algernon Huxley, MD;  Location: [facility];  Service: Cardiovascular;  Laterality: N/A;  . ROBOTIC  ASSITED PARTIAL NEPHRECTOMY Left 11/14/2015   Procedure: ROBOTIC ASSITED PARTIAL NEPHRECTOMY with intraoperative drop in ultrasound / Extensive Lysis of adhesions;  Surgeon: [name], MD;  Location: [facility];  Service: Urology;  Laterality: Left;    FAMILY HISTORY: Heart disease and hypertension.     ADVANCED DIRECTIVES:    HEALTH MAINTENANCE: Social History  Substance Use Topics  . Smoking status: Current Every Day Smoker    Packs/day: 1.50    Years: 40.00    Types: Cigarettes  . Smokeless tobacco: Never Used  . Alcohol use 4.2 oz/week    7 Cans of beer per week     Comment: occasional     Colonoscopy:  PAP:  Bone density:  Lipid panel:  Allergies  Allergen Reactions  . Aspirin Other (See Comments)    Ulcer   . Hydrochlorothiazide Itching    Current Outpatient Prescriptions  Medication Sig Dispense Refill  . ADVAIR DISKUS 250-50 MCG/DOSE AEPB INHALE 1 PUFF 2 TIMES A DAY 180 each 3  . albuterol (PROVENTIL HFA;VENTOLIN HFA) 108 (90 Base) MCG/ACT inhaler Inhale 2 puffs into the lungs every 6 (six) hours as needed for wheezing or shortness of breath. 1 Inhaler 6  . benazepril (LOTENSIN) 40 MG tablet 1 TABLET ONCE EACH DAY ORAL 90 tablet 4  . LORazepam (ATIVAN) 1 MG tablet Take 1 tablet (1 mg total) by mouth daily as needed for anxiety. 30 tablet 0  . metoprolol succinate (TOPROL-XL) 25 MG 24 hr tablet Take 1 tablet (25 mg total) by mouth daily. 90 tablet 1   No current facility-administered medications for this visit.     OBJECTIVE: Vitals:   02/20/17 1549  BP: 126/78  Pulse:  74  Resp: 20  Temp: (!) 96.9 F (36.1 C)     Body mass index is 18.69 kg/m.    ECOG FS:0 - Asymptomatic  General: Well-developed, well-nourished, no acute distress. Eyes: Pink conjunctiva, anicteric sclera. Lungs: Clear to auscultation bilaterally. Heart: Regular rate and rhythm. No rubs, murmurs, or gallops. Abdomen: Soft, nontender, nondistended. No organomegaly noted, normoactive  bowel sounds. Musculoskeletal: No edema, cyanosis, or clubbing. Neuro: Alert, answering all questions appropriately. Cranial nerves grossly intact. Skin: No rashes or petechiae noted. Psych: Normal affect. Lymphatics: Easily palpable bilateral axillary lymphadenopathy.  LAB RESULTS:  Lab Results  Component Value Date   NA 135 02/06/2017   K 4.0 02/06/2017   CL 105 02/06/2017   CO2 24 02/06/2017   GLUCOSE 101 (H) 02/06/2017   BUN 14 02/06/2017   CREATININE 1.67 (H) 02/06/2017   CALCIUM 9.4 02/06/2017   PROT 6.4 (L) 02/06/2017   ALBUMIN 4.2 02/06/2017   AST 24 02/06/2017   ALT 13 (L) 02/06/2017   ALKPHOS 62 02/06/2017   BILITOT 0.5 02/06/2017   GFRNONAA 40 (L) 02/06/2017   GFRAA 47 (L) 02/06/2017    Lab Results  Component Value Date   WBC 11.6 (H) 02/06/2017   NEUTROABS 7.9 (H) 02/06/2017   HGB 11.3 (L) 02/06/2017   HCT 32.8 (L) 02/06/2017   MCV 87.8 02/06/2017   PLT 201 02/06/2017     STUDIES: Ct Soft Tissue Neck W Contrast  Result Date: 02/17/2017 CLINICAL DATA:  Chronic lymphocytic leukemia.  Restaging. EXAM: CT NECK WITH CONTRAST TECHNIQUE: Multidetector CT imaging of the neck was performed using the standard protocol following the bolus administration of intravenous contrast. CONTRAST:  64mL ISOVUE-300 IOPAMIDOL (ISOVUE-300) INJECTION 61% COMPARISON:  CT neck March 05, 2016 FINDINGS: PHARYNX AND LARYNX: Normal.  Widely patent airway. SALIVARY GLANDS: Normal. THYROID: Normal. LYMPH NODES: Improved multilevel lymphadenopathy. LEFT level IIb lymph node was 16 mm short access, now 11 mm. LEFT level 3 lymph node was 17 mm, not 10 mm. RIGHT supraclavicular 6 mm lymph node was 10 mm. VASCULAR: Atherosclerosis resulting in potential cyst in stenosis LEFT internal carotid artery origin. LIMITED INTRACRANIAL: Nonacute. Moderate calcific atherosclerosis carotid bifurcations. VISUALIZED ORBITS: Normal. MASTOIDS AND VISUALIZED PARANASAL SINUSES: Well-aerated. SKELETON: Nonacute.  Severe C3-4 through C6-7 degenerative discs. Patient is edentulous. UPPER CHEST: Axillary lymphadenopathy. Please see dedicated CT chest from same day, reported separately. OTHER: Stable LEFT cerebral sebaceous cyst. Resolution of LEFT auricle cyst. RIGHT chest Port-A-Cath. IMPRESSION: 1. Decreased cervical lymphadenopathy consistent with treatment response. 2. No acute process in the neck. Electronically Signed   By: Elon Alas M.D.   On: 02/17/2017 18:37   Ct Chest W Contrast  Result Date: 02/18/2017 CLINICAL DATA:  Chronic lymphocytic leukemia.  Restaging.  COPD. EXAM: CT CHEST, ABDOMEN, AND PELVIS WITH CONTRAST TECHNIQUE: Multidetector CT imaging of the chest, abdomen and pelvis was performed following the standard protocol during bolus administration of intravenous contrast. CONTRAST:  55mL ISOVUE-300 IOPAMIDOL (ISOVUE-300) INJECTION 61% COMPARISON:  10/29/2016 FINDINGS: CT CHEST FINDINGS Cardiovascular: Right-sided Port-A-Cath which terminates at the low SVC. Aortic and branch vessel atherosclerosis. Normal heart size, without pericardial effusion. Multivessel coronary artery atherosclerosis. No central pulmonary embolism, on this non-dedicated study. Mediastinum/Nodes: Progressive bilateral axillary adenopathy. Index left axillary node measures 2.8 cm on image 15/ series 2 versus 1.6 cm on the prior exam Precarinal node measures 11 mm on image 28/series 2 versus 7 mm on the prior. Right hilar adenopathy at 3.1 x 3.3 cm on image 32/series 2. Compare  2.2 x 2.8 cm on the prior exam (when remeasured). Lungs/Pleura: No pleural fluid. Mild to moderate centrilobular emphysema. Right apical ground-glass nodule measures 7 mm on image 27/series 4 and is not significantly changed. There is also vague sub solid left lower lobe pulmonary nodule on the order of 7 mm on image 50/series 4. This is not significantly changed. The previously described left upper lobe 5 mm nodule has resolved. Musculoskeletal: No acute  osseous abnormality. CT ABDOMEN PELVIS FINDINGS Hepatobiliary: Normal liver. Normal gallbladder, without biliary ductal dilatation. Pancreas: Normal, without mass or ductal dilatation. Spleen: Normal in size, without focal abnormality. Adrenals/Urinary Tract: Normal adrenal glands. Mild renal cortical thinning bilaterally. Bilateral too small to characterize renal lesions. No hydronephrosis. Improved to resolved bladder wall thickening. Stomach/Bowel: Surgeon changes about the gastroesophageal junction. Normal colon. Small bowel is again positioned at the entrance to the left inguinal canal, including on image 117/series 2. Vascular/Lymphatic: Aortic and branch vessel atherosclerosis. Progressive retroperitoneal adenopathy. Index left periaortic node measures 2.5 cm on image 79/series 2 versus 1.0 cm on the prior. Porta hepatis node measures 2.7 cm on image 62/series 2 versus 1.7 cm previously Right external iliac node measures 3.6 x 6.2 cm on image 107/series 2. Compare 3.4 x 4.2 cm on the prior exam (when remeasured). Left external iliac index node measures 3.1 cm on image 107/series 2 versus 1.7 cm on the prior. Reproductive: Normal prostate. Other: No significant free fluid.  Prior midline laparotomy. Musculoskeletal: No acute osseous abnormality. Disc bulges at L5-S1 and less so L4-5. Convex right lumbar spine curvature. IMPRESSION: 1. Significant progression of adenopathy within the chest, abdomen, and pelvis. 2. Resolution of previously described left upper lobe pulmonary nodule. There are bilateral ground-glass nodules which are similar and warrant followup attention. 3. Coronary artery atherosclerosis. Aortic Atherosclerosis (ICD10-I70.0). 4.  Emphysema (ICD10-J43.9). 5. Improvement to resolution of bladder wall thickening. 6. Small bowel positioned at the entrance to the left inguinal canal. Similar. Electronically Signed   By: Abigail Miyamoto M.D.   On: 02/18/2017 08:52   Ct Abdomen Pelvis W  Contrast  Result Date: 02/18/2017 CLINICAL DATA:  Chronic lymphocytic leukemia.  Restaging.  COPD. EXAM: CT CHEST, ABDOMEN, AND PELVIS WITH CONTRAST TECHNIQUE: Multidetector CT imaging of the chest, abdomen and pelvis was performed following the standard protocol during bolus administration of intravenous contrast. CONTRAST:  34mL ISOVUE-300 IOPAMIDOL (ISOVUE-300) INJECTION 61% COMPARISON:  10/29/2016 FINDINGS: CT CHEST FINDINGS Cardiovascular: Right-sided Port-A-Cath which terminates at the low SVC. Aortic and branch vessel atherosclerosis. Normal heart size, without pericardial effusion. Multivessel coronary artery atherosclerosis. No central pulmonary embolism, on this non-dedicated study. Mediastinum/Nodes: Progressive bilateral axillary adenopathy. Index left axillary node measures 2.8 cm on image 15/ series 2 versus 1.6 cm on the prior exam Precarinal node measures 11 mm on image 28/series 2 versus 7 mm on the prior. Right hilar adenopathy at 3.1 x 3.3 cm on image 32/series 2. Compare 2.2 x 2.8 cm on the prior exam (when remeasured). Lungs/Pleura: No pleural fluid. Mild to moderate centrilobular emphysema. Right apical ground-glass nodule measures 7 mm on image 27/series 4 and is not significantly changed. There is also vague sub solid left lower lobe pulmonary nodule on the order of 7 mm on image 50/series 4. This is not significantly changed. The previously described left upper lobe 5 mm nodule has resolved. Musculoskeletal: No acute osseous abnormality. CT ABDOMEN PELVIS FINDINGS Hepatobiliary: Normal liver. Normal gallbladder, without biliary ductal dilatation. Pancreas: Normal, without mass or ductal dilatation. Spleen: Normal in  size, without focal abnormality. Adrenals/Urinary Tract: Normal adrenal glands. Mild renal cortical thinning bilaterally. Bilateral too small to characterize renal lesions. No hydronephrosis. Improved to resolved bladder wall thickening. Stomach/Bowel: Surgeon changes about the  gastroesophageal junction. Normal colon. Small bowel is again positioned at the entrance to the left inguinal canal, including on image 117/series 2. Vascular/Lymphatic: Aortic and branch vessel atherosclerosis. Progressive retroperitoneal adenopathy. Index left periaortic node measures 2.5 cm on image 79/series 2 versus 1.0 cm on the prior. Porta hepatis node measures 2.7 cm on image 62/series 2 versus 1.7 cm previously Right external iliac node measures 3.6 x 6.2 cm on image 107/series 2. Compare 3.4 x 4.2 cm on the prior exam (when remeasured). Left external iliac index node measures 3.1 cm on image 107/series 2 versus 1.7 cm on the prior. Reproductive: Normal prostate. Other: No significant free fluid.  Prior midline laparotomy. Musculoskeletal: No acute osseous abnormality. Disc bulges at L5-S1 and less so L4-5. Convex right lumbar spine curvature. IMPRESSION: 1. Significant progression of adenopathy within the chest, abdomen, and pelvis. 2. Resolution of previously described left upper lobe pulmonary nodule. There are bilateral ground-glass nodules which are similar and warrant followup attention. 3. Coronary artery atherosclerosis. Aortic Atherosclerosis (ICD10-I70.0). 4.  Emphysema (ICD10-J43.9). 5. Improvement to resolution of bladder wall thickening. 6. Small bowel positioned at the entrance to the left inguinal canal. Similar. Electronically Signed   By: Abigail Miyamoto M.D.   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   PLAN:    1.  CLL: Flow cytometry confirmed the diagnosis.  Patient is also ZAP-70 positive this which indicates it may be a more aggressive form of CLL. CT scan results reviewed independently and reported as above with progressive disease in patients chest, abdomen, and pelvis. His white blood cell count only remained mildly elevated. After lengthy discussion, he wishes to reinitiate chemotherapy  using Treanda and Rituxan. Plan on 3-4 cycles every 28 days and then reimage. Patient also expressed understanding that he may require more aggressive treatment possibly using FCR in the future. Return to clinic on February 27, 2017 to initiate cycle 1.  He most recently received treatment with Rituxan and Treanda on November 2&3, 2017.   2.  Anxiety: Continue Xanax PRN. 3.  Renal cell carcinoma: Patient is status post partial nephrectomy on Nov 14, 2015. Continued follow-up per urology. CT results as above.  4.  Renal insufficiency: Patient's creatinine slightly elevated today, monitor.  Approximately 30 minutes was spent in discussion of which greater than 50% was consultation.   Patient expressed understanding and was in agreement with this plan. He also understands that He can call clinic at any time with any questions, concerns, or complaints.    Lloyd Huger, MD   02/23/2017 9:48 AM

## 2017-02-20 ENCOUNTER — Inpatient Hospital Stay (HOSPITAL_BASED_OUTPATIENT_CLINIC_OR_DEPARTMENT_OTHER): Payer: 59 | Admitting: Oncology

## 2017-02-20 VITALS — BP 126/78 | HR 74 | Temp 96.9°F | Resp 20 | Wt 129.3 lb

## 2017-02-20 DIAGNOSIS — N289 Disorder of kidney and ureter, unspecified: Secondary | ICD-10-CM | POA: Diagnosis not present

## 2017-02-20 DIAGNOSIS — R590 Localized enlarged lymph nodes: Secondary | ICD-10-CM

## 2017-02-20 DIAGNOSIS — F419 Anxiety disorder, unspecified: Secondary | ICD-10-CM | POA: Diagnosis not present

## 2017-02-20 DIAGNOSIS — Z905 Acquired absence of kidney: Secondary | ICD-10-CM | POA: Diagnosis not present

## 2017-02-20 DIAGNOSIS — L723 Sebaceous cyst: Secondary | ICD-10-CM | POA: Diagnosis not present

## 2017-02-20 DIAGNOSIS — Z5112 Encounter for antineoplastic immunotherapy: Secondary | ICD-10-CM

## 2017-02-20 DIAGNOSIS — C9110 Chronic lymphocytic leukemia of B-cell type not having achieved remission: Secondary | ICD-10-CM | POA: Diagnosis not present

## 2017-02-20 DIAGNOSIS — I700 Atherosclerosis of aorta: Secondary | ICD-10-CM | POA: Diagnosis not present

## 2017-02-20 DIAGNOSIS — C642 Malignant neoplasm of left kidney, except renal pelvis: Secondary | ICD-10-CM | POA: Diagnosis not present

## 2017-02-20 DIAGNOSIS — M5126 Other intervertebral disc displacement, lumbar region: Secondary | ICD-10-CM | POA: Diagnosis not present

## 2017-02-20 DIAGNOSIS — J449 Chronic obstructive pulmonary disease, unspecified: Secondary | ICD-10-CM | POA: Diagnosis not present

## 2017-02-20 DIAGNOSIS — F17210 Nicotine dependence, cigarettes, uncomplicated: Secondary | ICD-10-CM

## 2017-02-20 NOTE — Progress Notes (Signed)
Patient denies any concerns today, here for CT results.   

## 2017-02-26 NOTE — Progress Notes (Signed)
[place]  Telephone:(336) [phone] [phone]) [phone]  ID: Julian Medina OB: Oct 24, 1945  MR#: [id number]  VQM#:[id number]  Patient Care Team: Guadalupe Maple, MD as PCP - General (Family Medicine) Lloyd Huger, MD as Consulting Physician (Oncology) [name], MD as Consulting Physician (Urology)  CHIEF COMPLAINT: CLL  INTERVAL HISTORY: Patient returns to clinic today for further evaluation and initiation of Rituxan and Treanda. He continues to be highly anxious. He denies any recent fevers, night sweats, or weight loss. He has no neurologic complaints. He denies any pain. He has no chest pain or shortness of breath. He denies any nausea, vomiting, constipation, or diarrhea. He has no urinary complaints. Patient offers no further specific complaints today.  REVIEW OF SYSTEMS:   Review of Systems  Constitutional: Negative for diaphoresis, fever, malaise/fatigue and weight loss.  Respiratory: Negative.  Negative for cough and shortness of breath.   Cardiovascular: Negative.  Negative for chest pain and leg swelling.  Gastrointestinal: Negative.  Negative for abdominal pain.  Genitourinary: Negative.   Musculoskeletal: Negative.   Skin: Negative.  Negative for rash.  Neurological: Negative.  Negative for sensory change and weakness.  Psychiatric/Behavioral: The patient is nervous/anxious.     As per HPI. Otherwise, a complete review of systems is negative.  PAST MEDICAL HISTORY: Past Medical History:  Diagnosis Date  . Anxiety   . CLL (chronic lymphocytic leukemia) ([place])   . COPD (chronic obstructive pulmonary disease) ([place])     PAST SURGICAL HISTORY: Past Surgical History:  Procedure Laterality Date  . HERNIA REPAIR    . PERIPHERAL VASCULAR CATHETERIZATION N/A 03/14/2016   Procedure: Glori Luis Cath Insertion;  Surgeon: Algernon Huxley, MD;  Location: [facility];  Service: Cardiovascular;  Laterality: N/A;  . ROBOTIC ASSITED PARTIAL  NEPHRECTOMY Left 11/14/2015   Procedure: ROBOTIC ASSITED PARTIAL NEPHRECTOMY with intraoperative drop in ultrasound / Extensive Lysis of adhesions;  Surgeon: [name], MD;  Location: [facility];  Service: Urology;  Laterality: Left;    FAMILY HISTORY: Heart disease and hypertension.     ADVANCED DIRECTIVES:    HEALTH MAINTENANCE: Social History  Substance Use Topics  . Smoking status: Current Every Day Smoker    Packs/day: 1.50    Years: 40.00    Types: Cigarettes  . Smokeless tobacco: Never Used  . Alcohol use 4.2 oz/week    7 Cans of beer per week     Comment: occasional     Colonoscopy:  PAP:  Bone density:  Lipid panel:  Allergies  Allergen Reactions  . Aspirin Other (See Comments)    Ulcer   . Hydrochlorothiazide Itching    Current Outpatient Prescriptions  Medication Sig Dispense Refill  . ADVAIR DISKUS 250-50 MCG/DOSE AEPB INHALE 1 PUFF 2 TIMES A DAY 180 each 3  . albuterol (PROVENTIL HFA;VENTOLIN HFA) 108 (90 Base) MCG/ACT inhaler Inhale 2 puffs into the lungs every 6 (six) hours as needed for wheezing or shortness of breath. 1 Inhaler 6  . benazepril (LOTENSIN) 40 MG tablet 1 TABLET ONCE EACH DAY ORAL 90 tablet 4  . LORazepam (ATIVAN) 1 MG tablet Take 1 tablet (1 mg total) by mouth daily as needed for anxiety. 30 tablet 0  . metoprolol succinate (TOPROL-XL) 25 MG 24 hr tablet Take 1 tablet (25 mg total) by mouth daily. 90 tablet 1   No current facility-administered medications for this visit.     OBJECTIVE: Vitals:   02/27/17 0934  BP: 128/80  Pulse: 61  Resp:  18  Temp: (!) 97.5 F (36.4 C)     Body mass index is 18.37 kg/m.    ECOG FS:0 - Asymptomatic  General: Well-developed, well-nourished, no acute distress. Eyes: Pink conjunctiva, anicteric sclera. Lungs: Clear to auscultation bilaterally. Heart: Regular rate and rhythm. No rubs, murmurs, or gallops. Abdomen: Soft, nontender, nondistended. No organomegaly noted, normoactive bowel  sounds. Musculoskeletal: No edema, cyanosis, or clubbing. Neuro: Alert, answering all questions appropriately. Cranial nerves grossly intact. Skin: No rashes or petechiae noted. Psych: Normal affect. Lymphatics: Easily palpable bilateral axillary lymphadenopathy.  LAB RESULTS:  Lab Results  Component Value Date   NA 136 02/27/2017   K 4.8 02/27/2017   CL 106 02/27/2017   CO2 24 02/27/2017   GLUCOSE 95 02/27/2017   BUN 18 02/27/2017   CREATININE 1.56 (H) 02/27/2017   CALCIUM 9.6 02/27/2017   PROT 7.0 02/27/2017   ALBUMIN 4.5 02/27/2017   AST 23 02/27/2017   ALT 17 02/27/2017   ALKPHOS 60 02/27/2017   BILITOT 0.7 02/27/2017   GFRNONAA 44 (L) 02/27/2017   GFRAA 51 (L) 02/27/2017    Lab Results  Component Value Date   WBC 13.5 (H) 02/27/2017   NEUTROABS 8.0 (H) 02/27/2017   HGB 12.5 (L) 02/27/2017   HCT 36.3 (L) 02/27/2017   MCV 88.5 02/27/2017   PLT 251 02/27/2017     STUDIES: Ct Soft Tissue Neck W Contrast  Result Date: 02/17/2017 CLINICAL DATA:  Chronic lymphocytic leukemia.  Restaging. EXAM: CT NECK WITH CONTRAST TECHNIQUE: Multidetector CT imaging of the neck was performed using the standard protocol following the bolus administration of intravenous contrast. CONTRAST:  50mL ISOVUE-300 IOPAMIDOL (ISOVUE-300) INJECTION 61% COMPARISON:  CT neck March 05, 2016 FINDINGS: PHARYNX AND LARYNX: Normal.  Widely patent airway. SALIVARY GLANDS: Normal. THYROID: Normal. LYMPH NODES: Improved multilevel lymphadenopathy. LEFT level IIb lymph node was 16 mm short access, now 11 mm. LEFT level 3 lymph node was 17 mm, not 10 mm. RIGHT supraclavicular 6 mm lymph node was 10 mm. VASCULAR: Atherosclerosis resulting in potential cyst in stenosis LEFT internal carotid artery origin. LIMITED INTRACRANIAL: Nonacute. Moderate calcific atherosclerosis carotid bifurcations. VISUALIZED ORBITS: Normal. MASTOIDS AND VISUALIZED PARANASAL SINUSES: Well-aerated. SKELETON: Nonacute. Severe C3-4 through  C6-7 degenerative discs. Patient is edentulous. UPPER CHEST: Axillary lymphadenopathy. Please see dedicated CT chest from same day, reported separately. OTHER: Stable LEFT cerebral sebaceous cyst. Resolution of LEFT auricle cyst. RIGHT chest Port-A-Cath. IMPRESSION: 1. Decreased cervical lymphadenopathy consistent with treatment response. 2. No acute process in the neck. Electronically Signed   By: Elon Alas M.D.   On: 02/17/2017 18:37   Ct Chest W Contrast  Result Date: 02/18/2017 CLINICAL DATA:  Chronic lymphocytic leukemia.  Restaging.  COPD. EXAM: CT CHEST, ABDOMEN, AND PELVIS WITH CONTRAST TECHNIQUE: Multidetector CT imaging of the chest, abdomen and pelvis was performed following the standard protocol during bolus administration of intravenous contrast. CONTRAST:  71mL ISOVUE-300 IOPAMIDOL (ISOVUE-300) INJECTION 61% COMPARISON:  10/29/2016 FINDINGS: CT CHEST FINDINGS Cardiovascular: Right-sided Port-A-Cath which terminates at the low SVC. Aortic and branch vessel atherosclerosis. Normal heart size, without pericardial effusion. Multivessel coronary artery atherosclerosis. No central pulmonary embolism, on this non-dedicated study. Mediastinum/Nodes: Progressive bilateral axillary adenopathy. Index left axillary node measures 2.8 cm on image 15/ series 2 versus 1.6 cm on the prior exam Precarinal node measures 11 mm on image 28/series 2 versus 7 mm on the prior. Right hilar adenopathy at 3.1 x 3.3 cm on image 32/series 2. Compare 2.2 x 2.8 cm on the  prior exam (when remeasured). Lungs/Pleura: No pleural fluid. Mild to moderate centrilobular emphysema. Right apical ground-glass nodule measures 7 mm on image 27/series 4 and is not significantly changed. There is also vague sub solid left lower lobe pulmonary nodule on the order of 7 mm on image 50/series 4. This is not significantly changed. The previously described left upper lobe 5 mm nodule has resolved. Musculoskeletal: No acute osseous  abnormality. CT ABDOMEN PELVIS FINDINGS Hepatobiliary: Normal liver. Normal gallbladder, without biliary ductal dilatation. Pancreas: Normal, without mass or ductal dilatation. Spleen: Normal in size, without focal abnormality. Adrenals/Urinary Tract: Normal adrenal glands. Mild renal cortical thinning bilaterally. Bilateral too small to characterize renal lesions. No hydronephrosis. Improved to resolved bladder wall thickening. Stomach/Bowel: Surgeon changes about the gastroesophageal junction. Normal colon. Small bowel is again positioned at the entrance to the left inguinal canal, including on image 117/series 2. Vascular/Lymphatic: Aortic and branch vessel atherosclerosis. Progressive retroperitoneal adenopathy. Index left periaortic node measures 2.5 cm on image 79/series 2 versus 1.0 cm on the prior. Porta hepatis node measures 2.7 cm on image 62/series 2 versus 1.7 cm previously Right external iliac node measures 3.6 x 6.2 cm on image 107/series 2. Compare 3.4 x 4.2 cm on the prior exam (when remeasured). Left external iliac index node measures 3.1 cm on image 107/series 2 versus 1.7 cm on the prior. Reproductive: Normal prostate. Other: No significant free fluid.  Prior midline laparotomy. Musculoskeletal: No acute osseous abnormality. Disc bulges at L5-S1 and less so L4-5. Convex right lumbar spine curvature. IMPRESSION: 1. Significant progression of adenopathy within the chest, abdomen, and pelvis. 2. Resolution of previously described left upper lobe pulmonary nodule. There are bilateral ground-glass nodules which are similar and warrant followup attention. 3. Coronary artery atherosclerosis. Aortic Atherosclerosis (ICD10-I70.0). 4.  Emphysema (ICD10-J43.9). 5. Improvement to resolution of bladder wall thickening. 6. Small bowel positioned at the entrance to the left inguinal canal. Similar. Electronically Signed   By: Abigail Miyamoto M.D.   On: 02/18/2017 08:52   Ct Abdomen Pelvis W Contrast  Result  Date: 02/18/2017 CLINICAL DATA:  Chronic lymphocytic leukemia.  Restaging.  COPD. EXAM: CT CHEST, ABDOMEN, AND PELVIS WITH CONTRAST TECHNIQUE: Multidetector CT imaging of the chest, abdomen and pelvis was performed following the standard protocol during bolus administration of intravenous contrast. CONTRAST:  16mL ISOVUE-300 IOPAMIDOL (ISOVUE-300) INJECTION 61% COMPARISON:  10/29/2016 FINDINGS: CT CHEST FINDINGS Cardiovascular: Right-sided Port-A-Cath which terminates at the low SVC. Aortic and branch vessel atherosclerosis. Normal heart size, without pericardial effusion. Multivessel coronary artery atherosclerosis. No central pulmonary embolism, on this non-dedicated study. Mediastinum/Nodes: Progressive bilateral axillary adenopathy. Index left axillary node measures 2.8 cm on image 15/ series 2 versus 1.6 cm on the prior exam Precarinal node measures 11 mm on image 28/series 2 versus 7 mm on the prior. Right hilar adenopathy at 3.1 x 3.3 cm on image 32/series 2. Compare 2.2 x 2.8 cm on the prior exam (when remeasured). Lungs/Pleura: No pleural fluid. Mild to moderate centrilobular emphysema. Right apical ground-glass nodule measures 7 mm on image 27/series 4 and is not significantly changed. There is also vague sub solid left lower lobe pulmonary nodule on the order of 7 mm on image 50/series 4. This is not significantly changed. The previously described left upper lobe 5 mm nodule has resolved. Musculoskeletal: No acute osseous abnormality. CT ABDOMEN PELVIS FINDINGS Hepatobiliary: Normal liver. Normal gallbladder, without biliary ductal dilatation. Pancreas: Normal, without mass or ductal dilatation. Spleen: Normal in size, without focal abnormality. Adrenals/Urinary Tract:  Normal adrenal glands. Mild renal cortical thinning bilaterally. Bilateral too small to characterize renal lesions. No hydronephrosis. Improved to resolved bladder wall thickening. Stomach/Bowel: Surgeon changes about the gastroesophageal  junction. Normal colon. Small bowel is again positioned at the entrance to the left inguinal canal, including on image 117/series 2. Vascular/Lymphatic: Aortic and branch vessel atherosclerosis. Progressive retroperitoneal adenopathy. Index left periaortic node measures 2.5 cm on image 79/series 2 versus 1.0 cm on the prior. Porta hepatis node measures 2.7 cm on image 62/series 2 versus 1.7 cm previously Right external iliac node measures 3.6 x 6.2 cm on image 107/series 2. Compare 3.4 x 4.2 cm on the prior exam (when remeasured). Left external iliac index node measures 3.1 cm on image 107/series 2 versus 1.7 cm on the prior. Reproductive: Normal prostate. Other: No significant free fluid.  Prior midline laparotomy. Musculoskeletal: No acute osseous abnormality. Disc bulges at L5-S1 and less so L4-5. Convex right lumbar spine curvature. IMPRESSION: 1. Significant progression of adenopathy within the chest, abdomen, and pelvis. 2. Resolution of previously described left upper lobe pulmonary nodule. There are bilateral ground-glass nodules which are similar and warrant followup attention. 3. Coronary artery atherosclerosis. Aortic Atherosclerosis (ICD10-I70.0). 4.  Emphysema (ICD10-J43.9). 5. Improvement to resolution of bladder wall thickening. 6. Small bowel positioned at the entrance to the left inguinal canal. Similar. Electronically Signed   By: Abigail Miyamoto M.D.   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   PLAN:    1.  CLL: Flow cytometry confirmed the diagnosis.  Patient is also ZAP-70 positive this which indicates it may be a more aggressive form of CLL. CT scan results reviewed independently and reported as above with progressive disease in patients chest, abdomen, and pelvis. His white blood cell count only remained mildly elevated. After lengthy discussion, he wishes to reinitiate chemotherapy using Treanda and  Rituxan. Plan on 3-4 cycles every 28 days and then reimage. Patient also expressed understanding that he may require more aggressive treatment possibly using FCR in the future. Proceed with cycle 1, day 1 today. Return to clinic tomorrow for [place] only and then in 4 weeks for consideration of cycle 2.  He most recently received treatment with Rituxan and Treanda on November 2&3, 2017.   2.  Anxiety: Continue Xanax PRN. 3.  Renal cell carcinoma: Patient is status post partial nephrectomy on Nov 14, 2015. Continued follow-up per urology. CT results as above.  4.  Renal insufficiency: Patient's creatinine slightly elevated today, monitor.   Patient expressed understanding and was in agreement with this plan. He also understands that He can call clinic at any time with any questions, concerns, or complaints.    Lloyd Huger, MD   02/28/2017 12:30 PM

## 2017-02-27 ENCOUNTER — Inpatient Hospital Stay: Payer: 59

## 2017-02-27 ENCOUNTER — Inpatient Hospital Stay (HOSPITAL_BASED_OUTPATIENT_CLINIC_OR_DEPARTMENT_OTHER): Payer: 59 | Admitting: Oncology

## 2017-02-27 VITALS — BP 115/65 | HR 74 | Resp 18

## 2017-02-27 VITALS — BP 128/80 | HR 61 | Temp 97.5°F | Resp 18 | Wt 127.1 lb

## 2017-02-27 DIAGNOSIS — F17210 Nicotine dependence, cigarettes, uncomplicated: Secondary | ICD-10-CM | POA: Diagnosis not present

## 2017-02-27 DIAGNOSIS — F419 Anxiety disorder, unspecified: Secondary | ICD-10-CM | POA: Diagnosis not present

## 2017-02-27 DIAGNOSIS — I700 Atherosclerosis of aorta: Secondary | ICD-10-CM

## 2017-02-27 DIAGNOSIS — R590 Localized enlarged lymph nodes: Secondary | ICD-10-CM

## 2017-02-27 DIAGNOSIS — J449 Chronic obstructive pulmonary disease, unspecified: Secondary | ICD-10-CM | POA: Diagnosis not present

## 2017-02-27 DIAGNOSIS — C9110 Chronic lymphocytic leukemia of B-cell type not having achieved remission: Secondary | ICD-10-CM

## 2017-02-27 DIAGNOSIS — C642 Malignant neoplasm of left kidney, except renal pelvis: Secondary | ICD-10-CM

## 2017-02-27 DIAGNOSIS — L723 Sebaceous cyst: Secondary | ICD-10-CM

## 2017-02-27 DIAGNOSIS — M5126 Other intervertebral disc displacement, lumbar region: Secondary | ICD-10-CM

## 2017-02-27 DIAGNOSIS — Z905 Acquired absence of kidney: Secondary | ICD-10-CM | POA: Diagnosis not present

## 2017-02-27 DIAGNOSIS — N289 Disorder of kidney and ureter, unspecified: Secondary | ICD-10-CM | POA: Diagnosis not present

## 2017-02-27 LAB — CBC WITH DIFFERENTIAL/PLATELET
BASOS PCT: 2 %
Basophils Absolute: 0.3 10*3/uL — ABNORMAL HIGH (ref 0–0.1)
EOS PCT: 6 %
Eosinophils Absolute: 0.8 10*3/uL — ABNORMAL HIGH (ref 0–0.7)
HEMATOCRIT: 36.3 % — AB (ref 40.0–52.0)
HEMOGLOBIN: 12.5 g/dL — AB (ref 13.0–18.0)
LYMPHS ABS: 3.6 10*3/uL (ref 1.0–3.6)
Lymphocytes Relative: 27 %
MCH: 30.4 pg (ref 26.0–34.0)
MCHC: 34.3 g/dL (ref 32.0–36.0)
MCV: 88.5 fL (ref 80.0–100.0)
MONO ABS: 0.8 10*3/uL (ref 0.2–1.0)
MONOS PCT: 6 %
Neutro Abs: 8 10*3/uL — ABNORMAL HIGH (ref 1.4–6.5)
Neutrophils Relative %: 59 %
Platelets: 251 10*3/uL (ref 150–440)
RBC: 4.1 MIL/uL — AB (ref 4.40–5.90)
RDW: 17.3 % — ABNORMAL HIGH (ref 11.5–14.5)
SMEAR REVIEW: ADEQUATE
WBC: 13.5 10*3/uL — ABNORMAL HIGH (ref 3.8–10.6)

## 2017-02-27 LAB — COMPREHENSIVE METABOLIC PANEL
ALK PHOS: 60 U/L (ref 38–126)
ALT: 17 U/L (ref 17–63)
ANION GAP: 6 (ref 5–15)
AST: 23 U/L (ref 15–41)
Albumin: 4.5 g/dL (ref 3.5–5.0)
BILIRUBIN TOTAL: 0.7 mg/dL (ref 0.3–1.2)
BUN: 18 mg/dL (ref 6–20)
CALCIUM: 9.6 mg/dL (ref 8.9–10.3)
CO2: 24 mmol/L (ref 22–32)
Chloride: 106 mmol/L (ref 101–111)
Creatinine, Ser: 1.56 mg/dL — ABNORMAL HIGH (ref 0.61–1.24)
GFR, EST AFRICAN AMERICAN: 51 mL/min — AB (ref >60)
GFR, EST NON AFRICAN AMERICAN: 44 mL/min — AB (ref >60)
Glucose, Bld: 95 mg/dL (ref 65–99)
Potassium: 4.8 mmol/L (ref 3.5–5.1)
Sodium: 136 mmol/L (ref 135–145)
TOTAL PROTEIN: 7 g/dL (ref 6.5–8.1)

## 2017-02-27 MED ORDER — PALONOSETRON HCL INJECTION 0.25 MG/5ML
0.2500 mg | Freq: Once | INTRAVENOUS | Status: AC
  Administered 2017-02-27: 0.25 mg via INTRAVENOUS

## 2017-02-27 MED ORDER — DEXAMETHASONE SODIUM PHOSPHATE 10 MG/ML IJ SOLN
10.0000 mg | Freq: Once | INTRAMUSCULAR | Status: AC
  Administered 2017-02-27: 10 mg via INTRAVENOUS

## 2017-02-27 MED ORDER — SODIUM CHLORIDE 0.9 % IV SOLN
Freq: Once | INTRAVENOUS | Status: AC
  Administered 2017-02-27: 11:00:00 via INTRAVENOUS
  Filled 2017-02-27: qty 1000

## 2017-02-27 MED ORDER — BENDAMUSTINE HCL CHEMO INJECTION 100 MG/4ML
90.0000 mg/m2 | Freq: Once | INTRAVENOUS | Status: AC
  Administered 2017-02-27: 150 mg via INTRAVENOUS
  Filled 2017-02-27: qty 6

## 2017-02-27 MED ORDER — ACETAMINOPHEN 325 MG PO TABS
650.0000 mg | ORAL_TABLET | Freq: Once | ORAL | Status: AC
  Administered 2017-02-27: 650 mg via ORAL

## 2017-02-27 MED ORDER — DIPHENHYDRAMINE HCL 25 MG PO CAPS
25.0000 mg | ORAL_CAPSULE | Freq: Once | ORAL | Status: AC
  Administered 2017-02-27: 25 mg via ORAL

## 2017-02-27 MED ORDER — RITUXIMAB CHEMO INJECTION 500 MG/50ML
375.0000 mg/m2 | Freq: Once | INTRAVENOUS | Status: AC
  Administered 2017-02-27: 600 mg via INTRAVENOUS
  Filled 2017-02-27: qty 50

## 2017-02-27 MED ORDER — SODIUM CHLORIDE 0.9% FLUSH
10.0000 mL | INTRAVENOUS | Status: DC | PRN
  Administered 2017-02-27: 10 mL via INTRAVENOUS
  Filled 2017-02-27: qty 10

## 2017-02-27 MED ORDER — HEPARIN SOD (PORK) LOCK FLUSH 100 UNIT/ML IV SOLN
500.0000 [IU] | Freq: Once | INTRAVENOUS | Status: AC
  Administered 2017-02-27: 500 [IU] via INTRAVENOUS
  Filled 2017-02-27: qty 5

## 2017-02-27 NOTE — Progress Notes (Signed)
Patient is here for follow up, he is doing well no complaints  

## 2017-02-27 NOTE — Progress Notes (Signed)
Creatinine: 1.56. MD, Dr. Grayland Ormond, notified via telephone. Per MD order: proceed with treatment as scheduled.

## 2017-02-28 ENCOUNTER — Inpatient Hospital Stay: Payer: 59

## 2017-02-28 VITALS — BP 143/77 | HR 61 | Temp 97.4°F | Resp 18

## 2017-02-28 DIAGNOSIS — C9110 Chronic lymphocytic leukemia of B-cell type not having achieved remission: Secondary | ICD-10-CM

## 2017-02-28 MED ORDER — SODIUM CHLORIDE 0.9 % IV SOLN
Freq: Once | INTRAVENOUS | Status: AC
  Administered 2017-02-28: 10:00:00 via INTRAVENOUS
  Filled 2017-02-28: qty 1000

## 2017-02-28 MED ORDER — DEXAMETHASONE SODIUM PHOSPHATE 10 MG/ML IJ SOLN
10.0000 mg | Freq: Once | INTRAMUSCULAR | Status: AC
  Administered 2017-02-28: 10 mg via INTRAVENOUS
  Filled 2017-02-28: qty 1

## 2017-02-28 MED ORDER — HEPARIN SOD (PORK) LOCK FLUSH 100 UNIT/ML IV SOLN
500.0000 [IU] | Freq: Once | INTRAVENOUS | Status: AC | PRN
  Administered 2017-02-28: 500 [IU]
  Filled 2017-02-28: qty 5

## 2017-02-28 MED ORDER — SODIUM CHLORIDE 0.9 % IV SOLN
90.0000 mg/m2 | Freq: Once | INTRAVENOUS | Status: AC
  Administered 2017-02-28: 150 mg via INTRAVENOUS
  Filled 2017-02-28: qty 6

## 2017-03-26 NOTE — Progress Notes (Signed)
[facility]  Telephone:(336) [phone] [phone]) [phone]  ID: MOSI HANNOLD OB: 1945-07-19  MR#: [id number]  UYQ#:[id number]  Patient Care Team: Guadalupe Maple, MD as PCP - General (Family Medicine) Lloyd Huger, MD as Consulting Physician (Oncology) [name], MD as Consulting Physician (Urology)  CHIEF COMPLAINT: CLL  INTERVAL HISTORY: Patient returns to clinic today for further evaluation and consideration of cycle 2 of Rituxan and Treanda. He continues to be highly anxious. His lymphadenopathy is now nearly resolved. He denies any recent fevers, night sweats, or weight loss. He has no neurologic complaints. He denies any pain. He has no chest pain or shortness of breath. He denies any nausea, vomiting, constipation, or diarrhea. He has no urinary complaints. Patient offers no further specific complaints today.  REVIEW OF SYSTEMS:   Review of Systems  Constitutional: Negative for diaphoresis, fever, malaise/fatigue and weight loss.  Respiratory: Negative.  Negative for cough and shortness of breath.   Cardiovascular: Negative.  Negative for chest pain and leg swelling.  Gastrointestinal: Negative.  Negative for abdominal pain.  Genitourinary: Negative.   Musculoskeletal: Negative.   Skin: Negative.  Negative for rash.  Neurological: Negative.  Negative for sensory change and weakness.  Psychiatric/Behavioral: The patient is nervous/anxious.     As per HPI. Otherwise, a complete review of systems is negative.  PAST MEDICAL HISTORY: Past Medical History:  Diagnosis Date  . Anxiety   . CLL (chronic lymphocytic leukemia) ([place])   . COPD (chronic obstructive pulmonary disease) ([place])     PAST SURGICAL HISTORY: Past Surgical History:  Procedure Laterality Date  . HERNIA REPAIR    . PERIPHERAL VASCULAR CATHETERIZATION N/A 03/14/2016   Procedure: Glori Luis Cath Insertion;  Surgeon: Algernon Huxley, MD;  Location: [facility];  Service:  Cardiovascular;  Laterality: N/A;  . ROBOTIC ASSITED PARTIAL NEPHRECTOMY Left 11/14/2015   Procedure: ROBOTIC ASSITED PARTIAL NEPHRECTOMY with intraoperative drop in ultrasound / Extensive Lysis of adhesions;  Surgeon: [name], MD;  Location: [facility];  Service: Urology;  Laterality: Left;    FAMILY HISTORY: Heart disease and hypertension.     ADVANCED DIRECTIVES:    HEALTH MAINTENANCE: Social History  Substance Use Topics  . Smoking status: Current Every Day Smoker    Packs/day: 1.50    Years: 40.00    Types: Cigarettes  . Smokeless tobacco: Never Used  . Alcohol use 4.2 oz/week    7 Cans of beer per week     Comment: occasional     Colonoscopy:  PAP:  Bone density:  Lipid panel:  Allergies  Allergen Reactions  . Aspirin Other (See Comments)    Ulcer   . Hydrochlorothiazide Itching    Current Outpatient Prescriptions  Medication Sig Dispense Refill  . ADVAIR DISKUS 250-50 MCG/DOSE AEPB INHALE 1 PUFF 2 TIMES A DAY 180 each 3  . albuterol (PROVENTIL HFA;VENTOLIN HFA) 108 (90 Base) MCG/ACT inhaler Inhale 2 puffs into the lungs every 6 (six) hours as needed for wheezing or shortness of breath. 1 Inhaler 6  . benazepril (LOTENSIN) 40 MG tablet 1 TABLET ONCE EACH DAY ORAL 90 tablet 4  . LORazepam (ATIVAN) 1 MG tablet Take 1 tablet (1 mg total) by mouth daily as needed for anxiety. 30 tablet 0  . metoprolol succinate (TOPROL-XL) 25 MG 24 hr tablet Take 1 tablet (25 mg total) by mouth daily. 90 tablet 1   No current facility-administered medications for this visit.     OBJECTIVE: Vitals:   03/27/17  1007  BP: 113/75  Pulse: 66  Resp: 18  Temp: (!) 97.5 F (36.4 C)     Body mass index is 18.01 kg/m.    ECOG FS:0 - Asymptomatic  General: Well-developed, well-nourished, no acute distress. Eyes: Pink conjunctiva, anicteric sclera. Lungs: Clear to auscultation bilaterally. Heart: Regular rate and rhythm. No rubs, murmurs, or gallops. Abdomen: Soft, nontender,  nondistended. No organomegaly noted, normoactive bowel sounds. Musculoskeletal: No edema, cyanosis, or clubbing. Neuro: Alert, answering all questions appropriately. Cranial nerves grossly intact. Skin: No rashes or petechiae noted. Psych: Normal affect. Lymphatics: Lymphadenopathy resolved.  LAB RESULTS:  Lab Results  Component Value Date   NA 135 03/27/2017   K 4.5 03/27/2017   CL 101 03/27/2017   CO2 23 03/27/2017   GLUCOSE 92 03/27/2017   BUN 20 03/27/2017   CREATININE 1.79 (H) 03/27/2017   CALCIUM 9.8 03/27/2017   PROT 7.2 03/27/2017   ALBUMIN 4.6 03/27/2017   AST 23 03/27/2017   ALT 13 (L) 03/27/2017   ALKPHOS 60 03/27/2017   BILITOT 0.6 03/27/2017   GFRNONAA 37 (L) 03/27/2017   GFRAA 43 (L) 03/27/2017    Lab Results  Component Value Date   WBC 9.8 03/27/2017   NEUTROABS 6.6 (H) 03/27/2017   HGB 13.6 03/27/2017   HCT 39.0 (L) 03/27/2017   MCV 87.0 03/27/2017   PLT 166 03/27/2017     STUDIES: No results found.  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   PLAN:    1.  CLL: Flow cytometry confirmed the diagnosis.  Patient is also ZAP-70 positive this which indicates it may be a more aggressive form of CLL. CT scan results reviewed independently and reported as above with progressive disease in patients chest, abdomen, and pelvis. His white blood cell count is now within normal limits.wishes to reinitiate chemotherapy using Treanda and Rituxan. Plan on 3-4 cycles every 28 days and then reimage. Patient also expressed understanding that he may require more aggressive treatment possibly using FCR in the future. Proceed with cycle 2, day 1 today. Return to clinic tomorrow for [place] only and then in 4 weeks for consideration of cycle 3.  He most recently received treatment with Rituxan and Treanda on November 2&3, 2017.   2.  Anxiety: Continue Xanax PRN. 3.  Renal cell carcinoma: Patient is status post  partial nephrectomy on Nov 14, 2015. Continued follow-up per urology. CT results as above.  4.  Renal insufficiency: Patient's creatinine slightly elevated today, monitor.   Patient expressed understanding and was in agreement with this plan. He also understands that He can call clinic at any time with any questions, concerns, or complaints.    Lloyd Huger, MD   03/28/2017 2:36 PM

## 2017-03-27 ENCOUNTER — Inpatient Hospital Stay: Payer: 59

## 2017-03-27 ENCOUNTER — Inpatient Hospital Stay: Payer: 59 | Attending: Oncology

## 2017-03-27 ENCOUNTER — Inpatient Hospital Stay (HOSPITAL_BASED_OUTPATIENT_CLINIC_OR_DEPARTMENT_OTHER): Payer: 59 | Admitting: Oncology

## 2017-03-27 VITALS — BP 113/75 | HR 66 | Temp 97.5°F | Resp 18 | Wt 124.6 lb

## 2017-03-27 DIAGNOSIS — J449 Chronic obstructive pulmonary disease, unspecified: Secondary | ICD-10-CM | POA: Diagnosis not present

## 2017-03-27 DIAGNOSIS — Z5112 Encounter for antineoplastic immunotherapy: Secondary | ICD-10-CM | POA: Insufficient documentation

## 2017-03-27 DIAGNOSIS — C9110 Chronic lymphocytic leukemia of B-cell type not having achieved remission: Secondary | ICD-10-CM

## 2017-03-27 DIAGNOSIS — Z85528 Personal history of other malignant neoplasm of kidney: Secondary | ICD-10-CM

## 2017-03-27 DIAGNOSIS — N2889 Other specified disorders of kidney and ureter: Secondary | ICD-10-CM

## 2017-03-27 DIAGNOSIS — F17210 Nicotine dependence, cigarettes, uncomplicated: Secondary | ICD-10-CM | POA: Insufficient documentation

## 2017-03-27 DIAGNOSIS — C9190 Lymphoid leukemia, unspecified not having achieved remission: Secondary | ICD-10-CM | POA: Diagnosis present

## 2017-03-27 DIAGNOSIS — F419 Anxiety disorder, unspecified: Secondary | ICD-10-CM | POA: Diagnosis not present

## 2017-03-27 LAB — CBC WITH DIFFERENTIAL/PLATELET
BASOS ABS: 0.1 10*3/uL (ref 0–0.1)
Basophils Relative: 1 %
EOS ABS: 0.8 10*3/uL — AB (ref 0–0.7)
EOS PCT: 8 %
HCT: 39 % — ABNORMAL LOW (ref 40.0–52.0)
Hemoglobin: 13.6 g/dL (ref 13.0–18.0)
LYMPHS ABS: 1.6 10*3/uL (ref 1.0–3.6)
Lymphocytes Relative: 16 %
MCH: 30.4 pg (ref 26.0–34.0)
MCHC: 35 g/dL (ref 32.0–36.0)
MCV: 87 fL (ref 80.0–100.0)
MONO ABS: 0.7 10*3/uL (ref 0.2–1.0)
Monocytes Relative: 7 %
NEUTROS PCT: 68 %
Neutro Abs: 6.6 10*3/uL — ABNORMAL HIGH (ref 1.4–6.5)
PLATELETS: 166 10*3/uL (ref 150–440)
RBC: 4.48 MIL/uL (ref 4.40–5.90)
RDW: 16.1 % — ABNORMAL HIGH (ref 11.5–14.5)
WBC: 9.8 10*3/uL (ref 3.8–10.6)

## 2017-03-27 LAB — COMPREHENSIVE METABOLIC PANEL
ALT: 13 U/L — ABNORMAL LOW (ref 17–63)
AST: 23 U/L (ref 15–41)
Albumin: 4.6 g/dL (ref 3.5–5.0)
Alkaline Phosphatase: 60 U/L (ref 38–126)
Anion gap: 11 (ref 5–15)
BUN: 20 mg/dL (ref 6–20)
CHLORIDE: 101 mmol/L (ref 101–111)
CO2: 23 mmol/L (ref 22–32)
Calcium: 9.8 mg/dL (ref 8.9–10.3)
Creatinine, Ser: 1.79 mg/dL — ABNORMAL HIGH (ref 0.61–1.24)
GFR calc Af Amer: 43 mL/min — ABNORMAL LOW (ref >60)
GFR, EST NON AFRICAN AMERICAN: 37 mL/min — AB (ref >60)
Glucose, Bld: 92 mg/dL (ref 65–99)
POTASSIUM: 4.5 mmol/L (ref 3.5–5.1)
SODIUM: 135 mmol/L (ref 135–145)
Total Bilirubin: 0.6 mg/dL (ref 0.3–1.2)
Total Protein: 7.2 g/dL (ref 6.5–8.1)

## 2017-03-27 MED ORDER — SODIUM CHLORIDE 0.9 % IV SOLN
600.0000 mg | Freq: Once | INTRAVENOUS | Status: AC
  Administered 2017-03-27: 600 mg via INTRAVENOUS
  Filled 2017-03-27: qty 10

## 2017-03-27 MED ORDER — DEXAMETHASONE SODIUM PHOSPHATE 10 MG/ML IJ SOLN
10.0000 mg | Freq: Once | INTRAMUSCULAR | Status: AC
  Administered 2017-03-27: 10 mg via INTRAVENOUS
  Filled 2017-03-27: qty 1

## 2017-03-27 MED ORDER — DIPHENHYDRAMINE HCL 25 MG PO CAPS
25.0000 mg | ORAL_CAPSULE | Freq: Once | ORAL | Status: AC
  Administered 2017-03-27: 25 mg via ORAL
  Filled 2017-03-27: qty 1

## 2017-03-27 MED ORDER — SODIUM CHLORIDE 0.9 % IV SOLN
Freq: Once | INTRAVENOUS | Status: AC
  Administered 2017-03-27: 11:00:00 via INTRAVENOUS
  Filled 2017-03-27: qty 1000

## 2017-03-27 MED ORDER — HEPARIN SOD (PORK) LOCK FLUSH 100 UNIT/ML IV SOLN
500.0000 [IU] | Freq: Once | INTRAVENOUS | Status: AC
  Administered 2017-03-27: 500 [IU] via INTRAVENOUS

## 2017-03-27 MED ORDER — SODIUM CHLORIDE 0.9 % IV SOLN: 375.0000 mg/m2 | Freq: Once | INTRAVENOUS | Status: DC

## 2017-03-27 MED ORDER — ACETAMINOPHEN 325 MG PO TABS
650.0000 mg | ORAL_TABLET | Freq: Once | ORAL | Status: AC
  Administered 2017-03-27: 650 mg via ORAL
  Filled 2017-03-27: qty 2

## 2017-03-27 MED ORDER — SODIUM CHLORIDE 0.9 % IV SOLN
90.0000 mg/m2 | Freq: Once | INTRAVENOUS | Status: AC
  Administered 2017-03-27: 150 mg via INTRAVENOUS
  Filled 2017-03-27: qty 6

## 2017-03-27 MED ORDER — PALONOSETRON HCL INJECTION 0.25 MG/5ML
0.2500 mg | Freq: Once | INTRAVENOUS | Status: AC
  Administered 2017-03-27: 0.25 mg via INTRAVENOUS

## 2017-03-27 NOTE — Progress Notes (Signed)
Reviewed Creatine of 1.79 with Dr. Grayland Ormond, proceed with treatment today per Dr. Grayland Ormond. LJ

## 2017-03-28 ENCOUNTER — Inpatient Hospital Stay: Payer: 59

## 2017-03-28 VITALS — BP 109/69 | HR 64 | Temp 96.5°F | Resp 18

## 2017-03-28 DIAGNOSIS — C9110 Chronic lymphocytic leukemia of B-cell type not having achieved remission: Secondary | ICD-10-CM

## 2017-03-28 DIAGNOSIS — C9190 Lymphoid leukemia, unspecified not having achieved remission: Secondary | ICD-10-CM | POA: Diagnosis not present

## 2017-03-28 MED ORDER — SODIUM CHLORIDE 0.9 % IV SOLN: 10.0000 mg | Freq: Once | INTRAVENOUS | Status: DC

## 2017-03-28 MED ORDER — DEXAMETHASONE SODIUM PHOSPHATE 10 MG/ML IJ SOLN
10.0000 mg | Freq: Once | INTRAMUSCULAR | Status: AC
  Administered 2017-03-28: 10 mg via INTRAVENOUS
  Filled 2017-03-28: qty 1

## 2017-03-28 MED ORDER — BENDAMUSTINE HCL CHEMO INJECTION 100 MG/4ML
90.0000 mg/m2 | Freq: Once | INTRAVENOUS | Status: AC
  Administered 2017-03-28: 150 mg via INTRAVENOUS
  Filled 2017-03-28: qty 6

## 2017-03-28 MED ORDER — SODIUM CHLORIDE 0.9 % IV SOLN
Freq: Once | INTRAVENOUS | Status: AC
  Administered 2017-03-28: 10:00:00 via INTRAVENOUS
  Filled 2017-03-28: qty 1000

## 2017-03-28 MED ORDER — HEPARIN SOD (PORK) LOCK FLUSH 100 UNIT/ML IV SOLN
500.0000 [IU] | Freq: Once | INTRAVENOUS | Status: AC | PRN
  Administered 2017-03-28: 500 [IU]
  Filled 2017-03-28: qty 5

## 2017-04-18 ENCOUNTER — Other Ambulatory Visit: Payer: Self-pay

## 2017-04-18 DIAGNOSIS — C9110 Chronic lymphocytic leukemia of B-cell type not having achieved remission: Secondary | ICD-10-CM

## 2017-04-22 NOTE — Progress Notes (Signed)
[facility]  [phone] [phone]  ID: AMERIGO MCGLORY OB: [birth year]  MR#: [id number]  YIF#:[id number]  Patient Care Team: Guadalupe Maple, MD as PCP - General (Family Medicine) Lloyd Huger, MD as Consulting Physician (Oncology) [name], MD as Consulting Physician (Urology)  CHIEF COMPLAINT: CLL  INTERVAL HISTORY: Patient returns to clinic today for further evaluation and consideration of cycle 3 of Rituxan and Treanda. He continues to be highly anxious. His lymphadenopathy has now nearly resolved. He denies any recent fevers, night sweats, or weight loss. He has no neurologic complaints. He denies any pain. He has no chest pain or shortness of breath. He denies any nausea, vomiting, constipation, or diarrhea. He has no urinary complaints. Patient offers no further specific complaints today.  REVIEW OF SYSTEMS:   Review of Systems  Constitutional: Negative for diaphoresis, fever, malaise/fatigue and weight loss.  Respiratory: Negative.  Negative for cough and shortness of breath.   Cardiovascular: Negative.  Negative for chest pain and leg swelling.  Gastrointestinal: Negative.  Negative for abdominal pain.  Genitourinary: Negative.   Musculoskeletal: Negative.   Skin: Negative.  Negative for rash.  Neurological: Negative.  Negative for sensory change and weakness.  Psychiatric/Behavioral: The patient is nervous/anxious.     As per HPI. Otherwise, a complete review of systems is negative.  PAST MEDICAL HISTORY: Past Medical History:  Diagnosis Date  . Anxiety   . CLL (chronic lymphocytic leukemia) ([place])   . COPD (chronic obstructive pulmonary disease) ([place])     PAST SURGICAL HISTORY: Past Surgical History:  Procedure Laterality Date  . HERNIA REPAIR    . PERIPHERAL VASCULAR CATHETERIZATION N/A 03/14/2016   Procedure: Glori Luis Cath Insertion;  Surgeon: Algernon Huxley, MD;  Location: [facility];  Service:  Cardiovascular;  Laterality: N/A;  . ROBOTIC ASSITED PARTIAL NEPHRECTOMY Left 11/14/2015   Procedure: ROBOTIC ASSITED PARTIAL NEPHRECTOMY with intraoperative drop in ultrasound / Extensive Lysis of adhesions;  Surgeon: [name], MD;  Location: [facility];  Service: Urology;  Laterality: Left;    FAMILY HISTORY: Heart disease and hypertension.     ADVANCED DIRECTIVES:    HEALTH MAINTENANCE: Social History  Substance Use Topics  . Smoking status: Current Every Day Smoker    Packs/day: 1.50    Years: 40.00    Types: Cigarettes  . Smokeless tobacco: Never Used  . Alcohol use 4.2 oz/week    7 Cans of beer per week     Comment: occasional     Colonoscopy:  PAP:  Bone density:  Lipid panel:  Allergies  Allergen Reactions  . Aspirin Other (See Comments)    Ulcer   . Hydrochlorothiazide Itching    Current Outpatient Prescriptions  Medication Sig Dispense Refill  . ADVAIR DISKUS 250-50 MCG/DOSE AEPB INHALE 1 PUFF 2 TIMES A DAY 180 each 3  . albuterol (PROVENTIL HFA;VENTOLIN HFA) 108 (90 Base) MCG/ACT inhaler Inhale 2 puffs into the lungs every 6 (six) hours as needed for wheezing or shortness of breath. 1 Inhaler 6  . benazepril (LOTENSIN) 40 MG tablet 1 TABLET ONCE EACH DAY ORAL 90 tablet 4  . LORazepam (ATIVAN) 1 MG tablet Take 1 tablet (1 mg total) by mouth daily as needed for anxiety. 30 tablet 0  . metoprolol succinate (TOPROL-XL) 25 MG 24 hr tablet Take 1 tablet (25 mg total) by mouth daily. 90 tablet 1   No current facility-administered medications for this visit.     OBJECTIVE: Vitals:   04/24/17  0921  BP: 136/81  Pulse: 69  Resp: 18  Temp: 97.6 F (36.4 C)     Body mass index is 18.63 kg/m.    ECOG FS:0 - Asymptomatic  General: Well-developed, well-nourished, no acute distress. Eyes: Pink conjunctiva, anicteric sclera. Lungs: Clear to auscultation bilaterally. Heart: Regular rate and rhythm. No rubs, murmurs, or gallops. Abdomen: Soft, nontender,  nondistended. No organomegaly noted, normoactive bowel sounds. Musculoskeletal: No edema, cyanosis, or clubbing. Neuro: Alert, answering all questions appropriately. Cranial nerves grossly intact. Skin: No rashes or petechiae noted. Psych: Normal affect. Lymphatics: Lymphadenopathy resolved.  LAB RESULTS:  Lab Results  Component Value Date   NA 137 04/24/2017   K 4.7 04/24/2017   CL 104 04/24/2017   CO2 24 04/24/2017   GLUCOSE 88 04/24/2017   BUN 15 04/24/2017   CREATININE 1.63 (H) 04/24/2017   CALCIUM 9.6 04/24/2017   PROT 7.1 04/24/2017   ALBUMIN 4.5 04/24/2017   AST 21 04/24/2017   ALT 15 (L) 04/24/2017   ALKPHOS 62 04/24/2017   BILITOT 0.4 04/24/2017   GFRNONAA 41 (L) 04/24/2017   GFRAA 48 (L) 04/24/2017    Lab Results  Component Value Date   WBC 9.4 04/24/2017   NEUTROABS 6.0 04/24/2017   HGB 13.3 04/24/2017   HCT 38.8 (L) 04/24/2017   MCV 88.9 04/24/2017   PLT 170 04/24/2017     STUDIES: No results found.  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   PLAN:    1.  CLL: Flow cytometry confirmed the diagnosis. Patient is also ZAP-70 positive this which indicates it may be a more aggressive form of CLL. CT scan results from February 17, 2017 reviewed independently with progressive disease in patient's chest, abdomen, and pelvis. His white blood cell count is now within normal limits. Patient also expressed understanding that he may require more aggressive treatment possibly using FCR in the future. Proceed with cycle 3, day 1 today. Return to clinic tomorrow for [place] only and then in 3 months for repeat imaging and further evaluation.  He most recently received treatment with Rituxan and Treanda on November 2&3, 2017.   2.  Anxiety: Continue Xanax PRN. 3.  Renal cell carcinoma: Patient is status post partial nephrectomy on Nov 14, 2015. Continued follow-up per urology. CT results as above.  4.  Renal  insufficiency: Patient's creatinine slightly elevated today, monitor.   Patient expressed understanding and was in agreement with this plan. He also understands that He can call clinic at any time with any questions, concerns, or complaints.    Lloyd Huger, MD   04/26/2017 9:21 AM

## 2017-04-24 ENCOUNTER — Inpatient Hospital Stay (HOSPITAL_BASED_OUTPATIENT_CLINIC_OR_DEPARTMENT_OTHER): Payer: 59 | Admitting: Oncology

## 2017-04-24 ENCOUNTER — Inpatient Hospital Stay: Payer: 59

## 2017-04-24 ENCOUNTER — Inpatient Hospital Stay: Payer: 59 | Attending: Oncology

## 2017-04-24 VITALS — BP 136/81 | HR 69 | Temp 97.6°F | Resp 18 | Wt 128.9 lb

## 2017-04-24 DIAGNOSIS — Z79899 Other long term (current) drug therapy: Secondary | ICD-10-CM | POA: Insufficient documentation

## 2017-04-24 DIAGNOSIS — Z5111 Encounter for antineoplastic chemotherapy: Secondary | ICD-10-CM | POA: Diagnosis not present

## 2017-04-24 DIAGNOSIS — C9110 Chronic lymphocytic leukemia of B-cell type not having achieved remission: Secondary | ICD-10-CM

## 2017-04-24 DIAGNOSIS — F17210 Nicotine dependence, cigarettes, uncomplicated: Secondary | ICD-10-CM | POA: Diagnosis not present

## 2017-04-24 DIAGNOSIS — J449 Chronic obstructive pulmonary disease, unspecified: Secondary | ICD-10-CM | POA: Diagnosis not present

## 2017-04-24 DIAGNOSIS — Z85528 Personal history of other malignant neoplasm of kidney: Secondary | ICD-10-CM | POA: Insufficient documentation

## 2017-04-24 DIAGNOSIS — F419 Anxiety disorder, unspecified: Secondary | ICD-10-CM | POA: Insufficient documentation

## 2017-04-24 DIAGNOSIS — Z5112 Encounter for antineoplastic immunotherapy: Secondary | ICD-10-CM | POA: Insufficient documentation

## 2017-04-24 DIAGNOSIS — C9190 Lymphoid leukemia, unspecified not having achieved remission: Secondary | ICD-10-CM | POA: Diagnosis not present

## 2017-04-24 DIAGNOSIS — R944 Abnormal results of kidney function studies: Secondary | ICD-10-CM

## 2017-04-24 DIAGNOSIS — N2889 Other specified disorders of kidney and ureter: Secondary | ICD-10-CM | POA: Insufficient documentation

## 2017-04-24 DIAGNOSIS — I10 Essential (primary) hypertension: Secondary | ICD-10-CM

## 2017-04-24 DIAGNOSIS — R42 Dizziness and giddiness: Secondary | ICD-10-CM

## 2017-04-24 LAB — COMPREHENSIVE METABOLIC PANEL
ALT: 15 U/L — AB (ref 17–63)
AST: 21 U/L (ref 15–41)
Albumin: 4.5 g/dL (ref 3.5–5.0)
Alkaline Phosphatase: 62 U/L (ref 38–126)
Anion gap: 9 (ref 5–15)
BUN: 15 mg/dL (ref 6–20)
CHLORIDE: 104 mmol/L (ref 101–111)
CO2: 24 mmol/L (ref 22–32)
CREATININE: 1.63 mg/dL — AB (ref 0.61–1.24)
Calcium: 9.6 mg/dL (ref 8.9–10.3)
GFR calc Af Amer: 48 mL/min — ABNORMAL LOW (ref >60)
GFR, EST NON AFRICAN AMERICAN: 41 mL/min — AB (ref >60)
Glucose, Bld: 88 mg/dL (ref 65–99)
Potassium: 4.7 mmol/L (ref 3.5–5.1)
SODIUM: 137 mmol/L (ref 135–145)
Total Bilirubin: 0.4 mg/dL (ref 0.3–1.2)
Total Protein: 7.1 g/dL (ref 6.5–8.1)

## 2017-04-24 LAB — CBC WITH DIFFERENTIAL/PLATELET
Basophils Absolute: 0.3 K/uL — ABNORMAL HIGH (ref 0–0.1)
Basophils Relative: 3 %
Eosinophils Absolute: 0.5 K/uL (ref 0–0.7)
Eosinophils Relative: 5 %
HCT: 38.8 % — ABNORMAL LOW (ref 40.0–52.0)
Hemoglobin: 13.3 g/dL (ref 13.0–18.0)
Lymphocytes Relative: 19 %
Lymphs Abs: 1.8 K/uL (ref 1.0–3.6)
MCH: 30.5 pg (ref 26.0–34.0)
MCHC: 34.4 g/dL (ref 32.0–36.0)
MCV: 88.9 fL (ref 80.0–100.0)
Monocytes Absolute: 0.8 K/uL (ref 0.2–1.0)
Monocytes Relative: 9 %
Neutro Abs: 6 K/uL (ref 1.4–6.5)
Neutrophils Relative %: 64 %
Platelets: 170 K/uL (ref 150–440)
RBC: 4.37 MIL/uL — ABNORMAL LOW (ref 4.40–5.90)
RDW: 15.2 % — ABNORMAL HIGH (ref 11.5–14.5)
WBC: 9.4 K/uL (ref 3.8–10.6)

## 2017-04-24 MED ORDER — SODIUM CHLORIDE 0.9 % IV SOLN
Freq: Once | INTRAVENOUS | Status: AC
  Administered 2017-04-24: 11:00:00 via INTRAVENOUS
  Filled 2017-04-24: qty 1000

## 2017-04-24 MED ORDER — SODIUM CHLORIDE 0.9 % IV SOLN
600.0000 mg | Freq: Once | INTRAVENOUS | Status: AC
  Administered 2017-04-24: 600 mg via INTRAVENOUS
  Filled 2017-04-24: qty 50

## 2017-04-24 MED ORDER — ACETAMINOPHEN 325 MG PO TABS
650.0000 mg | ORAL_TABLET | Freq: Once | ORAL | Status: AC
  Administered 2017-04-24: 650 mg via ORAL
  Filled 2017-04-24: qty 2

## 2017-04-24 MED ORDER — DEXAMETHASONE SODIUM PHOSPHATE 10 MG/ML IJ SOLN
10.0000 mg | Freq: Once | INTRAMUSCULAR | Status: AC
  Administered 2017-04-24: 10 mg via INTRAVENOUS
  Filled 2017-04-24: qty 1

## 2017-04-24 MED ORDER — SODIUM CHLORIDE 0.9% FLUSH
10.0000 mL | Freq: Once | INTRAVENOUS | Status: AC
  Administered 2017-04-24: 10 mL via INTRAVENOUS
  Filled 2017-04-24: qty 10

## 2017-04-24 MED ORDER — HEPARIN SOD (PORK) LOCK FLUSH 100 UNIT/ML IV SOLN
500.0000 [IU] | Freq: Once | INTRAVENOUS | Status: AC
  Administered 2017-04-24: 500 [IU] via INTRAVENOUS
  Filled 2017-04-24: qty 5

## 2017-04-24 MED ORDER — SODIUM CHLORIDE 0.9 % IV SOLN: 375.0000 mg/m2 | Freq: Once | INTRAVENOUS | Status: DC

## 2017-04-24 MED ORDER — PALONOSETRON HCL INJECTION 0.25 MG/5ML
0.2500 mg | Freq: Once | INTRAVENOUS | Status: AC
  Administered 2017-04-24: 0.25 mg via INTRAVENOUS

## 2017-04-24 MED ORDER — DIPHENHYDRAMINE HCL 25 MG PO CAPS
25.0000 mg | ORAL_CAPSULE | Freq: Once | ORAL | Status: AC
  Administered 2017-04-24: 25 mg via ORAL

## 2017-04-24 MED ORDER — SODIUM CHLORIDE 0.9% FLUSH
10.0000 mL | INTRAVENOUS | Status: DC | PRN
  Administered 2017-04-24: 10 mL
  Filled 2017-04-24: qty 10

## 2017-04-24 MED ORDER — BENDAMUSTINE HCL CHEMO INJECTION 100 MG/4ML
150.0000 mg | Freq: Once | INTRAVENOUS | Status: AC
  Administered 2017-04-24: 150 mg via INTRAVENOUS
  Filled 2017-04-24: qty 6

## 2017-04-24 NOTE — Progress Notes (Signed)
Creatinine: 1.63. MD, Dr. Grayland Ormond, notified via telephone and already aware. Per MD order: proceed with scheduled treatment today.

## 2017-04-25 ENCOUNTER — Inpatient Hospital Stay: Payer: 59

## 2017-04-25 VITALS — BP 126/79 | HR 58 | Temp 95.8°F | Resp 20

## 2017-04-25 DIAGNOSIS — C9110 Chronic lymphocytic leukemia of B-cell type not having achieved remission: Secondary | ICD-10-CM

## 2017-04-25 DIAGNOSIS — Z5112 Encounter for antineoplastic immunotherapy: Secondary | ICD-10-CM | POA: Diagnosis not present

## 2017-04-25 MED ORDER — SODIUM CHLORIDE 0.9% FLUSH
10.0000 mL | INTRAVENOUS | Status: DC | PRN
  Administered 2017-04-25: 10 mL
  Filled 2017-04-25: qty 10

## 2017-04-25 MED ORDER — HEPARIN SOD (PORK) LOCK FLUSH 100 UNIT/ML IV SOLN
500.0000 [IU] | Freq: Once | INTRAVENOUS | Status: AC | PRN
  Administered 2017-04-25: 500 [IU]
  Filled 2017-04-25: qty 5

## 2017-04-25 MED ORDER — BENDAMUSTINE HCL CHEMO INJECTION 100 MG/4ML
90.0000 mg/m2 | Freq: Once | INTRAVENOUS | Status: AC
  Administered 2017-04-25: 150 mg via INTRAVENOUS
  Filled 2017-04-25: qty 6

## 2017-04-25 MED ORDER — SODIUM CHLORIDE 0.9 % IV SOLN
Freq: Once | INTRAVENOUS | Status: AC
Start: 2017-04-25 — End: 2017-04-25
  Administered 2017-04-25: 10:00:00 via INTRAVENOUS
  Filled 2017-04-25: qty 1000

## 2017-04-25 MED ORDER — DEXAMETHASONE SODIUM PHOSPHATE 10 MG/ML IJ SOLN
10.0000 mg | Freq: Once | INTRAMUSCULAR | Status: AC
  Administered 2017-04-25: 10 mg via INTRAVENOUS
  Filled 2017-04-25: qty 1

## 2017-05-13 ENCOUNTER — Other Ambulatory Visit: Payer: Self-pay | Admitting: Family Medicine

## 2017-05-13 DIAGNOSIS — J42 Unspecified chronic bronchitis: Secondary | ICD-10-CM

## 2017-05-14 ENCOUNTER — Other Ambulatory Visit: Payer: Self-pay | Admitting: Family Medicine

## 2017-05-14 DIAGNOSIS — I10 Essential (primary) hypertension: Secondary | ICD-10-CM

## 2017-06-12 ENCOUNTER — Inpatient Hospital Stay: Payer: 59 | Attending: Oncology

## 2017-06-12 ENCOUNTER — Inpatient Hospital Stay: Payer: 59

## 2017-06-12 DIAGNOSIS — Z95828 Presence of other vascular implants and grafts: Secondary | ICD-10-CM

## 2017-06-12 DIAGNOSIS — C9110 Chronic lymphocytic leukemia of B-cell type not having achieved remission: Secondary | ICD-10-CM | POA: Diagnosis present

## 2017-06-12 DIAGNOSIS — Z452 Encounter for adjustment and management of vascular access device: Secondary | ICD-10-CM | POA: Diagnosis not present

## 2017-06-12 MED ORDER — SODIUM CHLORIDE 0.9% FLUSH
10.0000 mL | INTRAVENOUS | Status: DC | PRN
  Administered 2017-06-12: 10 mL via INTRAVENOUS
  Filled 2017-06-12: qty 10

## 2017-06-12 MED ORDER — HEPARIN SOD (PORK) LOCK FLUSH 100 UNIT/ML IV SOLN
500.0000 [IU] | Freq: Once | INTRAVENOUS | Status: AC
  Administered 2017-06-12: 500 [IU] via INTRAVENOUS

## 2017-06-12 MED ORDER — HEPARIN SOD (PORK) LOCK FLUSH 100 UNIT/ML IV SOLN
INTRAVENOUS | Status: AC
  Filled 2017-06-12: qty 5

## 2017-06-18 ENCOUNTER — Ambulatory Visit: Payer: Managed Care, Other (non HMO) | Admitting: Urology

## 2017-06-26 ENCOUNTER — Other Ambulatory Visit: Payer: Self-pay | Admitting: Family Medicine

## 2017-06-26 DIAGNOSIS — I10 Essential (primary) hypertension: Secondary | ICD-10-CM

## 2017-07-22 ENCOUNTER — Telehealth: Payer: Self-pay | Admitting: Urology

## 2017-07-22 NOTE — Telephone Encounter (Signed)
That seems reasonable.  He is under the care of medical oncology who can continue to follow kidneys, would recommend annual imaging for a total of 3 years.  [name], MD

## 2017-07-22 NOTE — Telephone Encounter (Signed)
Pt called to cancel appt, he said he didn't want to reschedule until he found out how cancer appt was going to go.  Just F.Y.I.

## 2017-07-24 ENCOUNTER — Ambulatory Visit: Admission: RE | Admit: 2017-07-24 | Payer: 59 | Source: Ambulatory Visit

## 2017-07-24 ENCOUNTER — Inpatient Hospital Stay: Payer: 59 | Attending: Oncology

## 2017-07-24 ENCOUNTER — Ambulatory Visit
Admission: RE | Admit: 2017-07-24 | Discharge: 2017-07-24 | Disposition: A | Discharge status: Home / Self Care | Payer: 59 | Source: Ambulatory Visit | Attending: Oncology | Admitting: Oncology

## 2017-07-24 ENCOUNTER — Other Ambulatory Visit: Payer: Self-pay | Admitting: *Deleted

## 2017-07-24 DIAGNOSIS — I700 Atherosclerosis of aorta: Secondary | ICD-10-CM | POA: Diagnosis not present

## 2017-07-24 DIAGNOSIS — F17210 Nicotine dependence, cigarettes, uncomplicated: Secondary | ICD-10-CM | POA: Diagnosis not present

## 2017-07-24 DIAGNOSIS — Z7982 Long term (current) use of aspirin: Secondary | ICD-10-CM | POA: Diagnosis not present

## 2017-07-24 DIAGNOSIS — R918 Other nonspecific abnormal finding of lung field: Secondary | ICD-10-CM | POA: Diagnosis not present

## 2017-07-24 DIAGNOSIS — N289 Disorder of kidney and ureter, unspecified: Secondary | ICD-10-CM | POA: Diagnosis not present

## 2017-07-24 DIAGNOSIS — C9190 Lymphoid leukemia, unspecified not having achieved remission: Secondary | ICD-10-CM | POA: Insufficient documentation

## 2017-07-24 DIAGNOSIS — R590 Localized enlarged lymph nodes: Secondary | ICD-10-CM | POA: Insufficient documentation

## 2017-07-24 DIAGNOSIS — N3289 Other specified disorders of bladder: Secondary | ICD-10-CM | POA: Insufficient documentation

## 2017-07-24 DIAGNOSIS — Z452 Encounter for adjustment and management of vascular access device: Secondary | ICD-10-CM | POA: Insufficient documentation

## 2017-07-24 DIAGNOSIS — N2889 Other specified disorders of kidney and ureter: Secondary | ICD-10-CM | POA: Insufficient documentation

## 2017-07-24 DIAGNOSIS — F419 Anxiety disorder, unspecified: Secondary | ICD-10-CM | POA: Insufficient documentation

## 2017-07-24 DIAGNOSIS — C9110 Chronic lymphocytic leukemia of B-cell type not having achieved remission: Secondary | ICD-10-CM

## 2017-07-24 DIAGNOSIS — J439 Emphysema, unspecified: Secondary | ICD-10-CM | POA: Insufficient documentation

## 2017-07-24 DIAGNOSIS — Z79899 Other long term (current) drug therapy: Secondary | ICD-10-CM | POA: Insufficient documentation

## 2017-07-24 DIAGNOSIS — N281 Cyst of kidney, acquired: Secondary | ICD-10-CM | POA: Diagnosis not present

## 2017-07-24 DIAGNOSIS — C642 Malignant neoplasm of left kidney, except renal pelvis: Secondary | ICD-10-CM | POA: Insufficient documentation

## 2017-07-24 DIAGNOSIS — J449 Chronic obstructive pulmonary disease, unspecified: Secondary | ICD-10-CM | POA: Diagnosis not present

## 2017-07-24 DIAGNOSIS — Z95828 Presence of other vascular implants and grafts: Secondary | ICD-10-CM

## 2017-07-24 LAB — CBC WITH DIFFERENTIAL/PLATELET
BAND NEUTROPHILS: 0 %
BASOS PCT: 2 %
Basophils Absolute: 0.2 10*3/uL — ABNORMAL HIGH (ref 0–0.1)
Blasts: 0 %
EOS ABS: 0.5 10*3/uL (ref 0–0.7)
EOS PCT: 6 %
HCT: 38.5 % — ABNORMAL LOW (ref 40.0–52.0)
Hemoglobin: 13 g/dL (ref 13.0–18.0)
LYMPHS ABS: 1.3 10*3/uL (ref 1.0–3.6)
LYMPHS PCT: 15 %
MCH: 31.1 pg (ref 26.0–34.0)
MCHC: 33.9 g/dL (ref 32.0–36.0)
MCV: 92 fL (ref 80.0–100.0)
MONO ABS: 0.4 10*3/uL (ref 0.2–1.0)
Metamyelocytes Relative: 0 %
Monocytes Relative: 5 %
Myelocytes: 0 %
Neutro Abs: 6.4 10*3/uL (ref 1.4–6.5)
Neutrophils Relative %: 72 %
OTHER: 0 %
PLATELETS: 191 10*3/uL (ref 150–440)
PROMYELOCYTES ABS: 0 %
RBC: 4.18 MIL/uL — ABNORMAL LOW (ref 4.40–5.90)
RDW: 15.5 % — AB (ref 11.5–14.5)
WBC: 8.8 10*3/uL (ref 3.8–10.6)
nRBC: 0 /100 WBC

## 2017-07-24 LAB — POCT I-STAT CREATININE: Creatinine, Ser: 1.8 mg/dL — ABNORMAL HIGH (ref 0.61–1.24)

## 2017-07-24 MED ORDER — HEPARIN SOD (PORK) LOCK FLUSH 100 UNIT/ML IV SOLN
500.0000 [IU] | Freq: Once | INTRAVENOUS | Status: DC
  Administered 2017-07-24: 500 [IU] via INTRAVENOUS

## 2017-07-24 MED ORDER — IOPAMIDOL (ISOVUE-300) INJECTION 61%
75.0000 mL | Freq: Once | INTRAVENOUS | Status: AC | PRN
  Administered 2017-07-24: 75 mL via INTRAVENOUS

## 2017-07-24 MED ORDER — SODIUM CHLORIDE 0.9% FLUSH
10.0000 mL | INTRAVENOUS | Status: DC | PRN
  Administered 2017-07-24: 10 mL via INTRAVENOUS
  Filled 2017-07-24: qty 10

## 2017-07-27 NOTE — Progress Notes (Signed)
[place]  Telephone:(336) [phone] Fax:(336) [phone]  ID: Julian Medina OB: [birth year]  MR#: [id number]  CSN#:[id number]  Patient Care Team: Guadalupe Maple, MD as PCP - General (Family Medicine) Lloyd Huger, MD as Consulting Physician (Oncology) [name], MD as Consulting Physician (Urology)  CHIEF COMPLAINT: CLL  INTERVAL HISTORY: Patient returns to clinic today for further evaluation and discussion of his imaging results. He continues to be highly anxious.  He currently feels well and is asymptomatic. He denies any recent fevers, night sweats, or weight loss. He has no neurologic complaints. He denies any pain. He has no chest pain or shortness of breath. He denies any nausea, vomiting, constipation, or diarrhea. He has no urinary complaints. Patient offers no further specific complaints today.  REVIEW OF SYSTEMS:   Review of Systems  Constitutional: Negative for diaphoresis, fever, malaise/fatigue and weight loss.  Respiratory: Negative.  Negative for cough and shortness of breath.   Cardiovascular: Negative.  Negative for chest pain and leg swelling.  Gastrointestinal: Negative.  Negative for abdominal pain.  Genitourinary: Negative.   Musculoskeletal: Negative.   Skin: Negative.  Negative for rash.  Neurological: Negative.  Negative for sensory change and weakness.  Psychiatric/Behavioral: The patient is nervous/anxious.     As per HPI. Otherwise, a complete review of systems is negative.  PAST MEDICAL HISTORY: Past Medical History:  Diagnosis Date  . Anxiety   . CLL (chronic lymphocytic leukemia) ([place])   . COPD (chronic obstructive pulmonary disease) ([place])     PAST SURGICAL HISTORY: Past Surgical History:  Procedure Laterality Date  . HERNIA REPAIR    . PERIPHERAL VASCULAR CATHETERIZATION N/A 03/14/2016   Procedure: Glori Luis Cath Insertion;  Surgeon: Algernon Huxley, MD;  Location: [facility];  Service: Cardiovascular;   Laterality: N/A;  . ROBOTIC ASSITED PARTIAL NEPHRECTOMY Left 11/14/2015   Procedure: ROBOTIC ASSITED PARTIAL NEPHRECTOMY with intraoperative drop in ultrasound / Extensive Lysis of adhesions;  Surgeon: [name], MD;  Location: [facility];  Service: Urology;  Laterality: Left;    FAMILY HISTORY: Heart disease and hypertension.     ADVANCED DIRECTIVES:    HEALTH MAINTENANCE: Social History   Tobacco Use  . Smoking status: Current Every Day Smoker    Packs/day: 1.50    Years: 40.00    Pack years: 60.00    Types: Cigarettes  . Smokeless tobacco: Never Used  Substance Use Topics  . Alcohol use: Yes    Alcohol/week: 4.2 oz    Types: 7 Cans of beer per week    Comment: occasional  . Drug use: No     Colonoscopy:  PAP:  Bone density:  Lipid panel:  Allergies  Allergen Reactions  . Aspirin Other (See Comments)    Ulcer   . Hydrochlorothiazide Itching    Current Outpatient Medications  Medication Sig Dispense Refill  . ADVAIR DISKUS 250-50 MCG/DOSE AEPB INHALE 1 PUFF 2 TIMES A DAY 180 each 3  . albuterol (PROVENTIL HFA;VENTOLIN HFA) 108 (90 Base) MCG/ACT inhaler Inhale 2 puffs into the lungs every 6 (six) hours as needed for wheezing or shortness of breath. 1 Inhaler 6  . amLODipine (NORVASC) 10 MG tablet TAKE 1 TABLET (10 MG TOTAL) BY MOUTH DAILY. 90 tablet 0  . benazepril (LOTENSIN) 40 MG tablet 1 TABLET ONCE EACH DAY ORAL 90 tablet 2  . LORazepam (ATIVAN) 1 MG tablet Take 1 tablet (1 mg total) by mouth daily as needed for anxiety. 30 tablet 0  .  metoprolol succinate (TOPROL-XL) 25 MG 24 hr tablet Take 1 tablet (25 mg total) by mouth daily. 90 tablet 1  . lidocaine-prilocaine (EMLA) cream Apply 1 application topically as needed. 30 g 3   No current facility-administered medications for this visit.     OBJECTIVE: Vitals:   07/29/17 1116  BP: 134/84  Pulse: 76     Body mass index is 19.26 kg/m.    ECOG FS:0 - Asymptomatic  General: Well-developed,  well-nourished, no acute distress. Eyes: Pink conjunctiva, anicteric sclera. Lungs: Clear to auscultation bilaterally. Heart: Regular rate and rhythm. No rubs, murmurs, or gallops. Abdomen: Soft, nontender, nondistended. No organomegaly noted, normoactive bowel sounds. Musculoskeletal: No edema, cyanosis, or clubbing. Neuro: Alert, answering all questions appropriately. Cranial nerves grossly intact. Skin: No rashes or petechiae noted. Psych: Normal affect. Lymphatics: Lymphadenopathy resolved.  LAB RESULTS:  Lab Results  Component Value Date   NA 137 04/24/2017   K 4.7 04/24/2017   CL 104 04/24/2017   CO2 24 04/24/2017   GLUCOSE 88 04/24/2017   BUN 15 04/24/2017   CREATININE 1.80 (H) 07/24/2017   CALCIUM 9.6 04/24/2017   PROT 7.1 04/24/2017   ALBUMIN 4.5 04/24/2017   AST 21 04/24/2017   ALT 15 (L) 04/24/2017   ALKPHOS 62 04/24/2017   BILITOT 0.4 04/24/2017   GFRNONAA 41 (L) 04/24/2017   GFRAA 48 (L) 04/24/2017    Lab Results  Component Value Date   WBC 8.8 07/24/2017   NEUTROABS 6.4 07/24/2017   HGB 13.0 07/24/2017   HCT 38.5 (L) 07/24/2017   MCV 92.0 07/24/2017   PLT 191 07/24/2017     STUDIES: Ct Chest W Contrast  Result Date: 07/24/2017 CLINICAL DATA:  CLL staging. History of left renal cell carcinoma with nephrectomy. EXAM: CT CHEST, ABDOMEN, AND PELVIS WITH CONTRAST TECHNIQUE: Multidetector CT imaging of the chest, abdomen and pelvis was performed following the standard protocol during bolus administration of intravenous contrast. CONTRAST:  81mL ISOVUE-300 IOPAMIDOL (ISOVUE-300) INJECTION 61% COMPARISON:  02/17/2017 FINDINGS: CT CHEST FINDINGS Cardiovascular: The heart size is normal. No pericardial effusion. Coronary artery calcification is evident. Atherosclerotic calcification is noted in the wall of the thoracic aorta. Port-A-Cath tip is positioned in the distal SVC. Mediastinum/Nodes: No mediastinal lymphadenopathy. There is no hilar lymphadenopathy. The  esophagus has normal imaging features. Surgical clips noted in the region of the esophagogastric junction. Marked interval decrease in axillary lymphadenopathy. 2.8 cm short axis left axillary lymph node measured on the previous study has decreased to 8 mm short axis today. 7 mm short axis right axillary lymph node on today's exam measured 2.2 cm previously. Lungs/Pleura: Mucus is identified in the trachea. Centrilobular emphysema noted in the lungs. Right middle lobe calcified granuloma. Calcified granuloma identified in the anterior left upper lobe. The medial right upper lobe ground-glass nodule seen on the previous study has essentially resolved. Another ground-glass nodule identified in the superior segment left lower lobe on the previous study is similar (image 53 series 4). New left upper lobe ground-glass nodule seen on image 53 series for posteriorly. 3 mm left upper lobe nodule seen on image 40 series 4 is new. No suspicious pulmonary nodule or mass. No focal airspace consolidation. No pulmonary edema or pleural effusion. Musculoskeletal: Bone windows reveal no worrisome lytic or sclerotic osseous lesions. CT ABDOMEN PELVIS FINDINGS Hepatobiliary: No focal abnormality within the liver parenchyma. There is no evidence for gallstones, gallbladder wall thickening, or pericholecystic fluid. Mild extrahepatic biliary prominence is stable in the interval. Pancreas: No  focal mass lesion. No dilatation of the main duct. No intraparenchymal cyst. No peripancreatic edema. Spleen: No splenomegaly. No focal mass lesion. Adrenals/Urinary Tract: No adrenal nodule or mass. Multiple well-defined low-density lesions are identified in the right kidney with prominent upper and lower pole lesions remaining stable in the interval. There is a new 8 mm interpolar lesion seen on image 20 of series 7 with an average attenuation too high to be a simple cyst. This was not readily evident on the prior exam. Scattered small hypodense  lesions in the left kidney are too small to characterize but appear similar in the interval. 11 mm lesion in the lower pole the left kidney also shows moderately increased attenuation. No evidence for hydroureter. Prominent bladder distention evident. Stomach/Bowel: Surgical changes are noted in the region of the esophagogastric junction. Stomach is nondistended. No gastric wall thickening. No evidence of outlet obstruction. Duodenum is normally positioned as is the ligament of Treitz. No small bowel wall thickening. No small bowel dilatation. The terminal ileum is normal. The appendix is normal. No gross colonic mass. No colonic wall thickening. No substantial diverticular change. Vascular/Lymphatic: There is abdominal aortic atherosclerosis without aneurysm. Marked decrease and abdominal lymphadenopathy seen previously. The 2.7 cm short axis porta hepatis lymph node measured on the prior study has decreased in the interval measuring 9 mm short axis today. The left para-aortic 2.5 cm lymph node measured previously has decreased to 7 mm short axis. Similar decrease in pelvic sidewall lymphadenopathy. The 6.2 x 3.6 cm index right pelvic sidewall lesion measured previously has decreased to 3.1 x 2.0 cm today. Index left external iliac lymph node measured at 3.1 cm short axis on the prior study is now 0.9 cm. Reproductive: The prostate gland and seminal vesicles have normal imaging features. Other: No intraperitoneal free fluid. Musculoskeletal: Bone windows reveal no worrisome lytic or sclerotic osseous lesions. IMPRESSION: 1. Marked interval reduction in the lymphadenopathy involving the chest, abdomen, and pelvis. No new or progressive lymphadenopathy evident on today's study. 2. Waxing and waning of ground-glass and tiny solid-appearing pulmonary nodules bilaterally. Imaging features are nonspecific and likely reflect infectious/inflammatory etiology. Attention on follow-up recommended. 3. Bilateral small renal  lesions some of which have attenuation too high to represent simple cysts. While these are likely cysts complicated by proteinaceous debris or hemorrhage, attention on follow-up is recommended. 4. Prominent bladder distention. 5.  Emphysema. (ICD10-J43.9) 6.  Aortic Atherosclerois (ICD10-170.0) Electronically Signed   By: Misty Stanley M.D.   On: 07/24/2017 13:48   Ct Abdomen Pelvis W Contrast  Result Date: 07/24/2017 CLINICAL DATA:  CLL staging. History of left renal cell carcinoma with nephrectomy. EXAM: CT CHEST, ABDOMEN, AND PELVIS WITH CONTRAST TECHNIQUE: Multidetector CT imaging of the chest, abdomen and pelvis was performed following the standard protocol during bolus administration of intravenous contrast. CONTRAST:  22mL ISOVUE-300 IOPAMIDOL (ISOVUE-300) INJECTION 61% COMPARISON:  02/17/2017 FINDINGS: CT CHEST FINDINGS Cardiovascular: The heart size is normal. No pericardial effusion. Coronary artery calcification is evident. Atherosclerotic calcification is noted in the wall of the thoracic aorta. Port-A-Cath tip is positioned in the distal SVC. Mediastinum/Nodes: No mediastinal lymphadenopathy. There is no hilar lymphadenopathy. The esophagus has normal imaging features. Surgical clips noted in the region of the esophagogastric junction. Marked interval decrease in axillary lymphadenopathy. 2.8 cm short axis left axillary lymph node measured on the previous study has decreased to 8 mm short axis today. 7 mm short axis right axillary lymph node on today's exam measured 2.2 cm previously. Lungs/Pleura:  Mucus is identified in the trachea. Centrilobular emphysema noted in the lungs. Right middle lobe calcified granuloma. Calcified granuloma identified in the anterior left upper lobe. The medial right upper lobe ground-glass nodule seen on the previous study has essentially resolved. Another ground-glass nodule identified in the superior segment left lower lobe on the previous study is similar (image 53  series 4). New left upper lobe ground-glass nodule seen on image 53 series for posteriorly. 3 mm left upper lobe nodule seen on image 40 series 4 is new. No suspicious pulmonary nodule or mass. No focal airspace consolidation. No pulmonary edema or pleural effusion. Musculoskeletal: Bone windows reveal no worrisome lytic or sclerotic osseous lesions. CT ABDOMEN PELVIS FINDINGS Hepatobiliary: No focal abnormality within the liver parenchyma. There is no evidence for gallstones, gallbladder wall thickening, or pericholecystic fluid. Mild extrahepatic biliary prominence is stable in the interval. Pancreas: No focal mass lesion. No dilatation of the main duct. No intraparenchymal cyst. No peripancreatic edema. Spleen: No splenomegaly. No focal mass lesion. Adrenals/Urinary Tract: No adrenal nodule or mass. Multiple well-defined low-density lesions are identified in the right kidney with prominent upper and lower pole lesions remaining stable in the interval. There is a new 8 mm interpolar lesion seen on image 20 of series 7 with an average attenuation too high to be a simple cyst. This was not readily evident on the prior exam. Scattered small hypodense lesions in the left kidney are too small to characterize but appear similar in the interval. 11 mm lesion in the lower pole the left kidney also shows moderately increased attenuation. No evidence for hydroureter. Prominent bladder distention evident. Stomach/Bowel: Surgical changes are noted in the region of the esophagogastric junction. Stomach is nondistended. No gastric wall thickening. No evidence of outlet obstruction. Duodenum is normally positioned as is the ligament of Treitz. No small bowel wall thickening. No small bowel dilatation. The terminal ileum is normal. The appendix is normal. No gross colonic mass. No colonic wall thickening. No substantial diverticular change. Vascular/Lymphatic: There is abdominal aortic atherosclerosis without aneurysm. Marked  decrease and abdominal lymphadenopathy seen previously. The 2.7 cm short axis porta hepatis lymph node measured on the prior study has decreased in the interval measuring 9 mm short axis today. The left para-aortic 2.5 cm lymph node measured previously has decreased to 7 mm short axis. Similar decrease in pelvic sidewall lymphadenopathy. The 6.2 x 3.6 cm index right pelvic sidewall lesion measured previously has decreased to 3.1 x 2.0 cm today. Index left external iliac lymph node measured at 3.1 cm short axis on the prior study is now 0.9 cm. Reproductive: The prostate gland and seminal vesicles have normal imaging features. Other: No intraperitoneal free fluid. Musculoskeletal: Bone windows reveal no worrisome lytic or sclerotic osseous lesions. IMPRESSION: 1. Marked interval reduction in the lymphadenopathy involving the chest, abdomen, and pelvis. No new or progressive lymphadenopathy evident on today's study. 2. Waxing and waning of ground-glass and tiny solid-appearing pulmonary nodules bilaterally. Imaging features are nonspecific and likely reflect infectious/inflammatory etiology. Attention on follow-up recommended. 3. Bilateral small renal lesions some of which have attenuation too high to represent simple cysts. While these are likely cysts complicated by proteinaceous debris or hemorrhage, attention on follow-up is recommended. 4. Prominent bladder distention. 5.  Emphysema. (ICD10-J43.9) 6.  Aortic Atherosclerois (ICD10-170.0) Electronically Signed   By: Misty Stanley M.D.   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  PLAN:    1.  CLL: Flow cytometry confirmed the diagnosis. Patient is also ZAP-70 positive this which indicates it may be a more aggressive form of CLL. CT scan results from July 24, 2017 reviewed independently and reported as above with essentially complete resolution of patient's known  lymphadenopathy.  His white blood cell count continues to be within normal limits.  No further intervention is needed at this time.  If patient had progressive disease, would likely re-use Rituxan and Treanda.  Return to clinic in 3 months for further evaluation.  Reimage in July 2019. 2.  Anxiety: Continue Xanax PRN. 3.  Renal cell carcinoma: Patient is status post partial nephrectomy on Nov 14, 2015. Continued follow-up per urology. CT results as above.  4.  Renal insufficiency: Patient's creatinine appears to be at his baseline.  Monitor.   Patient expressed understanding and was in agreement with this plan. He also understands that He can call clinic at any time with any questions, concerns, or complaints.    Lloyd Huger, MD   08/01/2017 3:14 PM

## 2017-07-29 ENCOUNTER — Other Ambulatory Visit: Payer: Self-pay

## 2017-07-29 ENCOUNTER — Ambulatory Visit: Payer: Self-pay | Admitting: Urology

## 2017-07-29 ENCOUNTER — Encounter: Payer: Self-pay | Admitting: Oncology

## 2017-07-29 ENCOUNTER — Inpatient Hospital Stay (HOSPITAL_BASED_OUTPATIENT_CLINIC_OR_DEPARTMENT_OTHER): Payer: 59 | Admitting: Oncology

## 2017-07-29 VITALS — BP 134/84 | HR 76 | Wt 133.3 lb

## 2017-07-29 DIAGNOSIS — C642 Malignant neoplasm of left kidney, except renal pelvis: Secondary | ICD-10-CM

## 2017-07-29 DIAGNOSIS — J439 Emphysema, unspecified: Secondary | ICD-10-CM | POA: Diagnosis not present

## 2017-07-29 DIAGNOSIS — F17210 Nicotine dependence, cigarettes, uncomplicated: Secondary | ICD-10-CM | POA: Diagnosis not present

## 2017-07-29 DIAGNOSIS — Z452 Encounter for adjustment and management of vascular access device: Secondary | ICD-10-CM | POA: Diagnosis not present

## 2017-07-29 DIAGNOSIS — R590 Localized enlarged lymph nodes: Secondary | ICD-10-CM

## 2017-07-29 DIAGNOSIS — F419 Anxiety disorder, unspecified: Secondary | ICD-10-CM | POA: Diagnosis not present

## 2017-07-29 DIAGNOSIS — I700 Atherosclerosis of aorta: Secondary | ICD-10-CM

## 2017-07-29 DIAGNOSIS — C9190 Lymphoid leukemia, unspecified not having achieved remission: Secondary | ICD-10-CM | POA: Diagnosis not present

## 2017-07-29 DIAGNOSIS — N281 Cyst of kidney, acquired: Secondary | ICD-10-CM

## 2017-07-29 DIAGNOSIS — Z7982 Long term (current) use of aspirin: Secondary | ICD-10-CM | POA: Diagnosis not present

## 2017-07-29 DIAGNOSIS — Z79899 Other long term (current) drug therapy: Secondary | ICD-10-CM

## 2017-07-29 DIAGNOSIS — N3289 Other specified disorders of bladder: Secondary | ICD-10-CM

## 2017-07-29 DIAGNOSIS — C9110 Chronic lymphocytic leukemia of B-cell type not having achieved remission: Secondary | ICD-10-CM

## 2017-07-29 DIAGNOSIS — J449 Chronic obstructive pulmonary disease, unspecified: Secondary | ICD-10-CM

## 2017-07-30 MED ORDER — LIDOCAINE-PRILOCAINE 2.5-2.5 % EX CREA: 1.0000 "application " | TOPICAL_CREAM | CUTANEOUS | 3 refills | Status: AC | PRN

## 2017-08-13 ENCOUNTER — Other Ambulatory Visit: Payer: Self-pay | Admitting: Family Medicine

## 2017-09-04 ENCOUNTER — Inpatient Hospital Stay: Payer: 59

## 2017-09-09 ENCOUNTER — Inpatient Hospital Stay: Payer: 59

## 2017-09-09 ENCOUNTER — Inpatient Hospital Stay: Payer: 59 | Attending: Oncology

## 2017-09-09 DIAGNOSIS — C9110 Chronic lymphocytic leukemia of B-cell type not having achieved remission: Secondary | ICD-10-CM | POA: Diagnosis not present

## 2017-09-09 DIAGNOSIS — Z95828 Presence of other vascular implants and grafts: Secondary | ICD-10-CM

## 2017-09-09 MED ORDER — HEPARIN SOD (PORK) LOCK FLUSH 100 UNIT/ML IV SOLN
INTRAVENOUS | Status: AC
  Filled 2017-09-09: qty 5

## 2017-09-09 MED ORDER — SODIUM CHLORIDE 0.9% FLUSH
10.0000 mL | INTRAVENOUS | Status: DC | PRN
  Administered 2017-09-09: 10 mL via INTRAVENOUS
  Filled 2017-09-09: qty 10

## 2017-09-09 MED ORDER — HEPARIN SOD (PORK) LOCK FLUSH 100 UNIT/ML IV SOLN
500.0000 [IU] | Freq: Once | INTRAVENOUS | Status: AC
  Administered 2017-09-09: 500 [IU] via INTRAVENOUS

## 2017-09-22 ENCOUNTER — Other Ambulatory Visit: Payer: Self-pay | Admitting: Family Medicine

## 2017-09-22 DIAGNOSIS — I10 Essential (primary) hypertension: Secondary | ICD-10-CM

## 2017-09-23 NOTE — Telephone Encounter (Signed)
Routing to provider  

## 2017-09-23 NOTE — Telephone Encounter (Signed)
Amlodipine refill Last OV: was last seen 01/21/17 but did not see any mention od amlodipine just mentioned metoprolol Last Refill:06/26/17  Pharmacy:[facility] [place]

## 2017-10-13 ENCOUNTER — Ambulatory Visit (INDEPENDENT_AMBULATORY_CARE_PROVIDER_SITE_OTHER): Payer: 59 | Admitting: Family Medicine

## 2017-10-13 ENCOUNTER — Encounter: Payer: Self-pay | Admitting: Family Medicine

## 2017-10-13 VITALS — BP 121/75 | HR 76 | Temp 98.7°F | Wt 129.5 lb

## 2017-10-13 DIAGNOSIS — L089 Local infection of the skin and subcutaneous tissue, unspecified: Secondary | ICD-10-CM | POA: Diagnosis not present

## 2017-10-13 DIAGNOSIS — L723 Sebaceous cyst: Secondary | ICD-10-CM | POA: Diagnosis not present

## 2017-10-13 MED ORDER — TRAMADOL HCL 50 MG PO TABS: 50.0000 mg | ORAL_TABLET | Freq: Three times a day (TID) | ORAL | 0 refills | Status: DC | PRN

## 2017-10-13 MED ORDER — SULFAMETHOXAZOLE-TRIMETHOPRIM 800-160 MG PO TABS: 1.0000 | ORAL_TABLET | Freq: Two times a day (BID) | ORAL | 0 refills | Status: DC

## 2017-10-13 NOTE — Progress Notes (Signed)
BP 121/75 (BP Location: Right Arm, Patient Position: Sitting, Cuff Size: Normal)   Pulse 76   Temp 98.7 F (37.1 C) (Oral)   Wt 129 lb 8 oz (58.7 kg)   SpO2 98%   BMI 18.71 kg/m    Subjective:    Patient ID: Julian Medina, male    DOB: 12-03-1945, 72 y.o.   MRN: [id number]  HPI: DREWEY BEGUE is a 72 y.o. male  Chief Complaint  Patient presents with  . Cyst    beside left eye. Redness, stinging and swelling.   Pt here today with inflamed, painful cyst beside left eye that he states started acting up about a week ago. Has had the cyst for most of his life and never had any issues. Denies fever, chills, drainage, body aches. Has not been trying anything at home for it.   Relevant past medical, surgical, family and social history reviewed and updated as indicated. Interim medical history since our last visit reviewed. Allergies and medications reviewed and updated.  Review of Systems  Per HPI unless specifically indicated above     Objective:    BP 121/75 (BP Location: Right Arm, Patient Position: Sitting, Cuff Size: Normal)   Pulse 76   Temp 98.7 F (37.1 C) (Oral)   Wt 129 lb 8 oz (58.7 kg)   SpO2 98%   BMI 18.71 kg/m   Wt Readings from Last 3 Encounters:  10/13/17 129 lb 8 oz (58.7 kg)  07/29/17 133 lb 4.8 oz (60.5 kg)  04/24/17 128 lb 14.4 oz (58.5 kg)    Physical Exam  Constitutional: He is oriented to person, place, and time. He appears well-developed and well-nourished. No distress.  HENT:  Head: Atraumatic.  Eyes: Pupils are equal, round, and reactive to light. Conjunctivae are normal.  Neck: Normal range of motion. Neck supple.  Cardiovascular: Normal rate.  Pulmonary/Chest: Effort normal and breath sounds normal. No respiratory distress.  Musculoskeletal: Normal range of motion.  Neurological: He is alert and oriented to person, place, and time.  Skin: Skin is warm and dry. No rash noted.  Inflamed, erythematous 1.5 cm cyst lateral to left eye. TTP,  fluctuant  Psychiatric: He has a normal mood and affect. His behavior is normal.  Nursing note and vitals reviewed.   Procedure: I and D of infected sebaceous cyst of left temple Procedure explained in detail to pt, and his questions were answered adequately. He wishes to proceed.  Area was cleaned with alcohol and infiltrated with 4 cc 1% lidocaine with epinephrine until adequately numbed. Area was then prepped with betadine. A small incision was made over center of cyst, and gauze and pressure were used to express purulent and sebaceous contents. Once adequately expressed and small amount of bleeding controlled with pressure and gauze, area was irrigated with sterile saline and dressed with neosporin and a bandage. Wound care reviewed with patient at length. Procedure well tolerated with no immediate complications. Blood loss was minimal.   Results for orders placed or performed during the hospital encounter of 07/24/17  I-STAT creatinine  Result Value Ref Range   Creatinine, Ser 1.80 (H) 0.61 - 1.24 mg/dL      Assessment & Plan:   Problem List Items Addressed This Visit    None    Visit Diagnoses    Infected sebaceous cyst    -  Primary   Pt givn options of abx, warm compresses or I and D and abx. Opted for procedure today. Will  send home with tramadol prn and bactrim. Wound care reviewed   Relevant Medications   sulfamethoxazole-trimethoprim (BACTRIM DS,SEPTRA DS) 800-160 MG tablet    Sedation and driving precautions reviewed with pain medication. Pt to follow up if no significant improvement on abx, will then refer for cyst removal.    Follow up plan: Return for as scheduled.

## 2017-10-16 NOTE — Patient Instructions (Signed)
Follow up as needed

## 2017-10-20 ENCOUNTER — Encounter: Payer: Self-pay | Admitting: Family Medicine

## 2017-10-20 ENCOUNTER — Ambulatory Visit (INDEPENDENT_AMBULATORY_CARE_PROVIDER_SITE_OTHER): Payer: 59 | Admitting: Family Medicine

## 2017-10-20 VITALS — BP 100/61 | HR 78 | Temp 97.9°F | Wt 126.8 lb

## 2017-10-20 DIAGNOSIS — K219 Gastro-esophageal reflux disease without esophagitis: Secondary | ICD-10-CM

## 2017-10-20 DIAGNOSIS — L723 Sebaceous cyst: Secondary | ICD-10-CM | POA: Diagnosis not present

## 2017-10-20 DIAGNOSIS — L089 Local infection of the skin and subcutaneous tissue, unspecified: Secondary | ICD-10-CM

## 2017-10-20 DIAGNOSIS — R05 Cough: Secondary | ICD-10-CM | POA: Diagnosis not present

## 2017-10-20 DIAGNOSIS — R058 Other specified cough: Secondary | ICD-10-CM

## 2017-10-20 MED ORDER — PANTOPRAZOLE SODIUM 40 MG PO TBEC: 40.0000 mg | DELAYED_RELEASE_TABLET | Freq: Every day | ORAL | 1 refills | Status: DC

## 2017-10-20 MED ORDER — PREDNISONE 20 MG PO TABS: 40.0000 mg | ORAL_TABLET | Freq: Every day | ORAL | 0 refills | Status: DC

## 2017-10-20 MED ORDER — BENZONATATE 200 MG PO CAPS: 200.0000 mg | ORAL_CAPSULE | Freq: Three times a day (TID) | ORAL | 0 refills | Status: DC | PRN

## 2017-10-20 NOTE — Progress Notes (Signed)
BP 100/61 (BP Location: Right Arm, Patient Position: Sitting, Cuff Size: Normal)   Pulse 78   Temp 97.9 F (36.6 C) (Oral)   Wt 126 lb 12.8 oz (57.5 kg)   SpO2 99%   BMI 18.32 kg/m    Subjective:    Patient ID: Julian Medina, male    DOB: 1946/03/04, 72 y.o.   MRN: [id number]  HPI: Julian Medina is a 72 y.o. male  Chief Complaint  Patient presents with  . Cyst    Left eye, 7 day follow-up. Still taking Bactrim. Patient states it's better.   Pt here today for 1 week f/u on infected cyst lateral to left eye. Cyst was drained and pt has now completed bactrim course. Has been using neosporin and keeping area covered. No further pain, swelling, redness, drainage at this time, states cyst has returned to baseline size. Not wanting to have area removed at this time. Denies fever, chills, aches. Not needing tramadol anymore for pain.   Having indigestion after almost every mean, TUMs helps but wanting something stronger. Has never taken anything other than TUMs for indigestion. Denies abdominal pain, vomiting, fevers.   2 weeks of productive cough with yellow sputum and mild chest tightness. Hx of pneumonia several years ago, pt and wife very concerned about this. Does smoke cigarettes daily   Relevant past medical, surgical, family and social history reviewed and updated as indicated. Interim medical history since our last visit reviewed. Allergies and medications reviewed and updated.  Review of Systems  Per HPI unless specifically indicated above     Objective:    BP 100/61 (BP Location: Right Arm, Patient Position: Sitting, Cuff Size: Normal)   Pulse 78   Temp 97.9 F (36.6 C) (Oral)   Wt 126 lb 12.8 oz (57.5 kg)   SpO2 99%   BMI 18.32 kg/m   Wt Readings from Last 3 Encounters:  10/20/17 126 lb 12.8 oz (57.5 kg)  10/13/17 129 lb 8 oz (58.7 kg)  07/29/17 133 lb 4.8 oz (60.5 kg)    Physical Exam  Constitutional: He is oriented to person, place, and time. He appears  well-developed and well-nourished. No distress.  HENT:  Head: Atraumatic.  Right Ear: External ear normal.  Left Ear: External ear normal.  Nose: Nose normal.  Oropharynx injected  Eyes: Pupils are equal, round, and reactive to light. Conjunctivae are normal. No scleral icterus.  Neck: Normal range of motion. Neck supple.  Cardiovascular: Normal rate and normal heart sounds.  Pulmonary/Chest: Effort normal and breath sounds normal. No respiratory distress. He has no wheezes. He has no rales.  Musculoskeletal: Normal range of motion.  Neurological: He is alert and oriented to person, place, and time.  Skin: Skin is warm and dry.  Non-inflamed sebaceous cyst lateral to left eye, no active redness or drainage at this time  Psychiatric: He has a normal mood and affect. His behavior is normal.  Nursing note and vitals reviewed.   Results for orders placed or performed during the hospital encounter of 07/24/17  I-STAT creatinine  Result Value Ref Range   Creatinine, Ser 1.80 (H) 0.61 - 1.24 mg/dL      Assessment & Plan:   Problem List Items Addressed This Visit      Digestive   GERD (gastroesophageal reflux disease)    Will start protonix daily x 6 weeks, pt then aware to take a short holiday and assess for resolution. Can continue long term if sxs returning at  that point. Discussed lifestyle modifications      Relevant Medications   pantoprazole (PROTONIX) 40 MG tablet    Other Visit Diagnoses    Infected sebaceous cyst    -  Primary   Resolved after I and D and bactrim course. Pt aware to call if wanting referral for cyst excision in future.    Productive cough       Lungs CTAB, vitals WNL. Will get CXR for reassurance and start prednisone and tessalon perles. Plain mucinex, humidifier, delsym prn. Return precautions reviewe   Relevant Orders   DG Chest 2 View (Completed)       Follow up plan: Return for CPE, AWV.

## 2017-10-21 ENCOUNTER — Ambulatory Visit
Admission: RE | Admit: 2017-10-21 | Discharge: 2017-10-21 | Disposition: A | Discharge status: Home / Self Care | Payer: 59 | Source: Ambulatory Visit | Attending: Family Medicine | Admitting: Family Medicine

## 2017-10-21 DIAGNOSIS — R05 Cough: Secondary | ICD-10-CM | POA: Diagnosis present

## 2017-10-21 DIAGNOSIS — R058 Other specified cough: Secondary | ICD-10-CM

## 2017-10-22 DIAGNOSIS — K219 Gastro-esophageal reflux disease without esophagitis: Secondary | ICD-10-CM | POA: Insufficient documentation

## 2017-10-22 NOTE — Patient Instructions (Signed)
Follow up for CPE 

## 2017-10-22 NOTE — Assessment & Plan Note (Signed)
Will start protonix daily x 6 weeks, pt then aware to take a short holiday and assess for resolution. Can continue long term if sxs returning at that point. Discussed lifestyle modifications

## 2017-10-26 NOTE — Progress Notes (Signed)
Julian Medina  [phone] [phone]  ID: TAEVYN HAUSEN OB: 12/05/1945  MR#: [id number]  YBO#:[id number]  Patient Care Team: Guadalupe Maple, MD as PCP - General (Family Medicine) Lloyd Huger, MD as Consulting Physician (Oncology) [name], MD as Consulting Physician (Urology)  CHIEF COMPLAINT: CLL  INTERVAL HISTORY: Patient returns to clinic today for repeat laboratory work and further evaluation.  He continues to be anxious, but otherwise feels well. He denies any recent fevers, night sweats, or weight loss.  He has noted no new lymphadenopathy.  He has no neurologic complaints. He denies any pain. He has no chest pain or shortness of breath. He denies any nausea, vomiting, constipation, or diarrhea. He has no urinary complaints.  Patient offers no specific complaints today.  REVIEW OF SYSTEMS:   Review of Systems  Constitutional: Negative.  Negative for diaphoresis, fever, malaise/fatigue and weight loss.  Respiratory: Negative.  Negative for cough and shortness of breath.   Cardiovascular: Negative.  Negative for chest pain and leg swelling.  Gastrointestinal: Negative.  Negative for abdominal pain.  Genitourinary: Negative.  Negative for dysuria.  Musculoskeletal: Negative.  Negative for back pain.  Skin: Negative.  Negative for rash.  Neurological: Negative.  Negative for sensory change, focal weakness and weakness.  Psychiatric/Behavioral: The patient is nervous/anxious.     As per HPI. Otherwise, a complete review of systems is negative.  PAST MEDICAL HISTORY: Past Medical History:  Diagnosis Date  . Anxiety   . CLL (chronic lymphocytic leukemia) ([place])   . COPD (chronic obstructive pulmonary disease) ([place])     PAST SURGICAL HISTORY: Past Surgical History:  Procedure Laterality Date  . HERNIA REPAIR    . PERIPHERAL VASCULAR CATHETERIZATION N/A 03/14/2016   Procedure: Glori Luis Cath Insertion;  Surgeon: Algernon Huxley, MD;   Location: [place] [facility];  Service: Cardiovascular;  Laterality: N/A;  . ROBOTIC ASSITED PARTIAL NEPHRECTOMY Left 11/14/2015   Procedure: ROBOTIC ASSITED PARTIAL NEPHRECTOMY with intraoperative drop in ultrasound / Extensive Lysis of adhesions;  Surgeon: [name], MD;  Location: [facility];  Service: Urology;  Laterality: Left;    FAMILY HISTORY: Heart disease and hypertension.     ADVANCED DIRECTIVES:    HEALTH MAINTENANCE: Social History   Tobacco Use  . Smoking status: Current Every Day Smoker    Packs/day: 1.50    Years: 40.00    Pack years: 60.00    Types: Cigarettes  . Smokeless tobacco: Never Used  Substance Use Topics  . Alcohol use: Yes    Alcohol/week: 4.2 oz    Types: 7 Cans of beer per week    Comment: occasional  . Drug use: No     Colonoscopy:  PAP:  Bone density:  Lipid panel:  Allergies  Allergen Reactions  . Aspirin Other (See Comments)    Ulcer   . Hydrochlorothiazide Itching    Current Outpatient Medications  Medication Sig Dispense Refill  . ADVAIR DISKUS 250-50 MCG/DOSE AEPB INHALE 1 PUFF 2 TIMES A DAY 180 each 3  . albuterol (PROVENTIL HFA;VENTOLIN HFA) 108 (90 Base) MCG/ACT inhaler Inhale 2 puffs into the lungs every 6 (six) hours as needed for wheezing or shortness of breath. 1 Inhaler 6  . amLODipine (NORVASC) 10 MG tablet TAKE 1 TABLET (10 MG TOTAL) BY MOUTH DAILY. 90 tablet 0  . benazepril (LOTENSIN) 40 MG tablet 1 TABLET ONCE EACH DAY ORAL 90 tablet 2  . benzonatate (TESSALON) 200 MG capsule Take 1 capsule (200 mg  total) by mouth 3 (three) times daily as needed. 40 capsule 0  . lidocaine-prilocaine (EMLA) cream Apply 1 application topically as needed. 30 g 3  . LORazepam (ATIVAN) 1 MG tablet Take 1 tablet (1 mg total) by mouth daily as needed for anxiety. 30 tablet 0  . metoprolol succinate (TOPROL-XL) 25 MG 24 hr tablet TAKE 1 TABLET BY MOUTH EVERY DAY 90 tablet 1  . pantoprazole (PROTONIX) 40 MG tablet Take 1 tablet (40 mg  total) by mouth daily. 90 tablet 1   No current facility-administered medications for this visit.     OBJECTIVE: Vitals:   10/29/17 1510 10/29/17 1513  BP:  121/74  Pulse:  84  Resp: 12   Temp:  98.4 F (36.9 C)     Body mass index is 18.09 kg/m.    ECOG FS:0 - Asymptomatic  General: Well-developed, well-nourished, no acute distress. Eyes: Pink conjunctiva, anicteric sclera. HEENT: Normocephalic, moist mucous membranes, clear oropharnyx. Lungs: Clear to auscultation bilaterally. Heart: Regular rate and rhythm. No rubs, murmurs, or gallops. Abdomen: Soft, nontender, nondistended. No organomegaly noted, normoactive bowel sounds. Musculoskeletal: No edema, cyanosis, or clubbing. Neuro: Alert, answering all questions appropriately. Cranial nerves grossly intact. Skin: No rashes or petechiae noted. Psych: Normal affect. Lymphatics: No cervical, calvicular, axillary or inguinal LAD.  LAB RESULTS:  Lab Results  Component Value Date   NA 131 (L) 10/29/2017   K 4.9 10/29/2017   CL 105 10/29/2017   CO2 18 (L) 10/29/2017   GLUCOSE 95 10/29/2017   BUN 28 (H) 10/29/2017   CREATININE 2.05 (H) 10/29/2017   CALCIUM 8.8 (L) 10/29/2017   PROT 6.2 (L) 10/29/2017   ALBUMIN 4.0 10/29/2017   AST 16 10/29/2017   ALT 11 (L) 10/29/2017   ALKPHOS 64 10/29/2017   BILITOT 0.4 10/29/2017   GFRNONAA 31 (L) 10/29/2017   GFRAA 36 (L) 10/29/2017    Lab Results  Component Value Date   WBC 15.0 (H) 10/29/2017   NEUTROABS 12.3 (H) 10/29/2017   HGB 10.6 (L) 10/29/2017   HCT 30.4 (L) 10/29/2017   MCV 88.2 10/29/2017   PLT 263 10/29/2017     STUDIES: Dg Chest 2 View  Result Date: 10/22/2017 CLINICAL DATA:  History of chronic lymphocytic leukemia. Shortness of breath and cough. EXAM: CHEST - 2 VIEW COMPARISON:  Chest radiograph May 16, 2014; chest CT July 24, 2017 FINDINGS: Port-A-Cath tip is in the superior vena cava.  No pneumothorax. There are apparent nipple shadows bilaterally.  There is no edema or consolidation. The heart size and pulmonary vascularity are normal. There is no appreciable adenopathy. No blastic or lytic bone lesions. Postoperative changes noted at the gastroesophageal junction. IMPRESSION: No edema or consolidation. No evident adenopathy. Postoperative change gastroesophageal junction. Electronically Signed   By: Lowella Grip III M.D.   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   PLAN:    1.  CLL: Flow cytometry confirmed the diagnosis. Patient is also ZAP-70 positive this which indicates it may be a more aggressive form of CLL. CT scan results from July 24, 2017 reviewed independently  with essentially complete resolution of patient's known lymphadenopathy.  His white blood cell count has trended up slightly and is now 15.0.  No further intervention is needed at this time.  Patient has requested not to have routine imaging unless there is suspicion of progression of disease.  If patient had progressive disease,  would likely re-use Rituxan and Treanda.  Return to clinic in 3 months with laboratory work and further evaluation. 2.  Anxiety: Continue Xanax PRN. 3.  Renal cell carcinoma: CT results from July 24, 2017 noted with no evidence of recurrent or progressive disease.  Patient is status post partial nephrectomy on Nov 14, 2015.  Continue follow-up with urology as needed.    4.  Renal insufficiency: Patient's creatinine appears to be at his baseline.  Monitor.  Approximately 20 minutes was spent in discussion of which greater than 50% was consultation.   Patient expressed understanding and was in agreement with this plan. He also understands that He can call clinic at any time with any questions, concerns, or complaints.    Lloyd Huger, MD   11/03/2017 2:08 PM

## 2017-10-28 ENCOUNTER — Other Ambulatory Visit: Payer: Self-pay | Admitting: *Deleted

## 2017-10-28 DIAGNOSIS — C9110 Chronic lymphocytic leukemia of B-cell type not having achieved remission: Secondary | ICD-10-CM

## 2017-10-29 ENCOUNTER — Inpatient Hospital Stay: Payer: 59

## 2017-10-29 ENCOUNTER — Other Ambulatory Visit: Payer: Self-pay

## 2017-10-29 ENCOUNTER — Inpatient Hospital Stay: Payer: 59 | Attending: Oncology | Admitting: Oncology

## 2017-10-29 ENCOUNTER — Encounter: Payer: Self-pay | Admitting: Oncology

## 2017-10-29 VITALS — BP 121/74 | HR 84 | Temp 98.4°F | Resp 12 | Ht 71.0 in | Wt 129.7 lb

## 2017-10-29 DIAGNOSIS — F17210 Nicotine dependence, cigarettes, uncomplicated: Secondary | ICD-10-CM | POA: Insufficient documentation

## 2017-10-29 DIAGNOSIS — F419 Anxiety disorder, unspecified: Secondary | ICD-10-CM

## 2017-10-29 DIAGNOSIS — C9110 Chronic lymphocytic leukemia of B-cell type not having achieved remission: Secondary | ICD-10-CM

## 2017-10-29 DIAGNOSIS — Z95828 Presence of other vascular implants and grafts: Secondary | ICD-10-CM

## 2017-10-29 DIAGNOSIS — N2889 Other specified disorders of kidney and ureter: Secondary | ICD-10-CM | POA: Diagnosis not present

## 2017-10-29 DIAGNOSIS — J449 Chronic obstructive pulmonary disease, unspecified: Secondary | ICD-10-CM | POA: Insufficient documentation

## 2017-10-29 DIAGNOSIS — Z79899 Other long term (current) drug therapy: Secondary | ICD-10-CM | POA: Insufficient documentation

## 2017-10-29 DIAGNOSIS — C9190 Lymphoid leukemia, unspecified not having achieved remission: Secondary | ICD-10-CM

## 2017-10-29 DIAGNOSIS — Z85528 Personal history of other malignant neoplasm of kidney: Secondary | ICD-10-CM

## 2017-10-29 LAB — COMPREHENSIVE METABOLIC PANEL
ALT: 11 U/L — ABNORMAL LOW (ref 17–63)
ANION GAP: 8 (ref 5–15)
AST: 16 U/L (ref 15–41)
Albumin: 4 g/dL (ref 3.5–5.0)
Alkaline Phosphatase: 64 U/L (ref 38–126)
BUN: 28 mg/dL — AB (ref 6–20)
CHLORIDE: 105 mmol/L (ref 101–111)
CO2: 18 mmol/L — ABNORMAL LOW (ref 22–32)
Calcium: 8.8 mg/dL — ABNORMAL LOW (ref 8.9–10.3)
Creatinine, Ser: 2.05 mg/dL — ABNORMAL HIGH (ref 0.61–1.24)
GFR, EST AFRICAN AMERICAN: 36 mL/min — AB (ref >60)
GFR, EST NON AFRICAN AMERICAN: 31 mL/min — AB (ref >60)
Glucose, Bld: 95 mg/dL (ref 65–99)
POTASSIUM: 4.9 mmol/L (ref 3.5–5.1)
Sodium: 131 mmol/L — ABNORMAL LOW (ref 135–145)
Total Bilirubin: 0.4 mg/dL (ref 0.3–1.2)
Total Protein: 6.2 g/dL — ABNORMAL LOW (ref 6.5–8.1)

## 2017-10-29 LAB — CBC WITH DIFFERENTIAL/PLATELET
BASOS ABS: 0.2 10*3/uL — AB (ref 0–0.1)
Basophils Relative: 2 %
Eosinophils Absolute: 0.5 10*3/uL (ref 0–0.7)
Eosinophils Relative: 4 %
HEMATOCRIT: 30.4 % — AB (ref 40.0–52.0)
HEMOGLOBIN: 10.6 g/dL — AB (ref 13.0–18.0)
LYMPHS ABS: 1.3 10*3/uL (ref 1.0–3.6)
LYMPHS PCT: 9 %
MCH: 30.7 pg (ref 26.0–34.0)
MCHC: 34.8 g/dL (ref 32.0–36.0)
MCV: 88.2 fL (ref 80.0–100.0)
Monocytes Absolute: 0.6 10*3/uL (ref 0.2–1.0)
Monocytes Relative: 4 %
NEUTROS ABS: 12.3 10*3/uL — AB (ref 1.4–6.5)
NEUTROS PCT: 81 %
Platelets: 263 10*3/uL (ref 150–440)
RBC: 3.45 MIL/uL — AB (ref 4.40–5.90)
RDW: 14.5 % (ref 11.5–14.5)
WBC: 15 10*3/uL — AB (ref 3.8–10.6)

## 2017-10-29 MED ORDER — HEPARIN SOD (PORK) LOCK FLUSH 100 UNIT/ML IV SOLN
500.0000 [IU] | INTRAVENOUS | Status: AC | PRN
  Administered 2017-10-29: 500 [IU]

## 2017-10-29 MED ORDER — SODIUM CHLORIDE 0.9% FLUSH
10.0000 mL | INTRAVENOUS | Status: AC | PRN
  Administered 2017-10-29: 10 mL
  Filled 2017-10-29: qty 10

## 2017-10-29 NOTE — Progress Notes (Signed)
Patient here for follow up. No changes since his last visit.

## 2017-11-10 ENCOUNTER — Telehealth: Payer: Self-pay | Admitting: Family Medicine

## 2017-11-10 NOTE — Telephone Encounter (Signed)
Please advise. PT not seen for routine OV since 01/2017.

## 2017-11-10 NOTE — Telephone Encounter (Signed)
Copied from [place] [id number]. Topic: Quick Communication - See Telephone Encounter >> Nov 10, 2017  1:11 PM Boyd Kerbs wrote: CRM for notification.   Pt. Called about BP medicine - (amLODipine (NORVASC) 10 MG tablet - thinks it is this one- last one given)   He is saying this last 3 months he is broken out in rash . Itching bad.  He is still taking it.  Says the first time taking the medication was good, but when refilled he started breaking out. This is on both legs, this has been going on for couple months.   Please call pt. And let know what to do   See Telephone encounter for: 11/10/17.

## 2017-11-10 NOTE — Telephone Encounter (Signed)
Call pt needs apt with someone to check rash

## 2017-11-24 ENCOUNTER — Ambulatory Visit
Admission: EM | Admit: 2017-11-24 | Discharge: 2017-11-24 | Disposition: A | Discharge status: Home / Self Care | Payer: 59 | Attending: Family Medicine | Admitting: Family Medicine

## 2017-11-24 ENCOUNTER — Encounter: Payer: Self-pay | Admitting: Emergency Medicine

## 2017-11-24 ENCOUNTER — Other Ambulatory Visit: Payer: Self-pay

## 2017-11-24 DIAGNOSIS — J441 Chronic obstructive pulmonary disease with (acute) exacerbation: Secondary | ICD-10-CM | POA: Diagnosis not present

## 2017-11-24 DIAGNOSIS — F17210 Nicotine dependence, cigarettes, uncomplicated: Secondary | ICD-10-CM

## 2017-11-24 MED ORDER — BENZONATATE 200 MG PO CAPS: 200.0000 mg | ORAL_CAPSULE | Freq: Three times a day (TID) | ORAL | 0 refills | Status: DC | PRN

## 2017-11-24 MED ORDER — DOXYCYCLINE HYCLATE 100 MG PO TABS: 100.0000 mg | ORAL_TABLET | Freq: Two times a day (BID) | ORAL | 0 refills | Status: DC

## 2017-11-24 MED ORDER — PREDNISONE 20 MG PO TABS: ORAL_TABLET | ORAL | 0 refills | Status: DC

## 2017-11-24 NOTE — ED Provider Notes (Signed)
[facility]    CSN: [id number] Arrival date & time: 11/24/17  1536     History   Chief Complaint Chief Complaint  Patient presents with  . Cough    HPI Julian Medina is a 72 y.o. male.   The history is provided by the patient.  Cough  Associated symptoms: wheezing   URI  Presenting symptoms: congestion and cough   Severity:  Moderate Onset quality:  Sudden Duration:  3 weeks Timing:  Constant Progression:  Worsening Chronicity:  New Relieved by:  Nothing Ineffective treatments:  Inhaler and OTC medications Associated symptoms: wheezing   Risk factors: chronic respiratory disease (copd) and sick contacts   Risk factors: not elderly, no chronic cardiac disease, no chronic kidney disease, no diabetes mellitus, no immunosuppression, no recent illness and no recent travel     Past Medical History:  Diagnosis Date  . Anxiety   . CLL (chronic lymphocytic leukemia) ([place])   . COPD (chronic obstructive pulmonary disease) [facility])     Patient Active Problem List   Diagnosis Date Noted  . GERD (gastroesophageal reflux disease) 10/22/2017  . Senile purpura ([place]) 12/18/2016  . Sebaceous cyst of ear 05/06/2016  . Renal cell carcinoma of left kidney ([place]) 02/14/2016  . Anxiety 10/26/2015  . Essential hypertension 05/04/2015  . COPD (chronic obstructive pulmonary disease) ([place]) 05/04/2015  . Chronic lymphocytic leukemia ([place]) 05/04/2015  . Vertigo 05/04/2015  . BPH (benign prostatic hyperplasia) 05/04/2015    Past Surgical History:  Procedure Laterality Date  . HERNIA REPAIR    . PERIPHERAL VASCULAR CATHETERIZATION N/A 03/14/2016   Procedure: Glori Luis Cath Insertion;  Surgeon: Algernon Huxley, MD;  Location: [place] [facility];  Service: Cardiovascular;  Laterality: N/A;  . ROBOTIC ASSITED PARTIAL NEPHRECTOMY Left 11/14/2015   Procedure: ROBOTIC ASSITED PARTIAL NEPHRECTOMY with intraoperative drop in ultrasound / Extensive Lysis of adhesions;  Surgeon: [name],  MD;  Location: [facility];  Service: Urology;  Laterality: Left;       Home Medications    Prior to Admission medications   Medication Sig Start Date End Date Taking? Authorizing Provider  ADVAIR DISKUS 250-50 MCG/DOSE AEPB INHALE 1 PUFF 2 TIMES A DAY 05/13/17  Yes Johnson, Megan P, DO  albuterol (PROVENTIL HFA;VENTOLIN HFA) 108 (90 Base) MCG/ACT inhaler Inhale 2 puffs into the lungs every 6 (six) hours as needed for wheezing or shortness of breath. 12/27/16  Yes Volney American, PA-C  amLODipine (NORVASC) 10 MG tablet TAKE 1 TABLET (10 MG TOTAL) BY MOUTH DAILY. 09/23/17  Yes Johnson, Megan P, DO  benazepril (LOTENSIN) 40 MG tablet 1 TABLET ONCE EACH DAY ORAL 05/14/17  Yes Crissman, Jeannette How, MD  LORazepam (ATIVAN) 1 MG tablet Take 1 tablet (1 mg total) by mouth daily as needed for anxiety. 11/13/16  Yes Lloyd Huger, MD  metoprolol succinate (TOPROL-XL) 25 MG 24 hr tablet TAKE 1 TABLET BY MOUTH EVERY DAY 08/13/17  Yes Crissman, Jeannette How, MD  pantoprazole (PROTONIX) 40 MG tablet Take 1 tablet (40 mg total) by mouth daily. 10/20/17  Yes Volney American, PA-C  benzonatate (TESSALON) 200 MG capsule Take 1 capsule (200 mg total) by mouth 3 (three) times daily as needed. 11/24/17   Norval Gable, MD  doxycycline (VIBRA-TABS) 100 MG tablet Take 1 tablet (100 mg total) by mouth 2 (two) times daily. 11/24/17   Norval Gable, MD  lidocaine-prilocaine (EMLA) cream Apply 1 application topically as needed. 07/30/17   Lloyd Huger, MD  predniSONE (DELTASONE) 20 MG tablet 3 tabs po qd x 2 days, then 2 tabs po qd x 2 days, then 1 tab po qd x 2 days, then half a tab po qd x 2 days 11/24/17   Norval Gable, MD    Family History Family History  Problem Relation Age of Onset  . Hearing loss Brother     Social History Social History   Tobacco Use  . Smoking status: Current Every Day Smoker    Packs/day: 1.50    Years: 40.00    Pack years: 60.00    Types: Cigarettes  . Smokeless  tobacco: Never Used  Substance Use Topics  . Alcohol use: Yes    Alcohol/week: 4.2 oz    Types: 7 Cans of beer per week    Comment: occasional  . Drug use: No     Allergies   Aspirin and Hydrochlorothiazide   Review of Systems Review of Systems  HENT: Positive for congestion.   Respiratory: Positive for cough and wheezing.      Physical Exam Triage Vital Signs ED Triage Vitals  Enc Vitals Group     BP 11/24/17 1541 120/74     Pulse Rate 11/24/17 1541 83     Resp 11/24/17 1541 16     Temp 11/24/17 1541 98.3 F (36.8 C)     Temp Source 11/24/17 1541 Oral     SpO2 11/24/17 1541 100 %     Weight 11/24/17 1538 130 lb (59 kg)     Height 11/24/17 1538 5\' 11"  (1.803 m)     Head Circumference --      Peak Flow --      Pain Score 11/24/17 1538 0     Pain Loc --      Pain Edu? --      Excl. in [place]? --    No data found.  Updated Vital Signs BP 120/74 (BP Location: Left Arm)   Pulse 83   Temp 98.3 F (36.8 C) (Oral)   Resp 16   Ht 5\' 11"  (1.803 m)   Wt 130 lb (59 kg)   SpO2 100%   BMI 18.13 kg/m   Visual Acuity Right Eye Distance:   Left Eye Distance:   Bilateral Distance:    Right Eye Near:   Left Eye Near:    Bilateral Near:     Physical Exam  Constitutional: He appears well-developed and well-nourished. No distress.  HENT:  Head: Normocephalic and atraumatic.  Right Ear: Tympanic membrane, external ear and ear canal normal.  Left Ear: Tympanic membrane, external ear and ear canal normal.  Nose: Nose normal.  Mouth/Throat: Uvula is midline, oropharynx is clear and moist and mucous membranes are normal. No oropharyngeal exudate or tonsillar abscesses.  Eyes: Pupils are equal, round, and reactive to light. Conjunctivae and EOM are normal. Right eye exhibits no discharge. Left eye exhibits no discharge. No scleral icterus.  Neck: Normal range of motion. Neck supple. No tracheal deviation present. No thyromegaly present.  Cardiovascular: Normal rate, regular  rhythm and normal heart sounds.  Pulmonary/Chest: Effort normal. No stridor. No respiratory distress. He has wheezes (mild, diffuse and rhonchi). He has no rales. He exhibits no tenderness.  Lymphadenopathy:    He has no cervical adenopathy.  Neurological: He is alert.  Skin: Skin is warm and dry. No rash noted. He is not diaphoretic.  Nursing note and vitals reviewed.    UC Treatments / Results  Labs (all labs ordered are listed,  but only abnormal results are displayed) Labs Reviewed - No data to display  EKG None  Radiology No results found.  Procedures Procedures (including critical care time)  Medications Ordered in UC Medications - No data to display  Initial Impression / Assessment and Plan / UC Course  I have reviewed the triage vital signs and the nursing notes.  Pertinent labs & imaging results that were available during my care of the patient were reviewed by me and considered in my medical decision making (see chart for details).      Final Clinical Impressions(s) / UC Diagnoses   Final diagnoses:  COPD exacerbation [facility])   Discharge Instructions   None    ED Prescriptions    Medication Sig Dispense Auth. Provider   doxycycline (VIBRA-TABS) 100 MG tablet Take 1 tablet (100 mg total) by mouth 2 (two) times daily. 20 tablet Keelon Zurn, Linward Foster, MD   predniSONE (DELTASONE) 20 MG tablet 3 tabs po qd x 2 days, then 2 tabs po qd x 2 days, then 1 tab po qd x 2 days, then half a tab po qd x 2 days 13 tablet Sol Odor, MD   benzonatate (TESSALON) 200 MG capsule Take 1 capsule (200 mg total) by mouth 3 (three) times daily as needed. 30 capsule Norval Gable, MD     1. diagnosis reviewed with patient 2. rx as per orders above; reviewed possible side effects, interactions, risks and benefits  3. Recommend continue current home inhalers 4. Follow-up prn if symptoms worsen or don't improve   Controlled Substance Prescriptions [place] Controlled Substance Registry  consulted? Not Applicable   Norval Gable, MD 11/24/17 2016

## 2017-11-24 NOTE — ED Triage Notes (Signed)
Patient c/o cough and chest congestion for 3 weeks.

## 2017-11-26 ENCOUNTER — Ambulatory Visit: Payer: Self-pay | Admitting: Unknown Physician Specialty

## 2017-12-17 ENCOUNTER — Inpatient Hospital Stay: Payer: Medicare Other | Attending: Oncology

## 2017-12-17 DIAGNOSIS — C9110 Chronic lymphocytic leukemia of B-cell type not having achieved remission: Secondary | ICD-10-CM | POA: Diagnosis not present

## 2017-12-17 DIAGNOSIS — Z95828 Presence of other vascular implants and grafts: Secondary | ICD-10-CM

## 2017-12-17 MED ORDER — HEPARIN SOD (PORK) LOCK FLUSH 100 UNIT/ML IV SOLN
500.0000 [IU] | Freq: Once | INTRAVENOUS | Status: AC
  Administered 2017-12-17: 500 [IU] via INTRAVENOUS

## 2017-12-17 MED ORDER — SODIUM CHLORIDE 0.9% FLUSH
10.0000 mL | Freq: Once | INTRAVENOUS | Status: AC
  Administered 2017-12-17: 10 mL via INTRAVENOUS
  Filled 2017-12-17: qty 10

## 2017-12-21 ENCOUNTER — Other Ambulatory Visit: Payer: Self-pay | Admitting: Family Medicine

## 2017-12-21 DIAGNOSIS — I10 Essential (primary) hypertension: Secondary | ICD-10-CM

## 2017-12-22 NOTE — Telephone Encounter (Signed)
amlodipine refill Last Refill:09/23/17 # 90 no RF Last OV: 01/21/17 (has upcoming appt 03/12/18 PCP: Dr Jeananne Rama Pharmacy:[facility] [address]

## 2018-01-06 ENCOUNTER — Encounter: Payer: Self-pay | Admitting: Family Medicine

## 2018-01-25 NOTE — Progress Notes (Signed)
[place]  [phone] [phone]  ID: Julian Medina OB: Nov 25, 1945  MR#: [id number]  AOZ#:[id number]  Patient Care Team: Guadalupe Maple, MD as PCP - General (Family Medicine) Lloyd Huger, MD as Consulting Physician (Oncology) [name], MD as Consulting Physician (Urology)  CHIEF COMPLAINT: CLL  INTERVAL HISTORY: Patient returns to clinic today for repeat laboratory work and further evaluation.  He has a decreased appetite and has noted some weight loss.  He continues to be anxious, but otherwise feels well.  He denies any recent fevers, night sweats, or weight loss.  He has noted no new lymphadenopathy.  He has no neurologic complaints. He denies any pain. He has no chest pain or shortness of breath. He denies any nausea, vomiting, constipation, or diarrhea. He has no urinary complaints.  Patient feels at his baseline offers no further specific complaints today.  REVIEW OF SYSTEMS:   Review of Systems  Constitutional: Positive for weight loss. Negative for diaphoresis, fever and malaise/fatigue.  Respiratory: Negative.  Negative for cough and shortness of breath.   Cardiovascular: Negative.  Negative for chest pain and leg swelling.  Gastrointestinal: Negative.  Negative for abdominal pain and constipation.  Genitourinary: Negative.  Negative for dysuria.  Musculoskeletal: Negative.  Negative for back pain.  Skin: Negative.  Negative for rash.  Neurological: Negative.  Negative for sensory change, focal weakness and weakness.  Psychiatric/Behavioral: The patient is nervous/anxious.     As per HPI. Otherwise, a complete review of systems is negative.  PAST MEDICAL HISTORY: Past Medical History:  Diagnosis Date  . Anxiety   . CLL (chronic lymphocytic leukemia) ([place])   . COPD (chronic obstructive pulmonary disease) ([place])     PAST SURGICAL HISTORY: Past Surgical History:  Procedure Laterality Date  . HERNIA REPAIR    .  PERIPHERAL VASCULAR CATHETERIZATION N/A 03/14/2016   Procedure: Glori Luis Cath Insertion;  Surgeon: Algernon Huxley, MD;  Location: [facility];  Service: Cardiovascular;  Laterality: N/A;  . ROBOTIC ASSITED PARTIAL NEPHRECTOMY Left 11/14/2015   Procedure: ROBOTIC ASSITED PARTIAL NEPHRECTOMY with intraoperative drop in ultrasound / Extensive Lysis of adhesions;  Surgeon: [name], MD;  Location: [facility];  Service: Urology;  Laterality: Left;    FAMILY HISTORY: Heart disease and hypertension.     ADVANCED DIRECTIVES:    HEALTH MAINTENANCE: Social History   Tobacco Use  . Smoking status: Current Every Day Smoker    Packs/day: 1.50    Years: 40.00    Pack years: 60.00    Types: Cigarettes  . Smokeless tobacco: Never Used  Substance Use Topics  . Alcohol use: Yes    Alcohol/week: 4.2 oz    Types: 7 Cans of beer per week    Comment: occasional  . Drug use: No     Colonoscopy:  PAP:  Bone density:  Lipid panel:  Allergies  Allergen Reactions  . Aspirin Other (See Comments)    Ulcer   . Hydrochlorothiazide Itching    Current Outpatient Medications  Medication Sig Dispense Refill  . ADVAIR DISKUS 250-50 MCG/DOSE AEPB INHALE 1 PUFF 2 TIMES A DAY 180 each 3  . albuterol (PROVENTIL HFA;VENTOLIN HFA) 108 (90 Base) MCG/ACT inhaler Inhale 2 puffs into the lungs every 6 (six) hours as needed for wheezing or shortness of breath. 1 Inhaler 6  . amLODipine (NORVASC) 10 MG tablet TAKE 1 TABLET BY MOUTH EVERY DAY 90 tablet 0  . benazepril (LOTENSIN) 40 MG tablet 1 TABLET ONCE  EACH DAY ORAL 90 tablet 2  . lidocaine-prilocaine (EMLA) cream Apply 1 application topically as needed. 30 g 3  . metoprolol succinate (TOPROL-XL) 25 MG 24 hr tablet TAKE 1 TABLET BY MOUTH EVERY DAY 90 tablet 1  . pantoprazole (PROTONIX) 40 MG tablet Take 1 tablet (40 mg total) by mouth daily. 90 tablet 1  . LORazepam (ATIVAN) 1 MG tablet Take 1 tablet (1 mg total) by mouth daily as needed for anxiety.  (Patient not taking: Reported on 01/26/2018) 30 tablet 0  . megestrol (MEGACE) 40 MG tablet Take 1 tablet (40 mg total) by mouth daily. 30 tablet 2   No current facility-administered medications for this visit.    Facility-Administered Medications Ordered in Other Visits  Medication Dose Route Frequency Provider Last Rate Last Dose  . sodium chloride flush (NS) 0.9 % injection 10 mL  10 mL Intravenous PRN Lloyd Huger, MD   10 mL at 01/26/18 1515    OBJECTIVE: Vitals:   01/26/18 1543  BP: 127/77  Pulse: 71  Resp: 18  Temp: 97.9 F (36.6 C)     Body mass index is 17.73 kg/m.    ECOG FS:0 - Asymptomatic  General: Well-developed, well-nourished, no acute distress. Eyes: Pink conjunctiva, anicteric sclera. HEENT: Normocephalic, moist mucous membranes, clear oropharnyx. Lungs: Clear to auscultation bilaterally. Heart: Regular rate and rhythm. No rubs, murmurs, or gallops. Abdomen: Soft, nontender, nondistended. No organomegaly noted, normoactive bowel sounds. Musculoskeletal: No edema, cyanosis, or clubbing. Neuro: Alert, answering all questions appropriately. Cranial nerves grossly intact. Skin: No rashes or petechiae noted. Psych: Normal affect. Lymphatics: No cervical, calvicular, axillary or inguinal LAD.  LAB RESULTS:  Lab Results  Component Value Date   NA 134 (L) 01/26/2018   K 4.1 01/26/2018   CL 105 01/26/2018   CO2 21 (L) 01/26/2018   GLUCOSE 106 (H) 01/26/2018   BUN 21 01/26/2018   CREATININE 1.80 (H) 01/26/2018   CALCIUM 9.2 01/26/2018   PROT 6.6 01/26/2018   ALBUMIN 4.2 01/26/2018   AST 18 01/26/2018   ALT 9 01/26/2018   ALKPHOS 73 01/26/2018   BILITOT 0.4 01/26/2018   GFRNONAA 36 (L) 01/26/2018   GFRAA 42 (L) 01/26/2018    Lab Results  Component Value Date   WBC 10.7 (H) 01/26/2018   NEUTROABS 6.4 01/26/2018   HGB 11.3 (L) 01/26/2018   HCT 32.8 (L) 01/26/2018   MCV 87.5 01/26/2018   PLT 251 01/26/2018     STUDIES: No results  found.  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   PLAN:    1.  CLL: Flow cytometry confirmed the diagnosis. Patient is also ZAP-70 positive this which indicates it may be a more aggressive form of CLL. CT scan results from July 24, 2017 reviewed independently  with essentially complete resolution of patient's known lymphadenopathy.  Patient completed 3 cycles of Rituxan and Treanda on April 25, 2017.  His white count is within normal limits at 10.7 today.  No intervention is needed at this time. Patient has requested not to have routine imaging unless there is suspicion of progression of disease.  If patient had progressive disease, would likely re-use Rituxan and Treanda.  Return to clinic in 3 months for laboratory work only and then in 6 months for laboratory work and further evaluation. 2.  Anxiety: Continue Xanax PRN. 3.  Renal cell carcinoma: CT results from July 24, 2017 noted with no evidence of recurrent or  progressive disease.  Patient is status post partial nephrectomy on Nov 14, 2015.  Continue follow-up with urology as needed.    4.  Renal insufficiency: Patient's creatinine has trended down and is now 1.8.  Monitor.   Patient expressed understanding and was in agreement with this plan. He also understands that He can call clinic at any time with any questions, concerns, or complaints.    Lloyd Huger, MD   01/30/2018 2:16 PM

## 2018-01-26 ENCOUNTER — Other Ambulatory Visit: Payer: Self-pay | Admitting: *Deleted

## 2018-01-26 ENCOUNTER — Encounter: Payer: Self-pay | Admitting: Oncology

## 2018-01-26 ENCOUNTER — Inpatient Hospital Stay (HOSPITAL_BASED_OUTPATIENT_CLINIC_OR_DEPARTMENT_OTHER): Payer: Medicare Other | Admitting: Oncology

## 2018-01-26 ENCOUNTER — Inpatient Hospital Stay: Payer: Medicare Other | Attending: Oncology

## 2018-01-26 VITALS — BP 127/77 | HR 71 | Temp 97.9°F | Resp 18 | Wt 127.1 lb

## 2018-01-26 DIAGNOSIS — R630 Anorexia: Secondary | ICD-10-CM

## 2018-01-26 DIAGNOSIS — C9190 Lymphoid leukemia, unspecified not having achieved remission: Secondary | ICD-10-CM | POA: Diagnosis not present

## 2018-01-26 DIAGNOSIS — N2889 Other specified disorders of kidney and ureter: Secondary | ICD-10-CM

## 2018-01-26 DIAGNOSIS — R634 Abnormal weight loss: Secondary | ICD-10-CM | POA: Insufficient documentation

## 2018-01-26 DIAGNOSIS — J449 Chronic obstructive pulmonary disease, unspecified: Secondary | ICD-10-CM | POA: Diagnosis not present

## 2018-01-26 DIAGNOSIS — Z79899 Other long term (current) drug therapy: Secondary | ICD-10-CM | POA: Insufficient documentation

## 2018-01-26 DIAGNOSIS — F419 Anxiety disorder, unspecified: Secondary | ICD-10-CM

## 2018-01-26 DIAGNOSIS — C9110 Chronic lymphocytic leukemia of B-cell type not having achieved remission: Secondary | ICD-10-CM

## 2018-01-26 DIAGNOSIS — F17210 Nicotine dependence, cigarettes, uncomplicated: Secondary | ICD-10-CM | POA: Diagnosis not present

## 2018-01-26 DIAGNOSIS — Z85528 Personal history of other malignant neoplasm of kidney: Secondary | ICD-10-CM | POA: Diagnosis not present

## 2018-01-26 LAB — CBC WITH DIFFERENTIAL/PLATELET
BAND NEUTROPHILS: 0 %
BLASTS: 0 %
Basophils Absolute: 0 10*3/uL (ref 0–0.1)
Basophils Relative: 0 %
EOS ABS: 0.5 10*3/uL (ref 0–0.7)
Eosinophils Relative: 5 %
HEMATOCRIT: 32.8 % — AB (ref 40.0–52.0)
Hemoglobin: 11.3 g/dL — ABNORMAL LOW (ref 13.0–18.0)
LYMPHS PCT: 31 %
Lymphs Abs: 3.3 10*3/uL (ref 1.0–3.6)
MCH: 30.1 pg (ref 26.0–34.0)
MCHC: 34.3 g/dL (ref 32.0–36.0)
MCV: 87.5 fL (ref 80.0–100.0)
Metamyelocytes Relative: 0 %
Monocytes Absolute: 0.5 10*3/uL (ref 0.2–1.0)
Monocytes Relative: 5 %
Myelocytes: 0 %
NEUTROS PCT: 59 %
NRBC: 0 /100{WBCs}
Neutro Abs: 6.4 10*3/uL (ref 1.4–6.5)
OTHER: 0 %
Platelets: 251 10*3/uL (ref 150–440)
Promyelocytes Relative: 0 %
RBC: 3.75 MIL/uL — ABNORMAL LOW (ref 4.40–5.90)
RDW: 16.4 % — ABNORMAL HIGH (ref 11.5–14.5)
SMEAR REVIEW: ADEQUATE
WBC: 10.7 10*3/uL — ABNORMAL HIGH (ref 3.8–10.6)

## 2018-01-26 LAB — COMPREHENSIVE METABOLIC PANEL
ALBUMIN: 4.2 g/dL (ref 3.5–5.0)
ALT: 9 U/L (ref 0–44)
ANION GAP: 8 (ref 5–15)
AST: 18 U/L (ref 15–41)
Alkaline Phosphatase: 73 U/L (ref 38–126)
BUN: 21 mg/dL (ref 8–23)
CO2: 21 mmol/L — AB (ref 22–32)
Calcium: 9.2 mg/dL (ref 8.9–10.3)
Chloride: 105 mmol/L (ref 98–111)
Creatinine, Ser: 1.8 mg/dL — ABNORMAL HIGH (ref 0.61–1.24)
GFR calc Af Amer: 42 mL/min — ABNORMAL LOW (ref >60)
GFR calc non Af Amer: 36 mL/min — ABNORMAL LOW (ref >60)
GLUCOSE: 106 mg/dL — AB (ref 70–99)
POTASSIUM: 4.1 mmol/L (ref 3.5–5.1)
SODIUM: 134 mmol/L — AB (ref 135–145)
TOTAL PROTEIN: 6.6 g/dL (ref 6.5–8.1)
Total Bilirubin: 0.4 mg/dL (ref 0.3–1.2)

## 2018-01-26 MED ORDER — HEPARIN SOD (PORK) LOCK FLUSH 100 UNIT/ML IV SOLN
500.0000 [IU] | Freq: Once | INTRAVENOUS | Status: AC
  Administered 2018-01-26: 500 [IU] via INTRAVENOUS
  Filled 2018-01-26: qty 5

## 2018-01-26 MED ORDER — MEGESTROL ACETATE 40 MG PO TABS: 40.0000 mg | ORAL_TABLET | Freq: Every day | ORAL | 2 refills | Status: DC

## 2018-01-26 MED ORDER — SODIUM CHLORIDE 0.9% FLUSH
10.0000 mL | INTRAVENOUS | Status: AC | PRN
  Administered 2018-01-26: 10 mL via INTRAVENOUS
  Filled 2018-01-26: qty 10

## 2018-01-26 NOTE — Progress Notes (Signed)
cbc

## 2018-01-26 NOTE — Progress Notes (Signed)
Pt in for follow up, continues to have weight loss (3lbs) and no appetite.  Inquired if "there is something I can take to help me eat".

## 2018-02-01 ENCOUNTER — Other Ambulatory Visit: Payer: Self-pay | Admitting: Family Medicine

## 2018-02-15 ENCOUNTER — Other Ambulatory Visit: Payer: Self-pay | Admitting: Family Medicine

## 2018-02-15 DIAGNOSIS — I10 Essential (primary) hypertension: Secondary | ICD-10-CM

## 2018-02-16 NOTE — Telephone Encounter (Signed)
Lotensin 40 mg refill request  LOV 01/21/17 with Dr. Jeananne Rama.   LR:  05/14/17  #90  Refills:  2  [facility] [place]

## 2018-03-03 ENCOUNTER — Ambulatory Visit: Payer: 59 | Admitting: Family Medicine

## 2018-03-06 ENCOUNTER — Inpatient Hospital Stay: Payer: Medicare Other | Attending: Oncology

## 2018-03-06 VITALS — BP 148/85 | HR 92 | Resp 18

## 2018-03-06 DIAGNOSIS — C9190 Lymphoid leukemia, unspecified not having achieved remission: Secondary | ICD-10-CM | POA: Insufficient documentation

## 2018-03-06 DIAGNOSIS — Z95828 Presence of other vascular implants and grafts: Secondary | ICD-10-CM

## 2018-03-06 DIAGNOSIS — Z452 Encounter for adjustment and management of vascular access device: Secondary | ICD-10-CM | POA: Diagnosis not present

## 2018-03-06 MED ORDER — HEPARIN SOD (PORK) LOCK FLUSH 100 UNIT/ML IV SOLN
INTRAVENOUS | Status: AC
  Filled 2018-03-06: qty 5

## 2018-03-06 MED ORDER — SODIUM CHLORIDE 0.9% FLUSH
10.0000 mL | INTRAVENOUS | Status: DC | PRN
  Administered 2018-03-06: 10 mL via INTRAVENOUS
  Filled 2018-03-06: qty 10

## 2018-03-06 MED ORDER — HEPARIN SOD (PORK) LOCK FLUSH 100 UNIT/ML IV SOLN
500.0000 [IU] | Freq: Once | INTRAVENOUS | Status: AC
  Administered 2018-03-06: 500 [IU] via INTRAVENOUS

## 2018-03-09 ENCOUNTER — Inpatient Hospital Stay: Payer: Medicare Other

## 2018-03-10 ENCOUNTER — Ambulatory Visit: Payer: 59 | Admitting: Family Medicine

## 2018-03-12 ENCOUNTER — Ambulatory Visit: Payer: 59 | Admitting: Family Medicine

## 2018-03-12 ENCOUNTER — Ambulatory Visit: Payer: 59

## 2018-03-13 ENCOUNTER — Ambulatory Visit (INDEPENDENT_AMBULATORY_CARE_PROVIDER_SITE_OTHER): Payer: Medicare Other

## 2018-03-13 VITALS — BP 132/80 | HR 90 | Temp 97.6°F | Resp 16 | Ht 71.5 in | Wt 127.4 lb

## 2018-03-13 DIAGNOSIS — Z0000 Encounter for general adult medical examination without abnormal findings: Secondary | ICD-10-CM

## 2018-03-13 DIAGNOSIS — Z1211 Encounter for screening for malignant neoplasm of colon: Secondary | ICD-10-CM

## 2018-03-13 DIAGNOSIS — Z1159 Encounter for screening for other viral diseases: Secondary | ICD-10-CM | POA: Diagnosis not present

## 2018-03-13 NOTE — Patient Instructions (Addendum)
Mr. Julian Medina , Thank you for taking time to come for your Medicare Wellness Visit. I appreciate your ongoing commitment to your health goals. Please review the following plan we discussed and let me know if I can assist you in the future.   Screening recommendations/referrals: Colonoscopy: due now - someone will call you to schedule Recommended yearly ophthalmology/optometry visit for glaucoma screening and checkup Recommended yearly dental visit for hygiene and checkup  Vaccinations: Influenza vaccine: due now- declined Pneumococcal vaccine: due now - declined Tdap vaccine: due, check with insurance for coverage Shingles vaccine: shingrix eligible, check with your insurance company for coverage  - check with your oncologist before getting  Advanced directives: Advance directive discussed with you today. Even though you declined this today please call our office should you change your mind and we can give you the proper paperwork for you to fill out.  Conditions/risks identified: Smoking cessation discussed  Next appointment: Follow up on 11/21/22019 at 1:00pm with Dr.Crissman. Follow up in one year for your annual wellness exam.   Preventive Care 71 Years and Older, Male Preventive care refers to lifestyle choices and visits with your health care provider that can promote health and wellness. What does preventive care include?  A yearly physical exam. This is also called an annual well check.  Dental exams once or twice a year.  Routine eye exams. Ask your health care provider how often you should have your eyes checked.  Personal lifestyle choices, including:  Daily care of your teeth and gums.  Regular physical activity.  Eating a healthy diet.  Avoiding tobacco and drug use.  Limiting alcohol use.  Practicing safe sex.  Taking low doses of aspirin every day.  Taking vitamin and mineral supplements as recommended by your health care provider. What happens during an  annual well check? The services and screenings done by your health care provider during your annual well check will depend on your age, overall health, lifestyle risk factors, and family history of disease. Counseling  Your health care provider may ask you questions about your:  Alcohol use.  Tobacco use.  Drug use.  Emotional well-being.  Home and relationship well-being.  Sexual activity.  Eating habits.  History of falls.  Memory and ability to understand (cognition).  Work and work Statistician. Screening  You may have the following tests or measurements:  Height, weight, and BMI.  Blood pressure.  Lipid and cholesterol levels. These may be checked every 5 years, or more frequently if you are over 84 years old.  Skin check.  Lung cancer screening. You may have this screening every year starting at age 47 if you have a 30-pack-year history of smoking and currently smoke or have quit within the past 15 years.  Fecal occult blood test (FOBT) of the stool. You may have this test every year starting at age 75.  Flexible sigmoidoscopy or colonoscopy. You may have a sigmoidoscopy every 5 years or a colonoscopy every 10 years starting at age 70.  Prostate cancer screening. Recommendations will vary depending on your family history and other risks.  Hepatitis C blood test.  Hepatitis B blood test.  Sexually transmitted disease (STD) testing.  Diabetes screening. This is done by checking your blood sugar (glucose) after you have not eaten for a while (fasting). You may have this done every 1-3 years.  Abdominal aortic aneurysm (AAA) screening. You may need this if you are a current or former smoker.  Osteoporosis. You may be screened starting  at age 73 if you are at high risk. Talk with your health care provider about your test results, treatment options, and if necessary, the need for more tests. Vaccines  Your health care provider may recommend certain vaccines,  such as:  Influenza vaccine. This is recommended every year.  Tetanus, diphtheria, and acellular pertussis (Tdap, Td) vaccine. You may need a Td booster every 10 years.  Zoster vaccine. You may need this after age 41.  Pneumococcal 13-valent conjugate (PCV13) vaccine. One dose is recommended after age 67.  Pneumococcal polysaccharide (PPSV23) vaccine. One dose is recommended after age 74. Talk to your health care provider about which screenings and vaccines you need and how often you need them. This information is not intended to replace advice given to you by your health care provider. Make sure you discuss any questions you have with your health care provider. Document Released: 07/28/2015 Document Revised: 03/20/2016 Document Reviewed: 05/02/2015 Elsevier Interactive Patient Education  2017 [place] Prevention in the Home Falls can cause injuries. They can happen to people of all ages. There are many things you can do to make your home safe and to help prevent falls. What can I do on the outside of my home?  Regularly fix the edges of walkways and driveways and fix any cracks.  Remove anything that might make you trip as you walk through a door, such as a raised step or threshold.  Trim any bushes or trees on the path to your home.  Use bright outdoor lighting.  Clear any walking paths of anything that might make someone trip, such as rocks or tools.  Regularly check to see if handrails are loose or broken. Make sure that both sides of any steps have handrails.  Any raised decks and porches should have guardrails on the edges.  Have any leaves, snow, or ice cleared regularly.  Use sand or salt on walking paths during winter.  Clean up any spills in your garage right away. This includes oil or grease spills. What can I do in the bathroom?  Use night lights.  Install grab bars by the toilet and in the tub and shower. Do not use towel bars as grab bars.  Use  non-skid mats or decals in the tub or shower.  If you need to sit down in the shower, use a plastic, non-slip stool.  Keep the floor dry. Clean up any water that spills on the floor as soon as it happens.  Remove soap buildup in the tub or shower regularly.  Attach bath mats securely with double-sided non-slip rug tape.  Do not have throw rugs and other things on the floor that can make you trip. What can I do in the bedroom?  Use night lights.  Make sure that you have a light by your bed that is easy to reach.  Do not use any sheets or blankets that are too big for your bed. They should not hang down onto the floor.  Have a firm chair that has side arms. You can use this for support while you get dressed.  Do not have throw rugs and other things on the floor that can make you trip. What can I do in the kitchen?  Clean up any spills right away.  Avoid walking on wet floors.  Keep items that you use a lot in easy-to-reach places.  If you need to reach something above you, use a strong step stool that has a grab bar.  Keep electrical cords out of the way.  Do not use floor polish or wax that makes floors slippery. If you must use wax, use non-skid floor wax.  Do not have throw rugs and other things on the floor that can make you trip. What can I do with my stairs?  Do not leave any items on the stairs.  Make sure that there are handrails on both sides of the stairs and use them. Fix handrails that are broken or loose. Make sure that handrails are as long as the stairways.  Check any carpeting to make sure that it is firmly attached to the stairs. Fix any carpet that is loose or worn.  Avoid having throw rugs at the top or bottom of the stairs. If you do have throw rugs, attach them to the floor with carpet tape.  Make sure that you have a light switch at the top of the stairs and the bottom of the stairs. If you do not have them, ask someone to add them for you. What  else can I do to help prevent falls?  Wear shoes that:  Do not have high heels.  Have rubber bottoms.  Are comfortable and fit you well.  Are closed at the toe. Do not wear sandals.  If you use a stepladder:  Make sure that it is fully opened. Do not climb a closed stepladder.  Make sure that both sides of the stepladder are locked into place.  Ask someone to hold it for you, if possible.  Clearly mark and make sure that you can see:  Any grab bars or handrails.  First and last steps.  Where the edge of each step is.  Use tools that help you move around (mobility aids) if they are needed. These include:  Canes.  Walkers.  Scooters.  Crutches.  Turn on the lights when you go into a dark area. Replace any light bulbs as soon as they burn out.  Set up your furniture so you have a clear path. Avoid moving your furniture around.  If any of your floors are uneven, fix them.  If there are any pets around you, be aware of where they are.  Review your medicines with your doctor. Some medicines can make you feel dizzy. This can increase your chance of falling. Ask your doctor what other things that you can do to help prevent falls. This information is not intended to replace advice given to you by your health care provider. Make sure you discuss any questions you have with your health care provider. Document Released: 04/27/2009 Document Revised: 12/07/2015 Document Reviewed: 08/05/2014 Elsevier Interactive Patient Education  2017 [employer].   Steps to Quit Smoking Smoking tobacco can be bad for your health. It can also affect almost every organ in your body. Smoking puts you and people around you at risk for many serious long-lasting (chronic) diseases. Quitting smoking is hard, but it is one of the best things that you can do for your health. It is never too late to quit. What are the benefits of quitting smoking? When you quit smoking, you lower your risk for  getting serious diseases and conditions. They can include:  Lung cancer or lung disease.  Heart disease.  Stroke.  Heart attack.  Not being able to have children (infertility).  Weak bones (osteoporosis) and broken bones (fractures).  If you have coughing, wheezing, and shortness of breath, those symptoms may get better when you quit. You may also get  sick less often. If you are pregnant, quitting smoking can help to lower your chances of having a baby of low birth weight. What can I do to help me quit smoking? Talk with your doctor about what can help you quit smoking. Some things you can do (strategies) include:  Quitting smoking totally, instead of slowly cutting back how much you smoke over a period of time.  Going to in-person counseling. You are more likely to quit if you go to many counseling sessions.  Using resources and support systems, such as: ? Database administrator with a Social worker. ? Phone quitlines. ? Careers information officer. ? Support groups or group counseling. ? Text messaging programs. ? Mobile phone apps or applications.  Taking medicines. Some of these medicines may have nicotine in them. If you are pregnant or breastfeeding, do not take any medicines to quit smoking unless your doctor says it is okay. Talk with your doctor about counseling or other things that can help you.  Talk with your doctor about using more than one strategy at the same time, such as taking medicines while you are also going to in-person counseling. This can help make quitting easier. What things can I do to make it easier to quit? Quitting smoking might feel very hard at first, but there is a lot that you can do to make it easier. Take these steps:  Talk to your family and friends. Ask them to support and encourage you.  Call phone quitlines, reach out to support groups, or work with a Social worker.  Ask people who smoke to not smoke around you.  Avoid places that make you want  (trigger) to smoke, such as: ? Bars. ? Parties. ? Smoke-break areas at work.  Spend time with people who do not smoke.  Lower the stress in your life. Stress can make you want to smoke. Try these things to help your stress: ? Getting regular exercise. ? Deep-breathing exercises. ? Yoga. ? Meditating. ? Doing a body scan. To do this, close your eyes, focus on one area of your body at a time from head to toe, and notice which parts of your body are tense. Try to relax the muscles in those areas.  Download or buy apps on your mobile phone or tablet that can help you stick to your quit plan. There are many free apps, such as QuitGuide from the [employer] Office manager for Disease Control and Prevention). You can find more support from smokefree.gov and other websites.  This information is not intended to replace advice given to you by your health care provider. Make sure you discuss any questions you have with your health care provider. Document Released: 04/27/2009 Document Revised: 02/27/2016 Document Reviewed: 11/15/2014 Elsevier Interactive Patient Education  2018 [employer].

## 2018-03-13 NOTE — Progress Notes (Signed)
Subjective:   Julian Medina is a 72 y.o. male who presents for Medicare Annual/Subsequent preventive examination.  Review of Systems:   Cardiac Risk Factors include: hypertension;male gender;advanced age (>84men, >45 women);smoking/ tobacco exposure     Objective:    Vitals: BP 132/80 (BP Location: Left Arm, Patient Position: Sitting)   Pulse 90   Temp 97.6 F (36.4 C) (Temporal)   Resp 16   Ht 5' 11.5" (1.816 m)   Wt 127 lb 6.4 oz (57.8 kg)   SpO2 98%   BMI 17.52 kg/m   Body mass index is 17.52 kg/m.  Advanced Directives 03/13/2018 01/26/2018 11/24/2017 10/29/2017 07/29/2017 04/24/2017 03/27/2017  Does Patient Have a Medical Advance Directive? No No No No No No No  Would patient like information on creating a medical advance directive? Yes (MAU/Ambulatory/Procedural Areas - Information given) - - No - Patient declined No - Patient declined - -    Tobacco Social History   Tobacco Use  Smoking Status Current Every Day Smoker  . Packs/day: 1.00  . Years: 40.00  . Pack years: 40.00  . Types: Cigarettes  Smokeless Tobacco Never Used     Ready to quit: No Counseling given: Yes   Clinical Intake:  Pre-visit preparation completed: Yes  Pain : No/denies pain     Nutritional Status: BMI <19  Underweight Nutritional Risks: None Diabetes: No  How often do you need to have someone help you when you read instructions, pamphlets, or other written materials from your doctor or pharmacy?: 1 - Never What is the last grade level you completed in school?: 9th grade  Interpreter Needed?: No  Information entered by :: Tiffany Hill,LPN   Past Medical History:  Diagnosis Date  . Anxiety   . CLL (chronic lymphocytic leukemia) ([place])   . COPD (chronic obstructive pulmonary disease) ([place])   . Hypertension    Past Surgical History:  Procedure Laterality Date  . HERNIA REPAIR    . PERIPHERAL VASCULAR CATHETERIZATION N/A 03/14/2016   Procedure: Glori Luis Cath Insertion;  Surgeon:  Algernon Huxley, MD;  Location: [facility];  Service: Cardiovascular;  Laterality: N/A;  . ROBOTIC ASSITED PARTIAL NEPHRECTOMY Left 11/14/2015   Procedure: ROBOTIC ASSITED PARTIAL NEPHRECTOMY with intraoperative drop in ultrasound / Extensive Lysis of adhesions;  Surgeon: [name], MD;  Location: [facility];  Service: Urology;  Laterality: Left;   Family History  Problem Relation Age of Onset  . Hearing loss Brother    Social History   Socioeconomic History  . Marital status: Married    Spouse name: Not on file  . Number of children: Not on file  . Years of education: Not on file  . Highest education level: 9th grade  Occupational History  . Not on file  Social Needs  . Financial resource strain: Not hard at all  . Food insecurity:    Worry: Never true    Inability: Never true  . Transportation needs:    Medical: No    Non-medical: No  Tobacco Use  . Smoking status: Current Every Day Smoker    Packs/day: 1.00    Years: 40.00    Pack years: 40.00    Types: Cigarettes  . Smokeless tobacco: Never Used  Substance and Sexual Activity  . Alcohol use: Yes    Alcohol/week: 7.0 standard drinks    Types: 7 Cans of beer per week  . Drug use: No  . Sexual activity: Not on file  Lifestyle  . Physical  activity:    Days per week: 0 days    Minutes per session: 0 min  . Stress: Not at all  Relationships  . Social connections:    Talks on phone: More than three times a week    Gets together: More than three times a week    Attends religious service: More than 4 times per year    Active member of club or organization: No    Attends meetings of clubs or organizations: Never    Relationship status: Married  Other Topics Concern  . Not on file  Social History Narrative   Works 32 hours a week     Outpatient Encounter Medications as of 03/13/2018  Medication Sig  . ADVAIR DISKUS 250-50 MCG/DOSE AEPB INHALE 1 PUFF 2 TIMES A DAY  . albuterol (PROVENTIL HFA;VENTOLIN HFA)  108 (90 Base) MCG/ACT inhaler Inhale 2 puffs into the lungs every 6 (six) hours as needed for wheezing or shortness of breath.  Marland Kitchen amLODipine (NORVASC) 10 MG tablet TAKE 1 TABLET BY MOUTH EVERY DAY  . benazepril (LOTENSIN) 40 MG tablet 1 TABLET ONCE EACH DAY ORAL  . lidocaine-prilocaine (EMLA) cream Apply 1 application topically as needed.  Marland Kitchen LORazepam (ATIVAN) 1 MG tablet Take 1 tablet (1 mg total) by mouth daily as needed for anxiety.  . megestrol (MEGACE) 40 MG tablet Take 1 tablet (40 mg total) by mouth daily.  . metoprolol succinate (TOPROL-XL) 25 MG 24 hr tablet TAKE 1 TABLET BY MOUTH EVERY DAY (Patient not taking: Reported on 03/13/2018)  . pantoprazole (PROTONIX) 40 MG tablet Take 1 tablet (40 mg total) by mouth daily. (Patient not taking: Reported on 03/13/2018)   Facility-Administered Encounter Medications as of 03/13/2018  Medication  . sodium chloride flush (NS) 0.9 % injection 10 mL    Activities of Daily Living In your present state of health, do you have any difficulty performing the following activities: 03/13/2018  Hearing? N  Vision? N  Difficulty concentrating or making decisions? N  Walking or climbing stairs? N  Dressing or bathing? N  Doing errands, shopping? N  Preparing Food and eating ? N  Using the Toilet? N  In the past six months, have you accidently leaked urine? N  Do you have problems with loss of bowel control? N  Managing your Medications? N  Managing your Finances? N  Housekeeping or managing your Housekeeping? N  Some recent data might be hidden    Patient Care Team: Guadalupe Maple, MD as PCP - General (Family Medicine) Lloyd Huger, MD as Consulting Physician (Oncology) [name], MD as Consulting Physician (Urology)   Assessment:   This is a routine wellness examination for Julian Medina.  Exercise Activities and Dietary recommendations Current Exercise Habits: The patient does not participate in regular exercise at present, Exercise  limited by: None identified  Goals    . Quit Smoking     Smoking cessation discussed       Fall Risk Fall Risk  03/13/2018 01/21/2017 12/18/2016 05/06/2016 05/04/2015  Falls in the past year? No No No No No   Is the patient's home free of loose throw rugs in walkways, pet beds, electrical cords, etc?   yes      Grab bars in the bathroom? no      Handrails on the stairs?   yes      Adequate lighting?   yes  Timed Get Up and Go Performed: Completed in 8 seconds with no use of assistive devices,  steady gait. No intervention needed at this time.   Depression Screen PHQ 2/9 Scores 03/13/2018 01/21/2017 12/18/2016 05/06/2016  PHQ - 2 Score 0 0 0 0  PHQ- 9 Score - - - -    Cognitive Function     6CIT Screen 03/13/2018  What Year? 0 points  What month? 0 points  What time? 0 points  Count back from 20 0 points  Months in reverse 0 points  Repeat phrase 0 points  Total Score 0    Declined all vaccines   There is no immunization history on file for this patient.  Qualifies for Shingles Vaccine?  Yes, discussed shingrix vaccine   Screening Tests Health Maintenance  Topic Date Due  . Hepatitis C Screening  03/21/1946  . COLONOSCOPY  08/30/2007  . INFLUENZA VACCINE  02/12/2018  . TETANUS/TDAP  03/14/2019 (Originally 03/17/1965)  . PNA vac Low Risk Adult (1 of 2 - PCV13) 03/14/2019 (Originally 03/17/2012)   Cancer Screenings: Lung: Low Dose CT Chest recommended if Age 25-80 years, 30 pack-year currently smoking OR have quit w/in 15years. Patient does qualify. Had CT scan  07/24/2017 Colorectal: declined  Additional Screenings:  Hepatitis C Screening: due will order for future labs      Plan:    I have personally reviewed and addressed the Medicare Annual Wellness questionnaire and have noted the following in the patient's chart:  A. Medical and social history B. Use of alcohol, tobacco or illicit drugs  C. Current medications and supplements D. Functional ability and  status E.  Nutritional status F.  Physical activity G. Advance directives H. List of other physicians I.  Hospitalizations, surgeries, and ER visits in previous 12 months J.  [place] such as hearing and vision if needed, cognitive and depression L. Referrals and appointments   In addition, I have reviewed and discussed with patient certain preventive protocols, quality metrics, and best practice recommendations. A written personalized care plan for preventive services as well as general preventive health recommendations were provided to patient.   Signed,  Tyler Aas, LPN Nurse Health Advisor   Nurse Notes:none

## 2018-03-14 ENCOUNTER — Other Ambulatory Visit: Payer: Self-pay | Admitting: Family Medicine

## 2018-03-14 DIAGNOSIS — I10 Essential (primary) hypertension: Secondary | ICD-10-CM

## 2018-03-15 ENCOUNTER — Other Ambulatory Visit: Payer: Self-pay | Admitting: Family Medicine

## 2018-03-15 DIAGNOSIS — J42 Unspecified chronic bronchitis: Secondary | ICD-10-CM

## 2018-03-17 NOTE — Telephone Encounter (Signed)
Amlodipine 10 mg refill Last Refill:12/22/17 # 90 Last OV: 10/20/17 PCP: Golden Pop, MD Pharmacy: [facility] # [phone]

## 2018-03-30 ENCOUNTER — Encounter: Payer: Self-pay | Admitting: *Deleted

## 2018-03-30 ENCOUNTER — Ambulatory Visit: Payer: Self-pay | Admitting: *Deleted

## 2018-03-30 ENCOUNTER — Other Ambulatory Visit: Payer: Self-pay | Admitting: Oncology

## 2018-03-30 NOTE — Telephone Encounter (Signed)
Pt having complains of head feeling "stopped up" and coughing since last week. Pt also states she had a runny nose, productive cough with yellow sputum and congestion. Pt states he has a history of COPD and has been using his 2 inhalers and benadryl with little relief of symptoms. No appt availability for the pt to be seen in the office this week. Pt advised to seek care in the ED/Urgent Care. Pt verbalized understanding.   Reason for Disposition . [1] Longstanding difficulty breathing AND [2] not responding to usual therapy  Answer Assessment - Initial Assessment Questions 1. RESPIRATORY STATUS: "Describe your breathing?" (e.g., wheezing, shortness of breath, unable to speak, severe coughing)      Short of breath 2. ONSET: "When did this breathing problem begin?"      Last week  3. PATTERN "Does the difficult breathing come and go, or has it been constant since it started?"      Comes and goes 4. SEVERITY: "How bad is your breathing?" (e.g., mild, moderate, severe)    - MILD: No SOB at rest, mild SOB with walking, speaks normally in sentences, can lay down, no retractions, pulse < 100.    - MODERATE: SOB at rest, SOB with minimal exertion and prefers to sit, cannot lie down flat, speaks in phrases, mild retractions, audible wheezing, pulse 100-120.    - SEVERE: Very SOB at rest, speaks in single words, struggling to breathe, sitting hunched forward, retractions, pulse > 120      Walking short distance out of breath, mild 5. RECURRENT SYMPTOM: "Have you had difficulty breathing before?" If so, ask: "When was the last time?" and "What happened that time?"      Yes happens every year 6. CARDIAC HISTORY: "Do you have any history of heart disease?" (e.g., heart attack, angina, bypass surgery, angioplasty)      No 7. LUNG HISTORY: "Do you have any history of lung disease?"  (e.g., pulmonary embolus, asthma, emphysema)     COPD 8. CAUSE: "What do you think is causing the breathing problem?"   Happens every year 9. OTHER SYMPTOMS: "Do you have any other symptoms? (e.g., dizziness, runny nose, cough, chest pain, fever)     Runny nose and cough 10. PREGNANCY: "Is there any chance you are pregnant?" "When was your last menstrual period?"       No 11. TRAVEL: "Have you traveled out of the country in the last month?" (e.g., travel history, exposures)       No  Protocols used: BREATHING DIFFICULTY-A-AH

## 2018-03-31 ENCOUNTER — Ambulatory Visit (INDEPENDENT_AMBULATORY_CARE_PROVIDER_SITE_OTHER): Payer: Medicare Other | Admitting: Family Medicine

## 2018-03-31 ENCOUNTER — Encounter: Payer: Self-pay | Admitting: Family Medicine

## 2018-03-31 ENCOUNTER — Other Ambulatory Visit: Payer: Self-pay

## 2018-03-31 VITALS — BP 119/73 | HR 97 | Temp 97.5°F | Ht 71.0 in | Wt 130.0 lb

## 2018-03-31 DIAGNOSIS — J42 Unspecified chronic bronchitis: Secondary | ICD-10-CM | POA: Diagnosis not present

## 2018-03-31 DIAGNOSIS — J069 Acute upper respiratory infection, unspecified: Secondary | ICD-10-CM | POA: Diagnosis not present

## 2018-03-31 MED ORDER — HYDROCOD POLST-CPM POLST ER 10-8 MG/5ML PO SUER: 5.0000 mL | Freq: Two times a day (BID) | ORAL | 0 refills | Status: DC | PRN

## 2018-03-31 MED ORDER — AZITHROMYCIN 250 MG PO TABS: ORAL_TABLET | ORAL | 0 refills | Status: DC

## 2018-03-31 MED ORDER — PREDNISONE 10 MG PO TABS
ORAL_TABLET | ORAL | 0 refills | Status: DC
Start: 2018-03-31 — End: 2018-06-04

## 2018-03-31 MED ORDER — ALBUTEROL SULFATE (2.5 MG/3ML) 0.083% IN NEBU: 2.5000 mg | INHALATION_SOLUTION | Freq: Once | RESPIRATORY_TRACT | Status: AC

## 2018-03-31 NOTE — Telephone Encounter (Signed)
Julian Medina routed an FYI to [employer] and stated that he said he'd go to UC/ED at 9:16 this morning.

## 2018-03-31 NOTE — Progress Notes (Signed)
BP 119/73   Pulse 97   Temp (!) 97.5 F (36.4 C) (Oral)   Ht 5\' 11"  (1.803 m)   Wt 130 lb (59 kg)   SpO2 99%   BMI 18.13 kg/m    Subjective:    Patient ID: Julian Medina, male    DOB: 1945-12-14, 72 y.o.   MRN: [id number]  HPI: Julian Medina is a 72 y.o. male  Chief Complaint  Patient presents with  . Nasal Congestion    x 1 week/ pt states have been taking  OTC benadryl   . Cough   Here today for over a week of congestion, productive cough, wheezing, chest tightness, SOB. Has been taking benadryl at bedtime with no relief, albuterol and advair helping some. Hx of COPD, seems to get flares 1-2 times annually like this. Denies fevers, CP, ear pain, N/V/D.   Relevant past medical, surgical, family and social history reviewed and updated as indicated. Interim medical history since our last visit reviewed. Allergies and medications reviewed and updated.  Review of Systems  Per HPI unless specifically indicated above     Objective:    BP 119/73   Pulse 97   Temp (!) 97.5 F (36.4 C) (Oral)   Ht 5\' 11"  (1.803 m)   Wt 130 lb (59 kg)   SpO2 99%   BMI 18.13 kg/m   Wt Readings from Last 3 Encounters:  03/31/18 130 lb (59 kg)  03/13/18 127 lb 6.4 oz (57.8 kg)  01/26/18 127 lb 2 oz (57.7 kg)    Physical Exam  Constitutional: He is oriented to person, place, and time. He appears well-developed and well-nourished. No distress.  HENT:  Head: Atraumatic.  Right Ear: External ear normal.  Left Ear: External ear normal.  Nose: Nose normal.  Oropharynx erythematous  Eyes: Conjunctivae and EOM are normal.  Neck: Normal range of motion. Neck supple.  Cardiovascular: Normal rate and regular rhythm.  Pulmonary/Chest: Effort normal. He has wheezes. He has no rales.  Musculoskeletal: Normal range of motion.  Neurological: He is alert and oriented to person, place, and time.  Skin: Skin is warm and dry.  Nursing note and vitals reviewed.   Results for orders placed or  performed in visit on 01/26/18  Comprehensive metabolic panel  Result Value Ref Range   Sodium 134 (L) 135 - 145 mmol/L   Potassium 4.1 3.5 - 5.1 mmol/L   Chloride 105 98 - 111 mmol/L   CO2 21 (L) 22 - 32 mmol/L   Glucose, Bld 106 (H) 70 - 99 mg/dL   BUN 21 8 - 23 mg/dL   Creatinine, Ser 1.80 (H) 0.61 - 1.24 mg/dL   Calcium 9.2 8.9 - 10.3 mg/dL   Total Protein 6.6 6.5 - 8.1 g/dL   Albumin 4.2 3.5 - 5.0 g/dL   AST 18 15 - 41 U/L   ALT 9 0 - 44 U/L   Alkaline Phosphatase 73 38 - 126 U/L   Total Bilirubin 0.4 0.3 - 1.2 mg/dL   GFR calc non Af Amer 36 (L) >60 mL/min   GFR calc Af Amer 42 (L) >60 mL/min   Anion gap 8 5 - 15  CBC with Differential/Platelet  Result Value Ref Range   WBC 10.7 (H) 3.8 - 10.6 K/uL   RBC 3.75 (L) 4.40 - 5.90 MIL/uL   Hemoglobin 11.3 (L) 13.0 - 18.0 g/dL   HCT 32.8 (L) 40.0 - 52.0 %   MCV 87.5 80.0 - 100.0  fL   MCH 30.1 26.0 - 34.0 pg   MCHC 34.3 32.0 - 36.0 g/dL   RDW 16.4 (H) 11.5 - 14.5 %   Platelets 251 150 - 440 K/uL   Neutrophils Relative % 59 %   Lymphocytes Relative 31 %   Monocytes Relative 5 %   Eosinophils Relative 5 %   Basophils Relative 0 %   Band Neutrophils 0 %   Metamyelocytes Relative 0 %   Myelocytes 0 %   Promyelocytes Relative 0 %   Blasts 0 %   nRBC 0 0 /100 WBC   Other 0 %   Neutro Abs 6.4 1.4 - 6.5 K/uL   Lymphs Abs 3.3 1.0 - 3.6 K/uL   Monocytes Absolute 0.5 0.2 - 1.0 K/uL   Eosinophils Absolute 0.5 0 - 0.7 K/uL   Basophils Absolute 0.0 0 - 0.1 K/uL   RBC Morphology MIXED RBC POPULATION    WBC Morphology SMUDGE CELLS    Smear Review PLATELETS APPEAR ADEQUATE       Assessment & Plan:   Problem List Items Addressed This Visit      Respiratory   COPD (chronic obstructive pulmonary disease) ([place])    With acute exacerbation from URI. Nebulizer tx given today, will start prednisone taper, azithromycin, and tussionex. Sedation precautions and OTC supportive care reviewed      Relevant Medications   azithromycin  (ZITHROMAX) 250 MG tablet   predniSONE (DELTASONE) 10 MG tablet   chlorpheniramine-HYDROcodone (TUSSIONEX PENNKINETIC ER) 10-8 MG/5ML SUER   albuterol (PROVENTIL) (2.5 MG/3ML) 0.083% nebulizer solution 2.5 mg    Other Visit Diagnoses    Upper respiratory tract infection, unspecified type    -  Primary   Tx with azithromycin, prednisone, and tussionex. Can take plain mucinex, try humidifiers, rest, push fluids. Return precautions reviewed   Relevant Medications   azithromycin (ZITHROMAX) 250 MG tablet       Follow up plan: Return for as scheduled.

## 2018-04-03 NOTE — Assessment & Plan Note (Signed)
With acute exacerbation from URI. Nebulizer tx given today, will start prednisone taper, azithromycin, and tussionex. Sedation precautions and OTC supportive care reviewed

## 2018-04-06 IMAGING — CT CT NECK W/ CM
2 of 3 series · 8 of 14 positions shown, 10 images · IV contrast (iopamidol)
Comparison: Neck CT 05/16/2014.

CLINICAL DATA: 69-year-old male with chronic lymphocytic leukemia.
Enlarging axillary lymph nodes. Restaging. Subsequent encounter.

EXAM:
CT NECK WITH CONTRAST
TECHNIQUE: Multidetector CT imaging of the neck was performed using the
standard protocol following the bolus administration of intravenous
contrast.
CONTRAST:  100mL FEUI7Q-SAA IOPAMIDOL (FEUI7Q-SAA) INJECTION 61% in
conjunction with contrast enhanced imaging of the chest, abdomen,
and pelvis reported separately.

[Series 3: axial neck · axial · 0.55mm/px · z∈[-434,-308]mm · 3 of 127 slices shown]
[im 32/127  bone]
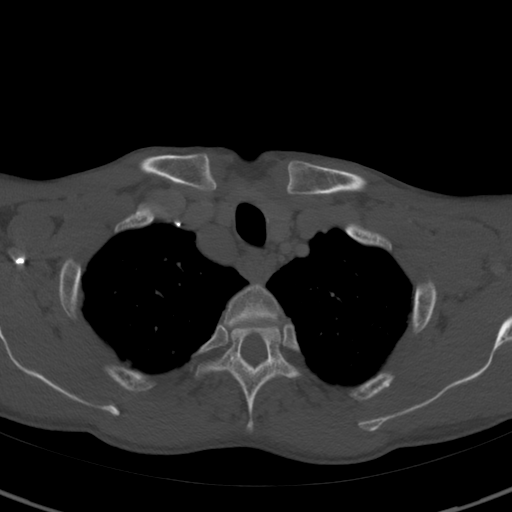
[im 64/127  bone]
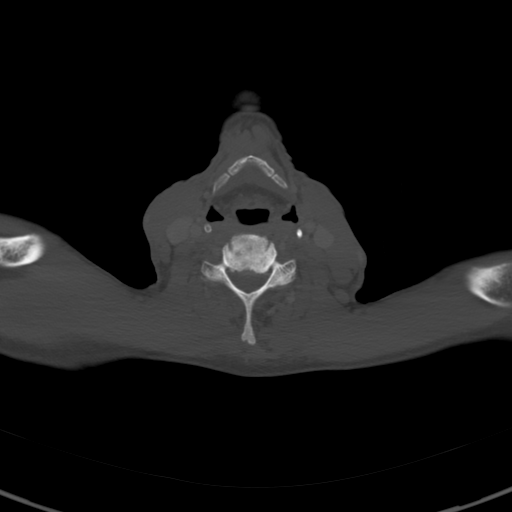
[im 95/127  bone]
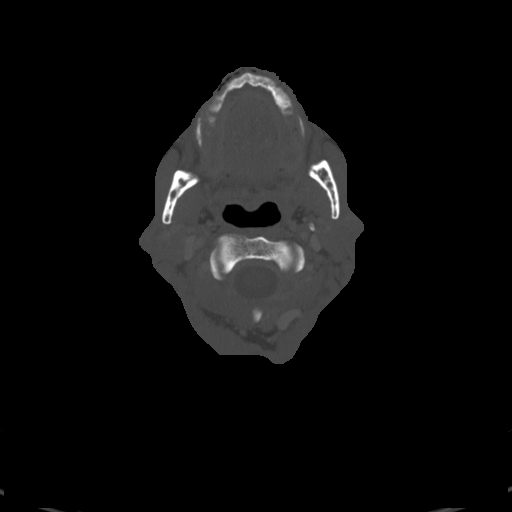

[Series 8: orthogonal ax · axial · 0.44mm/px · z∈[-522,-318]mm · 5 of 164 slices shown, 7 images]
[im 28/164  soft-tissue]
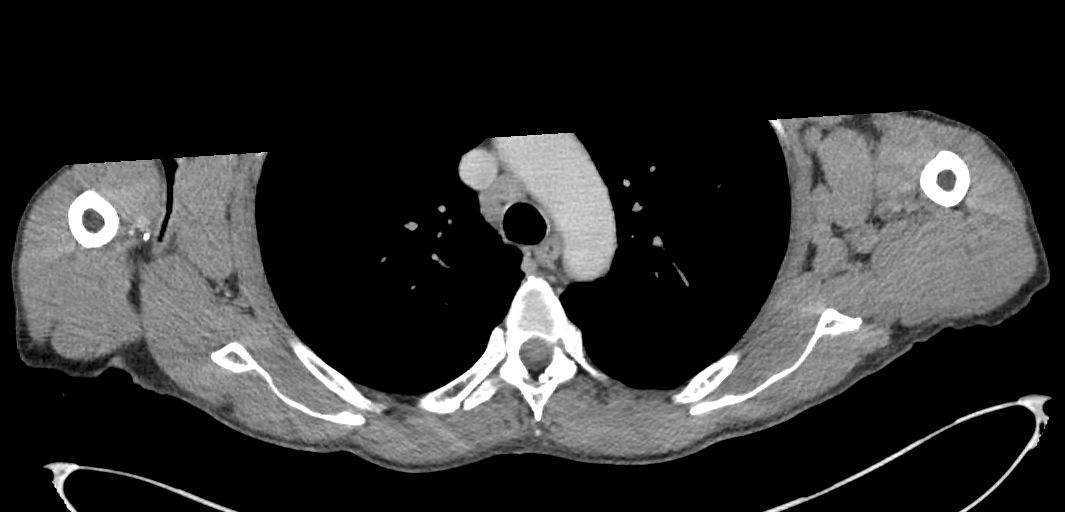
[im 28/164  bone]
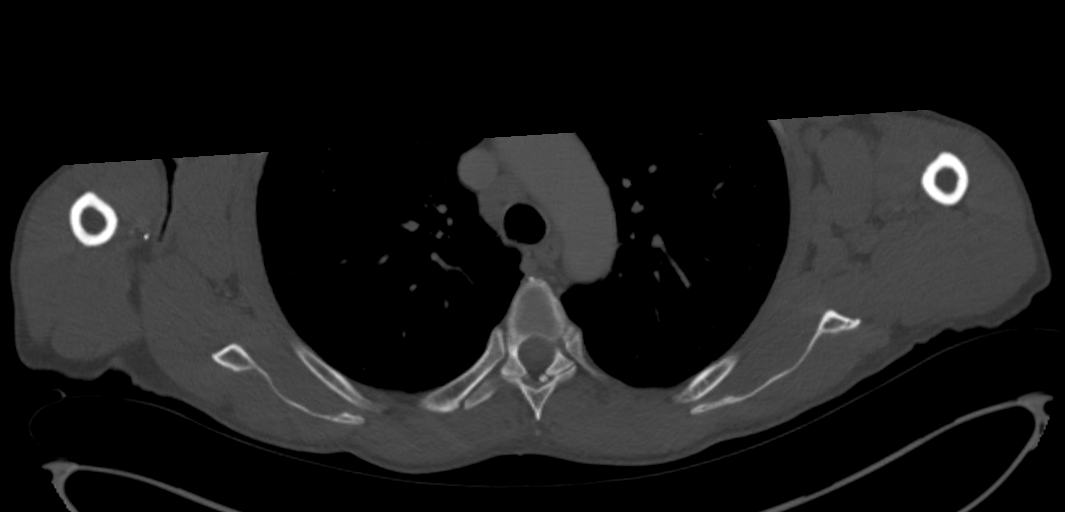
[im 55/164  bone]
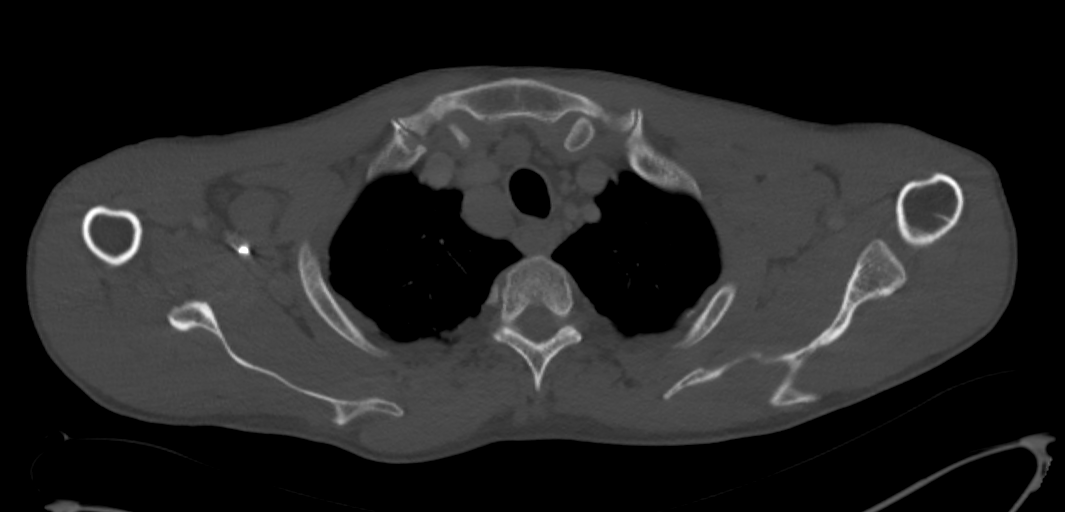
[im 82/164  bone]
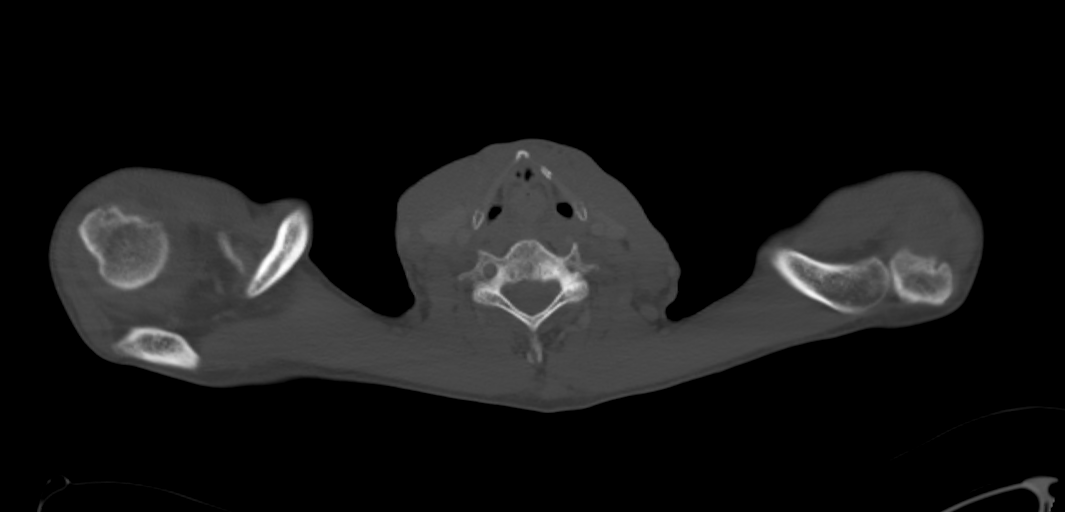
[im 109/164  bone]
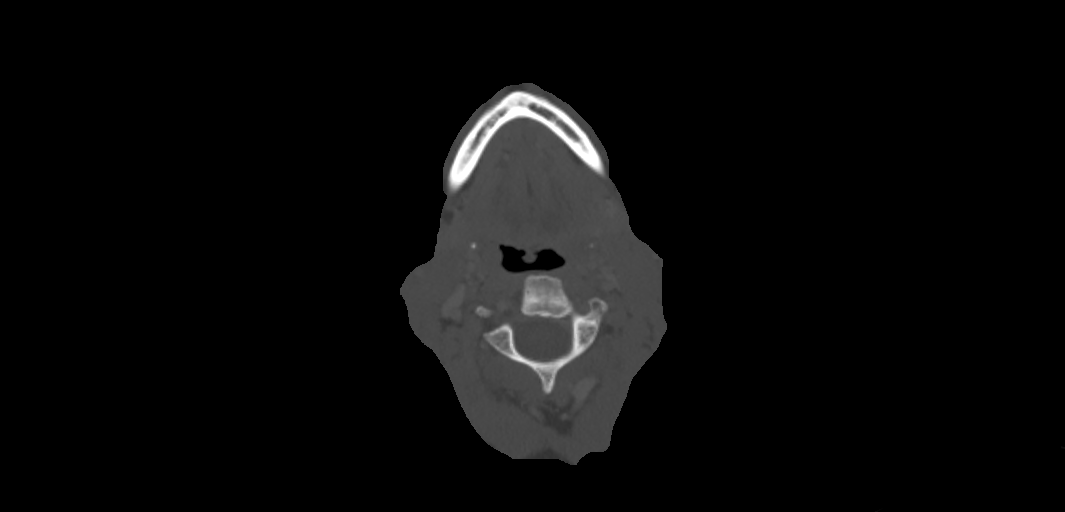
[im 136/164  soft-tissue]
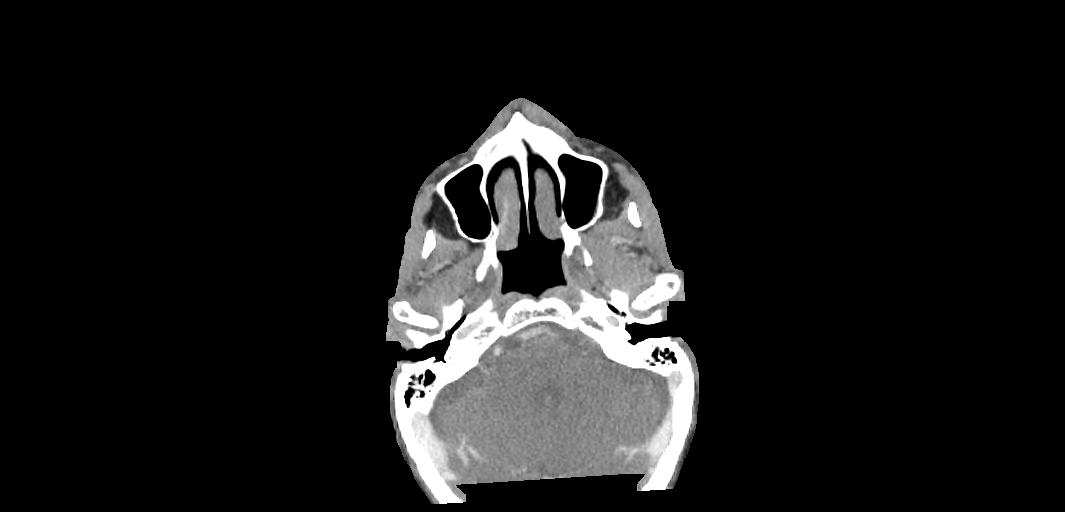
[im 136/164  bone]
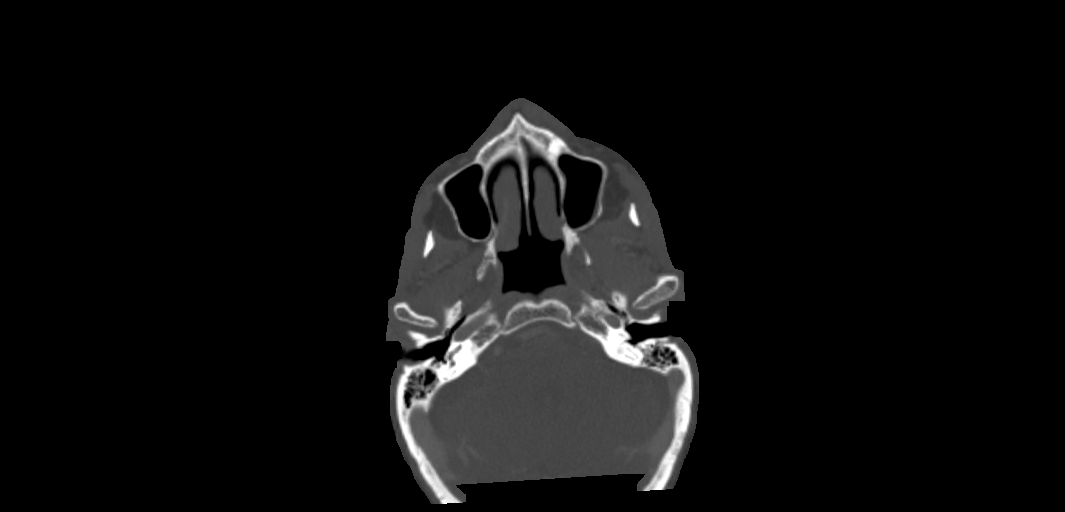

[8 of 14 positions shown; findings below may reference images not displayed]

FINDINGS: Pharynx and larynx: The glottis is closed. Subsequently the soft
tissue at the area epiglottic folds is opposed and appears somewhat
prominent series 3, image 69).

The hypopharynx, oropharynx and nasopharynx appear stable and
normal. Negative parapharyngeal and retropharyngeal spaces.

Salivary glands: Negative sublingual space, submandibular glands and
parotid glands.

Thyroid: Stable, negative for age.

Lymph nodes: Diffuse enlargement of bilateral cervical lymph nodes
at nearly all nodal stations since 3962. Nodes appear abnormally
increased in size and number now throughout the bilateral neck, but
worse on the left. Individual nodes measure up to 17 mm 18 mm short
axis, bilateral level 2 and left level 3). Conglomerate left level 2
nodal mass measures a little larger than 5 cm in total. Left level 1
B nodes now measure up to 9 mm short axis (previously 4 mm or less).
No cystic or necrotic nodes identified.

Lymphadenopathy continues into the upper chest.

Vascular: Major vascular structures in the neck and at the skullbase
remain patent, although both internal jugular veins are effaced
secondary to the lymphadenopathy. Calcified atherosclerosis at the
skull base.

Limited intracranial: Negative.

Chronic cystic appearing enlargement of the left ear lobe
incidentally re- identified.

Visualized orbits: Negative.

Mastoids and visualized paranasal sinuses: Visualized paranasal
sinuses and mastoids are stable and well pneumatized.

Skeleton: Absent dentition. Degenerative changes in the cervical
spine. No acute or suspicious osseous lesion identified in the neck.

Upper chest: Lymphadenopathy, reported separately today.
IMPRESSION: 1. Generalized bilateral neck lymphadenopathy, new since 3962 and
worse on the left.
2. Lymphadenopathy continues into the upper chest. CT chest abdomen
and pelvis today are reported separately.

## 2018-04-11 ENCOUNTER — Other Ambulatory Visit: Payer: Self-pay | Admitting: Family Medicine

## 2018-04-11 DIAGNOSIS — J42 Unspecified chronic bronchitis: Secondary | ICD-10-CM

## 2018-04-15 ENCOUNTER — Other Ambulatory Visit: Payer: Self-pay | Admitting: Family Medicine

## 2018-04-15 NOTE — Telephone Encounter (Signed)
Requested medication (s) are due for refill today no  Requested medication (s) are on the active medication list no  Future visit scheduled yes   Requested Prescriptions  Pending Prescriptions Disp Refills   pantoprazole (PROTONIX) 40 MG tablet [Pharmacy Med Name: PANTOPRAZOLE SOD DR 40 MG TAB] 90 tablet 1    Sig: TAKE 1 TABLET BY MOUTH EVERY DAY     Gastroenterology: Proton Pump Inhibitors Passed - 04/15/2018  1:55 AM      Passed - Valid encounter within last 12 months    Recent Outpatient Visits          2 weeks ago Upper respiratory tract infection, unspecified type   [facility] [place], [place]   5 months ago Infected sebaceous cyst   [facility] [place], [place]   6 months ago Infected sebaceous cyst   [facility], [place]   1 year ago Essential hypertension   [place], Jeannette How, [place]   1 year ago Chronic bronchitis, unspecified chronic bronchitis type [facility])   [facility], [place]      Future Appointments            In 1 month [place], Jeannette How, MD [place], [facility]   In 54 months  [employer], [place]

## 2018-04-17 ENCOUNTER — Inpatient Hospital Stay: Payer: Medicare Other | Attending: Oncology

## 2018-04-17 ENCOUNTER — Inpatient Hospital Stay: Payer: Medicare Other

## 2018-04-17 DIAGNOSIS — K4090 Unilateral inguinal hernia, without obstruction or gangrene, not specified as recurrent: Secondary | ICD-10-CM | POA: Insufficient documentation

## 2018-04-17 DIAGNOSIS — C9110 Chronic lymphocytic leukemia of B-cell type not having achieved remission: Secondary | ICD-10-CM

## 2018-04-17 DIAGNOSIS — D649 Anemia, unspecified: Secondary | ICD-10-CM | POA: Diagnosis not present

## 2018-04-17 DIAGNOSIS — Z85528 Personal history of other malignant neoplasm of kidney: Secondary | ICD-10-CM | POA: Diagnosis not present

## 2018-04-17 DIAGNOSIS — F17210 Nicotine dependence, cigarettes, uncomplicated: Secondary | ICD-10-CM | POA: Insufficient documentation

## 2018-04-17 DIAGNOSIS — I700 Atherosclerosis of aorta: Secondary | ICD-10-CM | POA: Diagnosis not present

## 2018-04-17 DIAGNOSIS — J449 Chronic obstructive pulmonary disease, unspecified: Secondary | ICD-10-CM | POA: Diagnosis not present

## 2018-04-17 DIAGNOSIS — Z79899 Other long term (current) drug therapy: Secondary | ICD-10-CM | POA: Insufficient documentation

## 2018-04-17 DIAGNOSIS — Z905 Acquired absence of kidney: Secondary | ICD-10-CM | POA: Diagnosis not present

## 2018-04-17 DIAGNOSIS — F419 Anxiety disorder, unspecified: Secondary | ICD-10-CM | POA: Diagnosis not present

## 2018-04-17 DIAGNOSIS — I10 Essential (primary) hypertension: Secondary | ICD-10-CM | POA: Insufficient documentation

## 2018-04-17 DIAGNOSIS — N2889 Other specified disorders of kidney and ureter: Secondary | ICD-10-CM | POA: Insufficient documentation

## 2018-04-17 DIAGNOSIS — Z5112 Encounter for antineoplastic immunotherapy: Secondary | ICD-10-CM | POA: Diagnosis not present

## 2018-04-17 LAB — COMPREHENSIVE METABOLIC PANEL
ALT: 11 U/L (ref 0–44)
AST: 17 U/L (ref 15–41)
Albumin: 4.1 g/dL (ref 3.5–5.0)
Alkaline Phosphatase: 53 U/L (ref 38–126)
Anion gap: 8 (ref 5–15)
BILIRUBIN TOTAL: 0.7 mg/dL (ref 0.3–1.2)
BUN: 26 mg/dL — AB (ref 8–23)
CO2: 18 mmol/L — ABNORMAL LOW (ref 22–32)
CREATININE: 2.08 mg/dL — AB (ref 0.61–1.24)
Calcium: 9 mg/dL (ref 8.9–10.3)
Chloride: 111 mmol/L (ref 98–111)
GFR, EST AFRICAN AMERICAN: 35 mL/min — AB (ref >60)
GFR, EST NON AFRICAN AMERICAN: 30 mL/min — AB (ref >60)
Glucose, Bld: 85 mg/dL (ref 70–99)
Potassium: 4.3 mmol/L (ref 3.5–5.1)
Sodium: 137 mmol/L (ref 135–145)
TOTAL PROTEIN: 6.8 g/dL (ref 6.5–8.1)

## 2018-04-17 LAB — CBC WITH DIFFERENTIAL/PLATELET
BAND NEUTROPHILS: 0 %
BASOS ABS: 0 10*3/uL (ref 0–0.1)
Basophils Relative: 0 %
Blasts: 0 %
EOS ABS: 0.6 10*3/uL (ref 0–0.7)
EOS PCT: 3 %
HCT: 27.8 % — ABNORMAL LOW (ref 40.0–52.0)
Hemoglobin: 9.6 g/dL — ABNORMAL LOW (ref 13.0–18.0)
Lymphocytes Relative: 55 %
Lymphs Abs: 11.7 10*3/uL — ABNORMAL HIGH (ref 1.0–3.6)
MCH: 29.9 pg (ref 26.0–34.0)
MCHC: 34.3 g/dL (ref 32.0–36.0)
MCV: 87.2 fL (ref 80.0–100.0)
Metamyelocytes Relative: 0 %
Monocytes Absolute: 1.1 10*3/uL — ABNORMAL HIGH (ref 0.2–1.0)
Monocytes Relative: 5 %
Myelocytes: 0 %
NEUTROS PCT: 37 %
NRBC: 0 /100{WBCs}
Neutro Abs: 7.9 10*3/uL — ABNORMAL HIGH (ref 1.4–6.5)
Other: 0 %
PLATELETS: 235 10*3/uL (ref 150–440)
PROMYELOCYTES RELATIVE: 0 %
RBC: 3.19 MIL/uL — AB (ref 4.40–5.90)
RDW: 15.8 % — AB (ref 11.5–14.5)
WBC: 21.3 10*3/uL — AB (ref 3.8–10.6)

## 2018-04-17 MED ORDER — SODIUM CHLORIDE 0.9% FLUSH
10.0000 mL | INTRAVENOUS | Status: DC | PRN
  Administered 2018-04-17: 10 mL via INTRAVENOUS
  Filled 2018-04-17: qty 10

## 2018-04-17 MED ORDER — HEPARIN SOD (PORK) LOCK FLUSH 100 UNIT/ML IV SOLN
500.0000 [IU] | Freq: Once | INTRAVENOUS | Status: AC
  Administered 2018-04-17: 500 [IU] via INTRAVENOUS

## 2018-04-20 ENCOUNTER — Inpatient Hospital Stay: Payer: Medicare Other

## 2018-04-26 NOTE — Progress Notes (Signed)
[place]  Telephone:(336) [phone] Fax:(336) [phone]  ID: Julian Medina OB: 08-01-1945  MR#: [id number]  CSN#:[id number]  Patient Care Team: Guadalupe Maple, MD as PCP - General (Family Medicine) Lloyd Huger, MD as Consulting Physician (Oncology) [name], MD as Consulting Physician (Urology)  CHIEF COMPLAINT: CLL  INTERVAL HISTORY: Patient returns to clinic today as an add-on after noting increasing lymphadenopathy in the bilateral axilla.  He continues to be anxious, but otherwise feels well.  He denies any recent fevers, night sweats, or weight loss. He has no neurologic complaints. He denies any pain. He has no chest pain or shortness of breath. He denies any nausea, vomiting, constipation, or diarrhea. He has no urinary complaints.  Patient otherwise feels well and offers no further specific complaints today.  REVIEW OF SYSTEMS:   Review of Systems  Constitutional: Negative.  Negative for diaphoresis, fever, malaise/fatigue and weight loss.  Respiratory: Negative.  Negative for cough and shortness of breath.   Cardiovascular: Negative.  Negative for chest pain and leg swelling.  Gastrointestinal: Negative.  Negative for abdominal pain and constipation.  Genitourinary: Negative.  Negative for dysuria.  Musculoskeletal: Negative.  Negative for back pain.  Skin: Negative.  Negative for rash.  Neurological: Negative.  Negative for sensory change, focal weakness and weakness.  Psychiatric/Behavioral: The patient is nervous/anxious.     As per HPI. Otherwise, a complete review of systems is negative.  PAST MEDICAL HISTORY: Past Medical History:  Diagnosis Date  . Anxiety   . CLL (chronic lymphocytic leukemia) ([place])   . COPD (chronic obstructive pulmonary disease) ([place])   . Hypertension     PAST SURGICAL HISTORY: Past Surgical History:  Procedure Laterality Date  . HERNIA REPAIR    . PERIPHERAL VASCULAR CATHETERIZATION N/A 03/14/2016   Procedure: Glori Luis Cath Insertion;  Surgeon: Algernon Huxley, MD;  Location: [place] [facility];  Service: Cardiovascular;  Laterality: N/A;  . ROBOTIC ASSITED PARTIAL NEPHRECTOMY Left 11/14/2015   Procedure: ROBOTIC ASSITED PARTIAL NEPHRECTOMY with intraoperative drop in ultrasound / Extensive Lysis of adhesions;  Surgeon: [name], MD;  Location: [facility];  Service: Urology;  Laterality: Left;    FAMILY HISTORY: Heart disease and hypertension.     ADVANCED DIRECTIVES:    HEALTH MAINTENANCE: Social History   Tobacco Use  . Smoking status: Current Every Day Smoker    Packs/day: 1.00    Years: 40.00    Pack years: 40.00    Types: Cigarettes  . Smokeless tobacco: Never Used  Substance Use Topics  . Alcohol use: Yes    Alcohol/week: 7.0 standard drinks    Types: 7 Cans of beer per week  . Drug use: No     Colonoscopy:  PAP:  Bone density:  Lipid panel:  Allergies  Allergen Reactions  . Aspirin Other (See Comments)    Ulcer   . Hydrochlorothiazide Itching    Current Outpatient Medications  Medication Sig Dispense Refill  . ADVAIR DISKUS 250-50 MCG/DOSE AEPB INHALE 1 PUFF 2 TIMES A DAY 180 each 3  . amLODipine (NORVASC) 10 MG tablet TAKE 1 TABLET BY MOUTH EVERY DAY 90 tablet 0  . benazepril (LOTENSIN) 40 MG tablet 1 TABLET ONCE EACH DAY ORAL 90 tablet 2  . lidocaine-prilocaine (EMLA) cream Apply 1 application topically as needed. 30 g 3  . LORazepam (ATIVAN) 1 MG tablet Take 1 tablet (1 mg total) by mouth daily as needed for anxiety. 30 tablet 0  . megestrol (MEGACE) 40 MG  tablet TAKE 1 TABLET BY MOUTH EVERY DAY 30 tablet 2  . pantoprazole (PROTONIX) 40 MG tablet TAKE 1 TABLET BY MOUTH EVERY DAY 90 tablet 1  . PROAIR HFA 108 (90 Base) MCG/ACT inhaler TAKE 2 PUFFS BY MOUTH EVERY 6 HOURS AS NEEDED FOR WHEEZE OR SHORTNESS OF BREATH 8.5 Inhaler 6  . chlorpheniramine-HYDROcodone (TUSSIONEX PENNKINETIC ER) 10-8 MG/5ML SUER Take 5 mLs by mouth every 12 (twelve) hours as needed.  (Patient not taking: Reported on 05/01/2018) 50 mL 0  . predniSONE (DELTASONE) 10 MG tablet Take 6 tabs day one, 5 tabs day two, 4 tabs day three, etc (Patient not taking: Reported on 05/01/2018) 21 tablet 0   Current Facility-Administered Medications  Medication Dose Route Frequency Provider Last Rate Last Dose  . albuterol (PROVENTIL) (2.5 MG/3ML) 0.083% nebulizer solution 2.5 mg  2.5 mg Nebulization Once Volney American, PA-C       Facility-Administered Medications Ordered in Other Visits  Medication Dose Route Frequency Provider Last Rate Last Dose  . sodium chloride flush (NS) 0.9 % injection 10 mL  10 mL Intravenous PRN Lloyd Huger, MD   10 mL at 01/26/18 1515    OBJECTIVE: Vitals:   05/01/18 1431  BP: 111/65  Pulse: 98  Resp: 18  Temp: 98.5 F (36.9 C)     Body mass index is 18.45 kg/m.    ECOG FS:0 - Asymptomatic  General: Well-developed, well-nourished, no acute distress. Eyes: Pink conjunctiva, anicteric sclera. HEENT: Normocephalic, moist mucous membranes, clear oropharnyx. Lungs: Clear to auscultation bilaterally. Heart: Regular rate and rhythm. No rubs, murmurs, or gallops. Abdomen: Soft, nontender, nondistended. No organomegaly noted, normoactive bowel sounds. Musculoskeletal: No edema, cyanosis, or clubbing. Neuro: Alert, answering all questions appropriately. Cranial nerves grossly intact. Skin: No rashes or petechiae noted. Psych: Normal affect. Lymphatics: Easily palpable bilateral axillary lymphadenopathy.  LAB RESULTS:  Lab Results  Component Value Date   NA 137 04/17/2018   K 4.3 04/17/2018   CL 111 04/17/2018   CO2 18 (L) 04/17/2018   GLUCOSE 85 04/17/2018   BUN 26 (H) 04/17/2018   CREATININE 2.08 (H) 04/17/2018   CALCIUM 9.0 04/17/2018   PROT 6.8 04/17/2018   ALBUMIN 4.1 04/17/2018   AST 17 04/17/2018   ALT 11 04/17/2018   ALKPHOS 53 04/17/2018   BILITOT 0.7 04/17/2018   GFRNONAA 30 (L) 04/17/2018   GFRAA 35 (L) 04/17/2018     Lab Results  Component Value Date   WBC 21.3 (H) 04/17/2018   NEUTROABS 7.9 (H) 04/17/2018   HGB 9.6 (L) 04/17/2018   HCT 27.8 (L) 04/17/2018   MCV 87.2 04/17/2018   PLT 235 04/17/2018     STUDIES: No results found.  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   PLAN:    1.  CLL: Flow cytometry confirmed the diagnosis. Patient is also ZAP-70 positive this which indicates it may be a more aggressive form of CLL. CT scan results from July 24, 2017 reviewed independently with essentially complete resolution of patient's known lymphadenopathy.  Patient completed 3 cycles of Rituxan and Treanda on April 25, 2017.  Given patient's new lymphadenopathy in his axilla as well as increasing white blood cell count to 21.3, he likely has recurrence of his CLL and will proceed with treatment using Rituxan and Treanda.  He also is noted to have progressive anemia.  We will get repeat imaging with CT scan prior to treatment.  Patient will return  to clinic on May 14, 2018 for further evaluation and initiation of cycle 1, day 1 of Rituxan and Treanda.   2.  Anxiety: Continue Xanax PRN. 3.  Renal cell carcinoma: CT results from July 24, 2017 noted with no evidence of recurrent or progressive disease.  Patient is status post partial nephrectomy on Nov 14, 2015.  Continue follow-up with urology as needed.   Repeat imaging as above. 4.  Anemia: Mild, monitor. 5.  Renal insufficiency: Patient's creatinine has trended up slightly and is 2.0 today.  Monitor.   Patient expressed understanding and was in agreement with this plan. He also understands that He can call clinic at any time with any questions, concerns, or complaints.    Lloyd Huger, MD   05/03/2018 9:07 AM

## 2018-05-01 ENCOUNTER — Encounter: Payer: Self-pay | Admitting: Oncology

## 2018-05-01 ENCOUNTER — Inpatient Hospital Stay (HOSPITAL_BASED_OUTPATIENT_CLINIC_OR_DEPARTMENT_OTHER): Payer: Medicare Other | Admitting: Oncology

## 2018-05-01 VITALS — BP 111/65 | HR 98 | Temp 98.5°F | Resp 18 | Wt 132.3 lb

## 2018-05-01 DIAGNOSIS — C642 Malignant neoplasm of left kidney, except renal pelvis: Secondary | ICD-10-CM

## 2018-05-01 DIAGNOSIS — C9190 Lymphoid leukemia, unspecified not having achieved remission: Secondary | ICD-10-CM | POA: Diagnosis not present

## 2018-05-01 DIAGNOSIS — J449 Chronic obstructive pulmonary disease, unspecified: Secondary | ICD-10-CM

## 2018-05-01 DIAGNOSIS — F419 Anxiety disorder, unspecified: Secondary | ICD-10-CM

## 2018-05-01 DIAGNOSIS — D649 Anemia, unspecified: Secondary | ICD-10-CM | POA: Diagnosis not present

## 2018-05-01 DIAGNOSIS — Z79899 Other long term (current) drug therapy: Secondary | ICD-10-CM

## 2018-05-01 DIAGNOSIS — Z85528 Personal history of other malignant neoplasm of kidney: Secondary | ICD-10-CM

## 2018-05-01 DIAGNOSIS — F17210 Nicotine dependence, cigarettes, uncomplicated: Secondary | ICD-10-CM

## 2018-05-01 DIAGNOSIS — C9110 Chronic lymphocytic leukemia of B-cell type not having achieved remission: Secondary | ICD-10-CM

## 2018-05-01 DIAGNOSIS — N2889 Other specified disorders of kidney and ureter: Secondary | ICD-10-CM

## 2018-05-01 DIAGNOSIS — Z5112 Encounter for antineoplastic immunotherapy: Secondary | ICD-10-CM

## 2018-05-01 DIAGNOSIS — I10 Essential (primary) hypertension: Secondary | ICD-10-CM | POA: Diagnosis not present

## 2018-05-01 DIAGNOSIS — Z905 Acquired absence of kidney: Secondary | ICD-10-CM

## 2018-05-01 NOTE — Progress Notes (Signed)
Pt reports enlarged lymph nodes under both arms.  Pt first noticed 6 weeks ago.

## 2018-05-07 ENCOUNTER — Other Ambulatory Visit: Payer: Self-pay | Admitting: *Deleted

## 2018-05-07 DIAGNOSIS — Z01812 Encounter for preprocedural laboratory examination: Secondary | ICD-10-CM

## 2018-05-08 ENCOUNTER — Inpatient Hospital Stay: Payer: Medicare Other

## 2018-05-08 ENCOUNTER — Ambulatory Visit
Admission: RE | Admit: 2018-05-08 | Discharge: 2018-05-08 | Disposition: A | Discharge status: Home / Self Care | Payer: Medicare Other | Source: Ambulatory Visit | Attending: Oncology | Admitting: Oncology

## 2018-05-08 DIAGNOSIS — N289 Disorder of kidney and ureter, unspecified: Secondary | ICD-10-CM | POA: Insufficient documentation

## 2018-05-08 DIAGNOSIS — I2510 Atherosclerotic heart disease of native coronary artery without angina pectoris: Secondary | ICD-10-CM | POA: Insufficient documentation

## 2018-05-08 DIAGNOSIS — J449 Chronic obstructive pulmonary disease, unspecified: Secondary | ICD-10-CM | POA: Diagnosis not present

## 2018-05-08 DIAGNOSIS — R590 Localized enlarged lymph nodes: Secondary | ICD-10-CM | POA: Diagnosis not present

## 2018-05-08 DIAGNOSIS — J439 Emphysema, unspecified: Secondary | ICD-10-CM | POA: Insufficient documentation

## 2018-05-08 DIAGNOSIS — F419 Anxiety disorder, unspecified: Secondary | ICD-10-CM | POA: Diagnosis not present

## 2018-05-08 DIAGNOSIS — I700 Atherosclerosis of aorta: Secondary | ICD-10-CM | POA: Insufficient documentation

## 2018-05-08 DIAGNOSIS — N2889 Other specified disorders of kidney and ureter: Secondary | ICD-10-CM | POA: Diagnosis not present

## 2018-05-08 DIAGNOSIS — C9110 Chronic lymphocytic leukemia of B-cell type not having achieved remission: Secondary | ICD-10-CM | POA: Diagnosis not present

## 2018-05-08 DIAGNOSIS — Z5112 Encounter for antineoplastic immunotherapy: Secondary | ICD-10-CM | POA: Diagnosis not present

## 2018-05-08 DIAGNOSIS — C642 Malignant neoplasm of left kidney, except renal pelvis: Secondary | ICD-10-CM | POA: Diagnosis not present

## 2018-05-08 DIAGNOSIS — R918 Other nonspecific abnormal finding of lung field: Secondary | ICD-10-CM | POA: Diagnosis not present

## 2018-05-08 DIAGNOSIS — D649 Anemia, unspecified: Secondary | ICD-10-CM | POA: Diagnosis not present

## 2018-05-08 LAB — CREATININE, SERUM
Creatinine, Ser: 2.14 mg/dL — ABNORMAL HIGH (ref 0.61–1.24)
GFR calc Af Amer: 34 mL/min — ABNORMAL LOW (ref >60)
GFR calc non Af Amer: 29 mL/min — ABNORMAL LOW (ref >60)

## 2018-05-08 MED ORDER — SODIUM CHLORIDE 0.9% FLUSH
10.0000 mL | INTRAVENOUS | Status: DC | PRN
  Administered 2018-05-08: 10 mL via INTRAVENOUS
  Filled 2018-05-08: qty 10

## 2018-05-08 MED ORDER — IOHEXOL 300 MG/ML  SOLN
100.0000 mL | Freq: Once | INTRAMUSCULAR | Status: AC | PRN
  Administered 2018-05-08: 100 mL via INTRAVENOUS

## 2018-05-08 MED ORDER — HEPARIN SOD (PORK) LOCK FLUSH 100 UNIT/ML IV SOLN
500.0000 [IU] | Freq: Once | INTRAVENOUS | Status: AC
  Administered 2018-05-08: 500 [IU] via INTRAVENOUS

## 2018-05-11 NOTE — Progress Notes (Signed)
[place]  Telephone:(336) [phone] Fax:(336) [phone]  ID: Julian Medina OB: [birth year]  MR#: [id number]  CSN#:[id number]  Patient Care Team: Guadalupe Maple, MD as PCP - General (Family Medicine) Lloyd Huger, MD as Consulting Physician (Oncology) [name], MD as Consulting Physician (Urology)  CHIEF COMPLAINT: CLL  INTERVAL HISTORY: Patient returns to clinic today for further evaluation and reinitiation of treatment with Treanda and Rituxan.  He continues to be anxious, but otherwise feels well. He denies any recent fevers, night sweats, or weight loss. He has no neurologic complaints. He denies any pain. He has no chest pain or shortness of breath. He denies any nausea, vomiting, constipation, or diarrhea. He has no urinary complaints.  Patient offers no further specific complaints today.  REVIEW OF SYSTEMS:   Review of Systems  Constitutional: Negative.  Negative for diaphoresis, fever, malaise/fatigue and weight loss.  Respiratory: Negative.  Negative for cough and shortness of breath.   Cardiovascular: Negative.  Negative for chest pain and leg swelling.  Gastrointestinal: Negative.  Negative for abdominal pain and constipation.  Genitourinary: Negative.  Negative for dysuria.  Musculoskeletal: Negative.  Negative for back pain.  Skin: Negative.  Negative for rash.  Neurological: Negative.  Negative for sensory change, focal weakness and weakness.  Psychiatric/Behavioral: The patient is nervous/anxious.     As per HPI. Otherwise, a complete review of systems is negative.  PAST MEDICAL HISTORY: Past Medical History:  Diagnosis Date  . Anxiety   . CLL (chronic lymphocytic leukemia) ([place])   . COPD (chronic obstructive pulmonary disease) ([place])   . Hypertension     PAST SURGICAL HISTORY: Past Surgical History:  Procedure Laterality Date  . HERNIA REPAIR    . PERIPHERAL VASCULAR CATHETERIZATION N/A 03/14/2016   Procedure: Glori Luis Cath  Insertion;  Surgeon: Algernon Huxley, MD;  Location: [place] [facility];  Service: Cardiovascular;  Laterality: N/A;  . ROBOTIC ASSITED PARTIAL NEPHRECTOMY Left 11/14/2015   Procedure: ROBOTIC ASSITED PARTIAL NEPHRECTOMY with intraoperative drop in ultrasound / Extensive Lysis of adhesions;  Surgeon: [name], MD;  Location: [facility];  Service: Urology;  Laterality: Left;    FAMILY HISTORY: Heart disease and hypertension.     ADVANCED DIRECTIVES:    HEALTH MAINTENANCE: Social History   Tobacco Use  . Smoking status: Current Every Day Smoker    Packs/day: 1.00    Years: 40.00    Pack years: 40.00    Types: Cigarettes  . Smokeless tobacco: Never Used  Substance Use Topics  . Alcohol use: Yes    Alcohol/week: 7.0 standard drinks    Types: 7 Cans of beer per week  . Drug use: No     Colonoscopy:  PAP:  Bone density:  Lipid panel:  Allergies  Allergen Reactions  . Aspirin Other (See Comments)    Ulcer   . Hydrochlorothiazide Itching    Current Outpatient Medications  Medication Sig Dispense Refill  . ADVAIR DISKUS 250-50 MCG/DOSE AEPB INHALE 1 PUFF 2 TIMES A DAY 180 each 3  . amLODipine (NORVASC) 10 MG tablet TAKE 1 TABLET BY MOUTH EVERY DAY 90 tablet 0  . benazepril (LOTENSIN) 40 MG tablet 1 TABLET ONCE EACH DAY ORAL 90 tablet 2  . lidocaine-prilocaine (EMLA) cream Apply 1 application topically as needed. 30 g 3  . LORazepam (ATIVAN) 1 MG tablet Take 1 tablet (1 mg total) by mouth daily as needed for anxiety. 30 tablet 0  . megestrol (MEGACE) 40 MG tablet TAKE 1 TABLET  BY MOUTH EVERY DAY 30 tablet 2  . pantoprazole (PROTONIX) 40 MG tablet TAKE 1 TABLET BY MOUTH EVERY DAY 90 tablet 1  . PROAIR HFA 108 (90 Base) MCG/ACT inhaler TAKE 2 PUFFS BY MOUTH EVERY 6 HOURS AS NEEDED FOR WHEEZE OR SHORTNESS OF BREATH 8.5 Inhaler 6  . chlorpheniramine-HYDROcodone (TUSSIONEX PENNKINETIC ER) 10-8 MG/5ML SUER Take 5 mLs by mouth every 12 (twelve) hours as needed. (Patient not taking:  Reported on 05/01/2018) 50 mL 0  . predniSONE (DELTASONE) 10 MG tablet Take 6 tabs day one, 5 tabs day two, 4 tabs day three, etc (Patient not taking: Reported on 05/14/2018) 21 tablet 0   Current Facility-Administered Medications  Medication Dose Route Frequency Provider Last Rate Last Dose  . albuterol (PROVENTIL) (2.5 MG/3ML) 0.083% nebulizer solution 2.5 mg  2.5 mg Nebulization Once Volney American, PA-C       Facility-Administered Medications Ordered in Other Visits  Medication Dose Route Frequency Provider Last Rate Last Dose  . sodium chloride flush (NS) 0.9 % injection 10 mL  10 mL Intravenous PRN Lloyd Huger, MD   10 mL at 01/26/18 1515    OBJECTIVE: Vitals:   05/14/18 0846  BP: 129/80  Pulse: 77  Resp: 20  Temp: 97.6 F (36.4 C)     Body mass index is 18.49 kg/m.    ECOG FS:0 - Asymptomatic  General: Well-developed, well-nourished, no acute distress. Eyes: Pink conjunctiva, anicteric sclera. HEENT: Normocephalic, moist mucous membranes, clear oropharnyx.  No palpable lymphadenopathy. Lungs: Clear to auscultation bilaterally. Heart: Regular rate and rhythm. No rubs, murmurs, or gallops. Abdomen: Soft, nontender, nondistended. No organomegaly noted, normoactive bowel sounds. Musculoskeletal: No edema, cyanosis, or clubbing. Neuro: Alert, answering all questions appropriately. Cranial nerves grossly intact. Skin: No rashes or petechiae noted. Psych: Normal affect. Lymphatics: Easily palpable bilateral axillary lymphadenopathy.  LAB RESULTS:  Lab Results  Component Value Date   NA 138 05/14/2018   K 4.3 05/14/2018   CL 114 (H) 05/14/2018   CO2 17 (L) 05/14/2018   GLUCOSE 101 (H) 05/14/2018   BUN 25 (H) 05/14/2018   CREATININE 2.16 (H) 05/14/2018   CALCIUM 9.3 05/14/2018   PROT 6.8 05/14/2018   ALBUMIN 4.1 05/14/2018   AST 16 05/14/2018   ALT 8 05/14/2018   ALKPHOS 58 05/14/2018   BILITOT 0.5 05/14/2018   GFRNONAA 29 (L) 05/14/2018   GFRAA 34  (L) 05/14/2018    Lab Results  Component Value Date   WBC 22.7 (H) 05/14/2018   NEUTROABS 8.9 (H) 05/14/2018   HGB 8.7 (L) 05/14/2018   HCT 26.5 (L) 05/14/2018   MCV 91.1 05/14/2018   PLT 287 05/14/2018     STUDIES: Ct Soft Tissue Neck W Contrast  Result Date: 05/08/2018 CLINICAL DATA:  Chronic lymphocytic leukemia. Worsening lymphadenopathy. EXAM: CT NECK WITH CONTRAST TECHNIQUE: Multidetector CT imaging of the neck was performed using the standard protocol following the bolus administration of intravenous contrast. CONTRAST:  171mL OMNIPAQUE IOHEXOL 300 MG/ML  SOLN COMPARISON:  02/17/2017 FINDINGS: Pharynx and larynx: Symmetric pharyngeal soft tissues without evidence of mass. Unremarkable larynx. Salivary glands: No inflammation, mass, or stone. Thyroid: Unchanged small calcification in the posterior left lobe. Lymph nodes: Bilateral cervical lymphadenopathy is again noted with some nodes demonstrating no more than 1 or 2 mm of size increase or decrease. Reference nodes measure 7 mm in short axis in the right supraclavicular region (series 2, image 77, previously 6 mm), 9 mm in left level IIB (series 2, image  52, previously 11 mm), and 9 mm in left level III (series 2, image 61, previously 10 mm). Vascular: Atherosclerosis including potentially moderate left ICA stenosis, grossly similar to prior. Limited intracranial: Unremarkable. Visualized orbits: Unremarkable. Mastoids and visualized paranasal sinuses: Clear. Skeleton: Advanced disc degeneration from C3-4 to C5-6 with resultant neural foraminal stenosis bilaterally. No suspicious osseous lesion. Upper chest: Axillary and mediastinal lymphadenopathy, reported separately. Other: None. IMPRESSION: Cervical lymphadenopathy without significant interval change. Electronically Signed   By: Logan Bores M.D.   On: 05/08/2018 13:36   Ct Chest W Contrast  Result Date: 05/08/2018 CLINICAL DATA:  Chronic lymphocytic leukemia. Left-sided renal cell  carcinoma. EXAM: CT CHEST, ABDOMEN, AND PELVIS WITH CONTRAST TECHNIQUE: Multidetector CT imaging of the chest, abdomen and pelvis was performed following the standard protocol during bolus administration of intravenous contrast. CONTRAST:  140mL OMNIPAQUE IOHEXOL 300 MG/ML  SOLN COMPARISON:  07/24/2017 FINDINGS: CT CHEST FINDINGS Cardiovascular: Aortic atherosclerosis. Normal heart size, without pericardial effusion. Multivessel coronary artery atherosclerosis. No central pulmonary embolism, on this non-dedicated study. Mediastinum/Nodes: Marked increase in axillary adenopathy with an index 2.4 cm right axillary node, increased from 1.0 cm on the prior. Left axillary index 2.1 cm node on image 14/3 has enlarged from 7 mm on the prior (when remeasured). Right paratracheal 1.2 cm node is at the site of a 4 mm node on the prior exam. New right hilar adenopathy at 2.3 cm on image 30/3. Surgical changes at the gastroesophageal junction. Lungs/Pleura: No pleural fluid. Mild to moderate centrilobular emphysema. Scattered upper lung predominant areas of ground-glass nodularity. Some of these are similar, including in the superior segment left lower lobe at 5 mm on image 49/6. Other areas are new or significant more conspicuous today. Example in the left upper lobe anteriorly at 6 mm on image 52/6 and in the central right upper lobe at 7 mm on image 56/6. No areas of consolidation or solid pulmonary nodules. Musculoskeletal: No acute osseous abnormality. CT ABDOMEN PELVIS FINDINGS Hepatobiliary: Normal liver. Normal gallbladder, without biliary ductal dilatation. Pancreas: Normal, without mass or ductal dilatation. Spleen: Normal in size, without focal abnormality. Adrenals/Urinary Tract: Mild bilateral adrenal thickening, similar. Bilateral too small to characterize renal lesions. A lower pole left renal lesion measures 1.1 cm on image 73/3, similar in size on the prior. Demonstrates complexity. A lower pole right-sided  lesion is similar in size at 1.0 cm on the prior. Well-circumscribed, but measures slightly greater than fluid density. No hydronephrosis. The bladder appears mildly thick walled. Stomach/Bowel: Normal stomach, without wall thickening. Normal colon, appendix, and terminal ileum. Small bowel entering a left inguinal hernia on image 117/3. Vascular/Lymphatic: Advanced aortic and branch vessel atherosclerosis. Redevelopment of retroperitoneal adenopathy within the in index left periaortic node of 2.4 cm on image 77/3. 7 mm on the prior. New porta hepatis adenopathy at 2.7 cm on image 59/3. Massive pelvic sidewall adenopathy with an index right external iliac node measuring 4.6 cm today versus 2.0 cm on the prior. Reproductive: Normal prostate. Other: No significant free fluid. Prior midline laparotomy. Right inguinal hernia repair. Musculoskeletal: No acute osseous abnormality. Convex right thoracolumbar spine curvature. IMPRESSION: 1. New and markedly progressive adenopathy in the chest, abdomen, and pelvis, consistent with progressive leukemia/lymphoma. 2. Aortic atherosclerosis (ICD10-I70.0), coronary artery atherosclerosis and emphysema (ICD10-J43.9). 3. Foci of ground-glass opacity within both lungs. Some are similar while others are new/progressive. Considerations remain chronic atypical infectious process or Langerhans cell histiocytosis. 4. Bilateral renal lesions which are primarily too small to characterize.  A lower pole left renal lesion is similar in size and could represent a solid neoplasm or complex cyst. Electronically Signed   By: Abigail Miyamoto M.D.   On: 05/08/2018 14:02   Ct Abdomen Pelvis W Contrast  Result Date: 05/08/2018 CLINICAL DATA:  Chronic lymphocytic leukemia. Left-sided renal cell carcinoma. EXAM: CT CHEST, ABDOMEN, AND PELVIS WITH CONTRAST TECHNIQUE: Multidetector CT imaging of the chest, abdomen and pelvis was performed following the standard protocol during bolus administration of  intravenous contrast. CONTRAST:  119mL OMNIPAQUE IOHEXOL 300 MG/ML  SOLN COMPARISON:  07/24/2017 FINDINGS: CT CHEST FINDINGS Cardiovascular: Aortic atherosclerosis. Normal heart size, without pericardial effusion. Multivessel coronary artery atherosclerosis. No central pulmonary embolism, on this non-dedicated study. Mediastinum/Nodes: Marked increase in axillary adenopathy with an index 2.4 cm right axillary node, increased from 1.0 cm on the prior. Left axillary index 2.1 cm node on image 14/3 has enlarged from 7 mm on the prior (when remeasured). Right paratracheal 1.2 cm node is at the site of a 4 mm node on the prior exam. New right hilar adenopathy at 2.3 cm on image 30/3. Surgical changes at the gastroesophageal junction. Lungs/Pleura: No pleural fluid. Mild to moderate centrilobular emphysema. Scattered upper lung predominant areas of ground-glass nodularity. Some of these are similar, including in the superior segment left lower lobe at 5 mm on image 49/6. Other areas are new or significant more conspicuous today. Example in the left upper lobe anteriorly at 6 mm on image 52/6 and in the central right upper lobe at 7 mm on image 56/6. No areas of consolidation or solid pulmonary nodules. Musculoskeletal: No acute osseous abnormality. CT ABDOMEN PELVIS FINDINGS Hepatobiliary: Normal liver. Normal gallbladder, without biliary ductal dilatation. Pancreas: Normal, without mass or ductal dilatation. Spleen: Normal in size, without focal abnormality. Adrenals/Urinary Tract: Mild bilateral adrenal thickening, similar. Bilateral too small to characterize renal lesions. A lower pole left renal lesion measures 1.1 cm on image 73/3, similar in size on the prior. Demonstrates complexity. A lower pole right-sided lesion is similar in size at 1.0 cm on the prior. Well-circumscribed, but measures slightly greater than fluid density. No hydronephrosis. The bladder appears mildly thick walled. Stomach/Bowel: Normal stomach,  without wall thickening. Normal colon, appendix, and terminal ileum. Small bowel entering a left inguinal hernia on image 117/3. Vascular/Lymphatic: Advanced aortic and branch vessel atherosclerosis. Redevelopment of retroperitoneal adenopathy within the in index left periaortic node of 2.4 cm on image 77/3. 7 mm on the prior. New porta hepatis adenopathy at 2.7 cm on image 59/3. Massive pelvic sidewall adenopathy with an index right external iliac node measuring 4.6 cm today versus 2.0 cm on the prior. Reproductive: Normal prostate. Other: No significant free fluid. Prior midline laparotomy. Right inguinal hernia repair. Musculoskeletal: No acute osseous abnormality. Convex right thoracolumbar spine curvature. IMPRESSION: 1. New and markedly progressive adenopathy in the chest, abdomen, and pelvis, consistent with progressive leukemia/lymphoma. 2. Aortic atherosclerosis (ICD10-I70.0), coronary artery atherosclerosis and emphysema (ICD10-J43.9). 3. Foci of ground-glass opacity within both lungs. Some are similar while others are new/progressive. Considerations remain chronic atypical infectious process or Langerhans cell histiocytosis. 4. Bilateral renal lesions which are primarily too small to characterize. A lower pole left renal lesion is similar in size and could represent a solid neoplasm or complex cyst. Electronically Signed   By: Abigail Miyamoto M.D.   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   PLAN:  1.  CLL: Flow cytometry confirmed the diagnosis. Patient is also ZAP-70 positive this which indicates it may be a more aggressive form of CLL.  CT scan results from May 08, 2018 reviewed independently and reported as above with progression of disease throughout chest, abdomen, and pelvis.  Previously, patient completed 3 cycles of Rituxan and Treanda on April 25, 2017.  Patient's white blood cell count has  trended up and he has a progressive anemia as well.  Proceed with cycle 1, day 1 of Rituxan and Treanda.  Return to clinic tomorrow for [place] only and then in 4 weeks for further evaluation and consideration of cycle 2, day 1.  Will likely do 3 cycles and then reimage.   2.  Anxiety: Continue Xanax PRN. 3.  Renal cell carcinoma: CT scan results from May 08, 2018 did not report any recurrent or progressive disease.  Patient is status post partial nephrectomy on Nov 14, 2015.  Continue follow-up with urology as needed.   4.  Anemia: Treanda and Rituxan as above. 5.  Renal insufficiency: Patient's creatinine is trended up and is now 2.16.  Monitor.  Consider nephrology referral in the future.  Patient expressed understanding and was in agreement with this plan. He also understands that He can call clinic at any time with any questions, concerns, or complaints.    Lloyd Huger, MD   05/15/2018 3:38 PM

## 2018-05-14 ENCOUNTER — Encounter: Payer: Self-pay | Admitting: Oncology

## 2018-05-14 ENCOUNTER — Inpatient Hospital Stay: Payer: Medicare Other

## 2018-05-14 ENCOUNTER — Inpatient Hospital Stay (HOSPITAL_BASED_OUTPATIENT_CLINIC_OR_DEPARTMENT_OTHER): Payer: Medicare Other | Admitting: Oncology

## 2018-05-14 VITALS — BP 129/80 | HR 77 | Temp 97.6°F | Resp 20 | Wt 132.6 lb

## 2018-05-14 DIAGNOSIS — J449 Chronic obstructive pulmonary disease, unspecified: Secondary | ICD-10-CM

## 2018-05-14 DIAGNOSIS — Z85528 Personal history of other malignant neoplasm of kidney: Secondary | ICD-10-CM

## 2018-05-14 DIAGNOSIS — D649 Anemia, unspecified: Secondary | ICD-10-CM | POA: Diagnosis not present

## 2018-05-14 DIAGNOSIS — I700 Atherosclerosis of aorta: Secondary | ICD-10-CM

## 2018-05-14 DIAGNOSIS — Z5112 Encounter for antineoplastic immunotherapy: Secondary | ICD-10-CM

## 2018-05-14 DIAGNOSIS — F17210 Nicotine dependence, cigarettes, uncomplicated: Secondary | ICD-10-CM | POA: Diagnosis not present

## 2018-05-14 DIAGNOSIS — N2889 Other specified disorders of kidney and ureter: Secondary | ICD-10-CM

## 2018-05-14 DIAGNOSIS — Z79899 Other long term (current) drug therapy: Secondary | ICD-10-CM | POA: Diagnosis not present

## 2018-05-14 DIAGNOSIS — Z905 Acquired absence of kidney: Secondary | ICD-10-CM | POA: Diagnosis not present

## 2018-05-14 DIAGNOSIS — Z01812 Encounter for preprocedural laboratory examination: Secondary | ICD-10-CM

## 2018-05-14 DIAGNOSIS — F419 Anxiety disorder, unspecified: Secondary | ICD-10-CM | POA: Diagnosis not present

## 2018-05-14 DIAGNOSIS — C9110 Chronic lymphocytic leukemia of B-cell type not having achieved remission: Secondary | ICD-10-CM | POA: Diagnosis not present

## 2018-05-14 DIAGNOSIS — I10 Essential (primary) hypertension: Secondary | ICD-10-CM

## 2018-05-14 DIAGNOSIS — K4090 Unilateral inguinal hernia, without obstruction or gangrene, not specified as recurrent: Secondary | ICD-10-CM

## 2018-05-14 DIAGNOSIS — C9190 Lymphoid leukemia, unspecified not having achieved remission: Secondary | ICD-10-CM | POA: Diagnosis not present

## 2018-05-14 LAB — CBC WITH DIFFERENTIAL/PLATELET
Basophils Absolute: 0 10*3/uL (ref 0.0–0.1)
Basophils Relative: 0 %
EOS PCT: 2 %
Eosinophils Absolute: 0.5 10*3/uL (ref 0.0–0.5)
HEMATOCRIT: 26.5 % — AB (ref 39.0–52.0)
Hemoglobin: 8.7 g/dL — ABNORMAL LOW (ref 13.0–17.0)
LYMPHS ABS: 12.9 10*3/uL — AB (ref 0.7–4.0)
Lymphocytes Relative: 57 %
MCH: 29.9 pg (ref 26.0–34.0)
MCHC: 32.8 g/dL (ref 30.0–36.0)
MCV: 91.1 fL (ref 80.0–100.0)
MONO ABS: 0.5 10*3/uL (ref 0.1–1.0)
Monocytes Relative: 2 %
NEUTROS ABS: 8.9 10*3/uL — AB (ref 1.7–7.7)
Neutrophils Relative %: 39 %
PLATELETS: 287 10*3/uL (ref 150–400)
RBC: 2.91 MIL/uL — ABNORMAL LOW (ref 4.22–5.81)
RDW: 15.9 % — ABNORMAL HIGH (ref 11.5–15.5)
Smear Review: NORMAL
WBC: 22.7 10*3/uL — ABNORMAL HIGH (ref 4.0–10.5)
nRBC: 0 % (ref 0.0–0.2)

## 2018-05-14 LAB — COMPREHENSIVE METABOLIC PANEL
ALBUMIN: 4.1 g/dL (ref 3.5–5.0)
ALT: 8 U/L (ref 0–44)
AST: 16 U/L (ref 15–41)
Alkaline Phosphatase: 58 U/L (ref 38–126)
Anion gap: 7 (ref 5–15)
BUN: 25 mg/dL — AB (ref 8–23)
CO2: 17 mmol/L — AB (ref 22–32)
Calcium: 9.3 mg/dL (ref 8.9–10.3)
Chloride: 114 mmol/L — ABNORMAL HIGH (ref 98–111)
Creatinine, Ser: 2.16 mg/dL — ABNORMAL HIGH (ref 0.61–1.24)
GFR calc Af Amer: 34 mL/min — ABNORMAL LOW (ref >60)
GFR calc non Af Amer: 29 mL/min — ABNORMAL LOW (ref >60)
GLUCOSE: 101 mg/dL — AB (ref 70–99)
POTASSIUM: 4.3 mmol/L (ref 3.5–5.1)
SODIUM: 138 mmol/L (ref 135–145)
Total Bilirubin: 0.5 mg/dL (ref 0.3–1.2)
Total Protein: 6.8 g/dL (ref 6.5–8.1)

## 2018-05-14 MED ORDER — SODIUM CHLORIDE 0.9 % IV SOLN: 10.0000 mg | Freq: Once | INTRAVENOUS | Status: DC

## 2018-05-14 MED ORDER — DEXAMETHASONE SODIUM PHOSPHATE 10 MG/ML IJ SOLN
10.0000 mg | Freq: Once | INTRAMUSCULAR | Status: AC
  Administered 2018-05-14: 10 mg via INTRAVENOUS
  Filled 2018-05-14: qty 1

## 2018-05-14 MED ORDER — PALONOSETRON HCL INJECTION 0.25 MG/5ML
0.2500 mg | Freq: Once | INTRAVENOUS | Status: AC
  Administered 2018-05-14: 0.25 mg via INTRAVENOUS

## 2018-05-14 MED ORDER — DIPHENHYDRAMINE HCL 25 MG PO CAPS
25.0000 mg | ORAL_CAPSULE | Freq: Once | ORAL | Status: AC
  Administered 2018-05-14: 25 mg via ORAL
  Filled 2018-05-14: qty 1

## 2018-05-14 MED ORDER — SODIUM CHLORIDE 0.9 % IV SOLN
Freq: Once | INTRAVENOUS | Status: AC
  Administered 2018-05-14: 09:00:00 via INTRAVENOUS
  Filled 2018-05-14: qty 250

## 2018-05-14 MED ORDER — SODIUM CHLORIDE 0.9 % IV SOLN
375.0000 mg/m2 | Freq: Once | INTRAVENOUS | Status: AC
  Administered 2018-05-14: 600 mg via INTRAVENOUS
  Filled 2018-05-14: qty 50

## 2018-05-14 MED ORDER — ACETAMINOPHEN 325 MG PO TABS
650.0000 mg | ORAL_TABLET | Freq: Once | ORAL | Status: AC
  Administered 2018-05-14: 650 mg via ORAL
  Filled 2018-05-14: qty 2

## 2018-05-14 MED ORDER — HEPARIN SOD (PORK) LOCK FLUSH 100 UNIT/ML IV SOLN
500.0000 [IU] | Freq: Once | INTRAVENOUS | Status: AC | PRN
  Administered 2018-05-14: 500 [IU]
  Filled 2018-05-14: qty 5

## 2018-05-14 MED ORDER — SODIUM CHLORIDE 0.9 % IV SOLN
90.0000 mg/m2 | Freq: Once | INTRAVENOUS | Status: AC
  Administered 2018-05-14: 150 mg via INTRAVENOUS
  Filled 2018-05-14: qty 6

## 2018-05-14 NOTE — Progress Notes (Signed)
Patient denies any concerns today.  

## 2018-05-15 ENCOUNTER — Inpatient Hospital Stay: Payer: Medicare Other | Attending: Oncology

## 2018-05-15 VITALS — BP 135/76 | HR 74 | Temp 97.2°F | Resp 20

## 2018-05-15 DIAGNOSIS — Z5111 Encounter for antineoplastic chemotherapy: Secondary | ICD-10-CM | POA: Diagnosis not present

## 2018-05-15 DIAGNOSIS — Z79899 Other long term (current) drug therapy: Secondary | ICD-10-CM | POA: Insufficient documentation

## 2018-05-15 DIAGNOSIS — C9110 Chronic lymphocytic leukemia of B-cell type not having achieved remission: Secondary | ICD-10-CM

## 2018-05-15 DIAGNOSIS — C9190 Lymphoid leukemia, unspecified not having achieved remission: Secondary | ICD-10-CM | POA: Insufficient documentation

## 2018-05-15 MED ORDER — SODIUM CHLORIDE 0.9% FLUSH
10.0000 mL | INTRAVENOUS | Status: DC | PRN
  Filled 2018-05-15: qty 10

## 2018-05-15 MED ORDER — SODIUM CHLORIDE 0.9 % IV SOLN
Freq: Once | INTRAVENOUS | Status: AC
  Administered 2018-05-15: 09:00:00 via INTRAVENOUS
  Filled 2018-05-15: qty 250

## 2018-05-15 MED ORDER — HEPARIN SOD (PORK) LOCK FLUSH 100 UNIT/ML IV SOLN
500.0000 [IU] | Freq: Once | INTRAVENOUS | Status: AC | PRN
  Administered 2018-05-15: 500 [IU]
  Filled 2018-05-15: qty 5

## 2018-05-15 MED ORDER — SODIUM CHLORIDE 0.9 % IV SOLN
90.0000 mg/m2 | Freq: Once | INTRAVENOUS | Status: AC
  Administered 2018-05-15: 150 mg via INTRAVENOUS
  Filled 2018-05-15: qty 6

## 2018-05-15 MED ORDER — DEXAMETHASONE SODIUM PHOSPHATE 10 MG/ML IJ SOLN
10.0000 mg | Freq: Once | INTRAMUSCULAR | Status: AC
  Administered 2018-05-15: 10 mg via INTRAVENOUS
  Filled 2018-05-15: qty 1

## 2018-05-19 ENCOUNTER — Other Ambulatory Visit: Payer: Self-pay | Admitting: *Deleted

## 2018-05-19 DIAGNOSIS — C9110 Chronic lymphocytic leukemia of B-cell type not having achieved remission: Secondary | ICD-10-CM

## 2018-06-04 ENCOUNTER — Ambulatory Visit (INDEPENDENT_AMBULATORY_CARE_PROVIDER_SITE_OTHER): Payer: Medicare Other | Admitting: Family Medicine

## 2018-06-04 ENCOUNTER — Encounter: Payer: Self-pay | Admitting: Family Medicine

## 2018-06-04 VITALS — BP 138/70 | HR 97 | Temp 97.5°F | Ht 69.13 in | Wt 132.5 lb

## 2018-06-04 DIAGNOSIS — Z0000 Encounter for general adult medical examination without abnormal findings: Secondary | ICD-10-CM | POA: Diagnosis not present

## 2018-06-04 DIAGNOSIS — D692 Other nonthrombocytopenic purpura: Secondary | ICD-10-CM

## 2018-06-04 DIAGNOSIS — K219 Gastro-esophageal reflux disease without esophagitis: Secondary | ICD-10-CM

## 2018-06-04 DIAGNOSIS — E46 Unspecified protein-calorie malnutrition: Secondary | ICD-10-CM | POA: Insufficient documentation

## 2018-06-04 DIAGNOSIS — Z7189 Other specified counseling: Secondary | ICD-10-CM

## 2018-06-04 DIAGNOSIS — J42 Unspecified chronic bronchitis: Secondary | ICD-10-CM

## 2018-06-04 DIAGNOSIS — N400 Enlarged prostate without lower urinary tract symptoms: Secondary | ICD-10-CM | POA: Diagnosis not present

## 2018-06-04 DIAGNOSIS — I10 Essential (primary) hypertension: Secondary | ICD-10-CM | POA: Diagnosis not present

## 2018-06-04 DIAGNOSIS — E440 Moderate protein-calorie malnutrition: Secondary | ICD-10-CM | POA: Diagnosis not present

## 2018-06-04 DIAGNOSIS — C9110 Chronic lymphocytic leukemia of B-cell type not having achieved remission: Secondary | ICD-10-CM

## 2018-06-04 DIAGNOSIS — F419 Anxiety disorder, unspecified: Secondary | ICD-10-CM | POA: Diagnosis not present

## 2018-06-04 MED ORDER — FLUTICASONE-SALMETEROL 250-50 MCG/DOSE IN AEPB: 1.0000 | INHALATION_SPRAY | Freq: Two times a day (BID) | RESPIRATORY_TRACT | 4 refills | Status: AC

## 2018-06-04 MED ORDER — PANTOPRAZOLE SODIUM 40 MG PO TBEC: 40.0000 mg | DELAYED_RELEASE_TABLET | Freq: Every day | ORAL | 4 refills | Status: AC

## 2018-06-04 MED ORDER — BENAZEPRIL HCL 40 MG PO TABS: 40.0000 mg | ORAL_TABLET | Freq: Every day | ORAL | 4 refills | Status: AC

## 2018-06-04 MED ORDER — AMLODIPINE BESYLATE 10 MG PO TABS: 10.0000 mg | ORAL_TABLET | Freq: Every day | ORAL | 4 refills | Status: AC

## 2018-06-04 NOTE — Assessment & Plan Note (Signed)
Stable

## 2018-06-04 NOTE — Assessment & Plan Note (Signed)
A voluntary discussion about advanced care planning including explanation and discussion of advanced directives was extentively discussed with the patient.  Explained about the healthcare proxy and living will was reviewed and packet with forms with expiration of how to fill them out was given.  Time spent: Encounter 16+ min individuals present: Patient 

## 2018-06-04 NOTE — Assessment & Plan Note (Signed)
Stable getting chemo

## 2018-06-04 NOTE — Assessment & Plan Note (Signed)
The current medical regimen is effective;  continue present plan and medications. a 

## 2018-06-04 NOTE — Progress Notes (Signed)
BP 138/70 (BP Location: Left Arm)   Pulse 97   Temp (!) 97.5 F (36.4 C)   Ht 5' 9.13" (1.756 m)   Wt 132 lb 8 oz (60.1 kg)   SpO2 100%   BMI 19.49 kg/m    Subjective:    Patient ID: Julian Medina, male    DOB: [birth year], 72 y.o.   MRN: [id number]  HPI: Julian Medina is a 72 y.o. male  Chief Complaint  Patient presents with  . Annual Exam  Patient all in all doing well. Followed by hematology for CLL getting treatment and doing okay. Patient's biggest problem is just not eating has had some lorazepam from Dr. Grayland Ormond which may be helped a little bit. Sebaceous cyst have been removed and has not come back to date. Reflux stable on Protonix Breathing doing okay using Advair Diskus.  Relevant past medical, surgical, family and social history reviewed and updated as indicated. Interim medical history since our last visit reviewed. Allergies and medications reviewed and updated.  Review of Systems  Constitutional: Negative.   HENT: Negative.   Eyes: Negative.   Respiratory: Negative.   Cardiovascular: Negative.   Gastrointestinal: Negative.   Endocrine: Negative.   Genitourinary: Negative.   Musculoskeletal: Negative.   Skin: Negative.   Allergic/Immunologic: Negative.   Neurological: Negative.   Hematological: Negative.   Psychiatric/Behavioral: Negative.     Per HPI unless specifically indicated above     Objective:    BP 138/70 (BP Location: Left Arm)   Pulse 97   Temp (!) 97.5 F (36.4 C)   Ht 5' 9.13" (1.756 m)   Wt 132 lb 8 oz (60.1 kg)   SpO2 100%   BMI 19.49 kg/m   Wt Readings from Last 3 Encounters:  06/04/18 132 lb 8 oz (60.1 kg)  05/14/18 132 lb 9.6 oz (60.1 kg)  05/01/18 132 lb 4.4 oz (60 kg)    Physical Exam  Constitutional: He is oriented to person, place, and time. He appears well-developed and well-nourished.  HENT:  Head: Normocephalic and atraumatic.  Right Ear: External ear normal.  Left Ear: External ear normal.  Eyes: Pupils  are equal, round, and reactive to light. Conjunctivae and EOM are normal.  Neck: Normal range of motion. Neck supple.  Cardiovascular: Normal rate, regular rhythm, normal heart sounds and intact distal pulses.  Pulmonary/Chest: Effort normal and breath sounds normal.  Abdominal: Soft. Bowel sounds are normal. There is no splenomegaly or hepatomegaly.  Genitourinary: Rectum normal and penis normal.  Genitourinary Comments: bph changes  Musculoskeletal: Normal range of motion.  Neurological: He is alert and oriented to person, place, and time. He has normal reflexes.  Skin: No rash noted. No erythema.  Psychiatric: He has a normal mood and affect. His behavior is normal. Judgment and thought content normal.    Results for orders placed or performed in visit on 05/14/18  Comprehensive metabolic panel  Result Value Ref Range   Sodium 138 135 - 145 mmol/L   Potassium 4.3 3.5 - 5.1 mmol/L   Chloride 114 (H) 98 - 111 mmol/L   CO2 17 (L) 22 - 32 mmol/L   Glucose, Bld 101 (H) 70 - 99 mg/dL   BUN 25 (H) 8 - 23 mg/dL   Creatinine, Ser 2.16 (H) 0.61 - 1.24 mg/dL   Calcium 9.3 8.9 - 10.3 mg/dL   Total Protein 6.8 6.5 - 8.1 g/dL   Albumin 4.1 3.5 - 5.0 g/dL   AST 16 15 -  41 U/L   ALT 8 0 - 44 U/L   Alkaline Phosphatase 58 38 - 126 U/L   Total Bilirubin 0.5 0.3 - 1.2 mg/dL   GFR calc non Af Amer 29 (L) >60 mL/min   GFR calc Af Amer 34 (L) >60 mL/min   Anion gap 7 5 - 15  CBC with Differential/Platelet  Result Value Ref Range   WBC 22.7 (H) 4.0 - 10.5 K/uL   RBC 2.91 (L) 4.22 - 5.81 MIL/uL   Hemoglobin 8.7 (L) 13.0 - 17.0 g/dL   HCT 26.5 (L) 39.0 - 52.0 %   MCV 91.1 80.0 - 100.0 fL   MCH 29.9 26.0 - 34.0 pg   MCHC 32.8 30.0 - 36.0 g/dL   RDW 15.9 (H) 11.5 - 15.5 %   Platelets 287 150 - 400 K/uL   nRBC 0.0 0.0 - 0.2 %   Neutrophils Relative % 39 %   Neutro Abs 8.9 (H) 1.7 - 7.7 K/uL   Lymphocytes Relative 57 %   Lymphs Abs 12.9 (H) 0.7 - 4.0 K/uL   Monocytes Relative 2 %   Monocytes  Absolute 0.5 0.1 - 1.0 K/uL   Eosinophils Relative 2 %   Eosinophils Absolute 0.5 0.0 - 0.5 K/uL   Basophils Relative 0 %   Basophils Absolute 0.0 0.0 - 0.1 K/uL   RBC Morphology MORPHOLOGY UNREMARKABLE    Smear Review Normal platelet morphology    Abnormal Lymphocytes Present PRESENT    Smudge Cells PRESENT       Assessment & Plan:   Problem List Items Addressed This Visit      Cardiovascular and Mediastinum   Essential hypertension - Primary    The current medical regimen is effective;  continue present plan and medications.       Relevant Medications   benazepril (LOTENSIN) 40 MG tablet   amLODipine (NORVASC) 10 MG tablet   Senile purpura (HCC)    stable      Relevant Medications   benazepril (LOTENSIN) 40 MG tablet   amLODipine (NORVASC) 10 MG tablet     Respiratory   COPD (chronic obstructive pulmonary disease) (HCC)    The current medical regimen is effective;  continue present plan and medications. a      Relevant Medications   Fluticasone-Salmeterol (ADVAIR DISKUS) 250-50 MCG/DOSE AEPB     Digestive   GERD (gastroesophageal reflux disease)    The current medical regimen is effective;  continue present plan and medications.       Relevant Medications   pantoprazole (PROTONIX) 40 MG tablet     Genitourinary   BPH (benign prostatic hyperplasia)    Stable         Other   Chronic lymphocytic leukemia (HCC)    Stable getting chemo      Anxiety    Stable rare use of lorezapam      Protein-calorie malnutrition ([place])   Advanced care planning/counseling discussion    A voluntary discussion about advanced care planning including explanation and discussion of advanced directives was extentively discussed with the patient.  Explained about the healthcare proxy and living will was reviewed and packet with forms with expiration of how to fill them out was given.  Time spent: Encounter 16+ min individuals present: Patient       Other Visit Diagnoses     Encounter for Medicare annual wellness exam           Follow up plan: Return in about 1 year (around 06/05/2019)  for Physical Exam.

## 2018-06-04 NOTE — Assessment & Plan Note (Signed)
Stable rare use of lorezapam

## 2018-06-04 NOTE — Assessment & Plan Note (Signed)
The current medical regimen is effective;  continue present plan and medications.  

## 2018-06-04 NOTE — Assessment & Plan Note (Signed)
stable °

## 2018-06-14 NOTE — Progress Notes (Signed)
[facility]  [phone] [phone]  ID: Julian Medina OB: 03-21-1946  MR#: [id number]  CSN#:[id number]  Patient Care Team: Guadalupe Maple, MD as PCP - General (Family Medicine) Lloyd Huger, MD as Consulting Physician (Oncology) [name], MD as Consulting Physician (Urology)  CHIEF COMPLAINT: CLL  INTERVAL HISTORY: Patient returns to clinic today for further evaluation and consideration of cycle 2 of Treanda and Rituxan.  He continues to be highly anxious, but otherwise feels well. He denies any recent fevers, night sweats, or weight loss. He has no neurologic complaints. He denies any pain. He has no chest pain or shortness of breath. He denies any nausea, vomiting, constipation, or diarrhea. He has no urinary complaints.  Patient feels at his baseline offers no further specific complaints today.  REVIEW OF SYSTEMS:   Review of Systems  Constitutional: Negative.  Negative for diaphoresis, fever, malaise/fatigue and weight loss.  Respiratory: Negative.  Negative for cough and shortness of breath.   Cardiovascular: Negative.  Negative for chest pain and leg swelling.  Gastrointestinal: Negative.  Negative for abdominal pain and constipation.  Genitourinary: Negative.  Negative for dysuria.  Musculoskeletal: Negative.  Negative for back pain.  Skin: Negative.  Negative for rash.  Neurological: Negative.  Negative for sensory change, focal weakness and weakness.  Psychiatric/Behavioral: The patient is nervous/anxious.     As per HPI. Otherwise, a complete review of systems is negative.  PAST MEDICAL HISTORY: Past Medical History:  Diagnosis Date  . Anxiety   . CLL (chronic lymphocytic leukemia) ([place])   . COPD (chronic obstructive pulmonary disease) ([place])   . Hypertension     PAST SURGICAL HISTORY: Past Surgical History:  Procedure Laterality Date  . HERNIA REPAIR    . PERIPHERAL VASCULAR CATHETERIZATION N/A 03/14/2016   Procedure: Glori Luis Cath Insertion;  Surgeon: Algernon Huxley, MD;  Location: [facility];  Service: Cardiovascular;  Laterality: N/A;  . ROBOTIC ASSITED PARTIAL NEPHRECTOMY Left 11/14/2015   Procedure: ROBOTIC ASSITED PARTIAL NEPHRECTOMY with intraoperative drop in ultrasound / Extensive Lysis of adhesions;  Surgeon: [name], MD;  Location: [facility];  Service: Urology;  Laterality: Left;    FAMILY HISTORY: Heart disease and hypertension.     ADVANCED DIRECTIVES:    HEALTH MAINTENANCE: Social History   Tobacco Use  . Smoking status: Current Every Day Smoker    Packs/day: 1.00    Years: 40.00    Pack years: 40.00    Types: Cigarettes  . Smokeless tobacco: Never Used  Substance Use Topics  . Alcohol use: Yes    Alcohol/week: 7.0 standard drinks    Types: 7 Cans of beer per week  . Drug use: No     Colonoscopy:  PAP:  Bone density:  Lipid panel:  Allergies  Allergen Reactions  . Aspirin Other (See Comments)    Ulcer   . Hydrochlorothiazide Itching    Current Outpatient Medications  Medication Sig Dispense Refill  . amLODipine (NORVASC) 10 MG tablet Take 1 tablet (10 mg total) by mouth daily. 90 tablet 4  . benazepril (LOTENSIN) 40 MG tablet Take 1 tablet (40 mg total) by mouth daily. 90 tablet 4  . Fluticasone-Salmeterol (ADVAIR DISKUS) 250-50 MCG/DOSE AEPB Inhale 1 puff into the lungs 2 (two) times daily. 180 each 4  . lidocaine-prilocaine (EMLA) cream Apply 1 application topically as needed. 30 g 3  . LORazepam (ATIVAN) 1 MG tablet Take 1 tablet (1 mg total) by mouth daily as needed for  anxiety. 30 tablet 0  . megestrol (MEGACE) 40 MG tablet TAKE 1 TABLET BY MOUTH EVERY DAY 30 tablet 2  . pantoprazole (PROTONIX) 40 MG tablet Take 1 tablet (40 mg total) by mouth daily. 90 tablet 4  . PROAIR HFA 108 (90 Base) MCG/ACT inhaler TAKE 2 PUFFS BY MOUTH EVERY 6 HOURS AS NEEDED FOR WHEEZE OR SHORTNESS OF BREATH 8.5 Inhaler 6   Current Facility-Administered Medications    Medication Dose Route Frequency Provider Last Rate Last Dose  . albuterol (PROVENTIL) (2.5 MG/3ML) 0.083% nebulizer solution 2.5 mg  2.5 mg Nebulization Once Volney American, PA-C       Facility-Administered Medications Ordered in Other Visits  Medication Dose Route Frequency Provider Last Rate Last Dose  . sodium chloride flush (NS) 0.9 % injection 10 mL  10 mL Intravenous PRN Lloyd Huger, MD   10 mL at 01/26/18 1515    OBJECTIVE: Vitals:   06/18/18 0857  BP: 131/77  Pulse: 79  Temp: (!) 97.5 F (36.4 C)     Body mass index is 18.94 kg/m.    ECOG FS:0 - Asymptomatic  General: Well-developed, well-nourished, no acute distress. Eyes: Pink conjunctiva, anicteric sclera. HEENT: Normocephalic, moist mucous membranes, clear oropharnyx.  No palpable lymphadenopathy. Lungs: Clear to auscultation bilaterally. Heart: Regular rate and rhythm. No rubs, murmurs, or gallops. Abdomen: Soft, nontender, nondistended. No organomegaly noted, normoactive bowel sounds. Musculoskeletal: No edema, cyanosis, or clubbing. Neuro: Alert, answering all questions appropriately. Cranial nerves grossly intact. Skin: No rashes or petechiae noted. Psych: Normal affect. Lymphatics: Axillary lymphadenopathy decreased in size.  LAB RESULTS:  Lab Results  Component Value Date   NA 138 06/18/2018   K 4.6 06/18/2018   CL 111 06/18/2018   CO2 17 (L) 06/18/2018   GLUCOSE 102 (H) 06/18/2018   BUN 27 (H) 06/18/2018   CREATININE 2.13 (H) 06/18/2018   CALCIUM 9.3 06/18/2018   PROT 7.2 06/18/2018   ALBUMIN 4.7 06/18/2018   AST 16 06/18/2018   ALT 10 06/18/2018   ALKPHOS 50 06/18/2018   BILITOT 0.7 06/18/2018   GFRNONAA 30 (L) 06/18/2018   GFRAA 35 (L) 06/18/2018    Lab Results  Component Value Date   WBC 12.6 (H) 06/18/2018   NEUTROABS 6.1 06/18/2018   HGB 9.8 (L) 06/18/2018   HCT 29.8 (L) 06/18/2018   MCV 93.1 06/18/2018   PLT 283 06/18/2018     STUDIES: No results  found.  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   PLAN:    1.  CLL: Flow cytometry confirmed the diagnosis. Patient is also ZAP-70 positive this which indicates it may be a more aggressive form of CLL.  CLL FISH panel was pending at time of dictation.  CT scan results from May 08, 2018 reviewed independently with progression of disease throughout chest, abdomen, and pelvis.  Previously, patient completed 3 cycles of Rituxan and Treanda on April 25, 2017.  Proceed with cycle 1, day 1 of Rituxan and Treanda.  Return to clinic tomorrow for [place] only.  Patient will then return to clinic in 4 weeks for further evaluation and consideration of cycle 3.  Plan to reimage at the conclusion of cycle 3.   2.  Anxiety: Continue Xanax PRN. 3.  Renal cell carcinoma: CT scan results from May 08, 2018 did not report any recurrent or progressive disease.  Patient is status post partial nephrectomy on Nov 14, 2015.  Continue follow-up with  urology as needed.   4.  Anemia: Hemoglobin improved to 9.8.  Monitor. 5.  Renal insufficiency: Patient's creatinine trended up, but is now stable at 2.13.  Monitor.  Consider nephrology referral in the future. 6.  Leukocytosis: Improving with treatment, monitor.  Patient expressed understanding and was in agreement with this plan. He also understands that He can call clinic at any time with any questions, concerns, or complaints.    Lloyd Huger, MD   06/20/2018 7:30 AM

## 2018-06-18 ENCOUNTER — Inpatient Hospital Stay: Payer: Medicare Other

## 2018-06-18 ENCOUNTER — Other Ambulatory Visit: Payer: Self-pay

## 2018-06-18 ENCOUNTER — Inpatient Hospital Stay (HOSPITAL_BASED_OUTPATIENT_CLINIC_OR_DEPARTMENT_OTHER): Payer: Medicare Other | Admitting: Oncology

## 2018-06-18 ENCOUNTER — Inpatient Hospital Stay: Payer: Medicare Other | Attending: Oncology

## 2018-06-18 VITALS — BP 131/77 | HR 79 | Temp 97.5°F | Ht 69.5 in | Wt 130.1 lb

## 2018-06-18 DIAGNOSIS — C9110 Chronic lymphocytic leukemia of B-cell type not having achieved remission: Secondary | ICD-10-CM | POA: Insufficient documentation

## 2018-06-18 DIAGNOSIS — Z5112 Encounter for antineoplastic immunotherapy: Secondary | ICD-10-CM | POA: Diagnosis not present

## 2018-06-18 DIAGNOSIS — I10 Essential (primary) hypertension: Secondary | ICD-10-CM | POA: Diagnosis not present

## 2018-06-18 DIAGNOSIS — F419 Anxiety disorder, unspecified: Secondary | ICD-10-CM | POA: Diagnosis not present

## 2018-06-18 DIAGNOSIS — F17210 Nicotine dependence, cigarettes, uncomplicated: Secondary | ICD-10-CM | POA: Insufficient documentation

## 2018-06-18 DIAGNOSIS — Z79899 Other long term (current) drug therapy: Secondary | ICD-10-CM | POA: Insufficient documentation

## 2018-06-18 DIAGNOSIS — Z5111 Encounter for antineoplastic chemotherapy: Secondary | ICD-10-CM | POA: Diagnosis not present

## 2018-06-18 DIAGNOSIS — N2889 Other specified disorders of kidney and ureter: Secondary | ICD-10-CM

## 2018-06-18 DIAGNOSIS — D509 Iron deficiency anemia, unspecified: Secondary | ICD-10-CM | POA: Diagnosis not present

## 2018-06-18 DIAGNOSIS — D72829 Elevated white blood cell count, unspecified: Secondary | ICD-10-CM

## 2018-06-18 DIAGNOSIS — J449 Chronic obstructive pulmonary disease, unspecified: Secondary | ICD-10-CM | POA: Insufficient documentation

## 2018-06-18 DIAGNOSIS — Z85528 Personal history of other malignant neoplasm of kidney: Secondary | ICD-10-CM | POA: Diagnosis not present

## 2018-06-18 LAB — CBC WITH DIFFERENTIAL/PLATELET
Abs Immature Granulocytes: 0.03 10*3/uL (ref 0.00–0.07)
BASOS PCT: 1 %
Basophils Absolute: 0.1 10*3/uL (ref 0.0–0.1)
EOS ABS: 0.9 10*3/uL — AB (ref 0.0–0.5)
Eosinophils Relative: 7 %
HEMATOCRIT: 29.8 % — AB (ref 39.0–52.0)
HEMOGLOBIN: 9.8 g/dL — AB (ref 13.0–17.0)
IMMATURE GRANULOCYTES: 0 %
Lymphocytes Relative: 38 %
Lymphs Abs: 4.7 10*3/uL — ABNORMAL HIGH (ref 0.7–4.0)
MCH: 30.6 pg (ref 26.0–34.0)
MCHC: 32.9 g/dL (ref 30.0–36.0)
MCV: 93.1 fL (ref 80.0–100.0)
Monocytes Absolute: 0.7 10*3/uL (ref 0.1–1.0)
Monocytes Relative: 6 %
NEUTROS PCT: 48 %
Neutro Abs: 6.1 10*3/uL (ref 1.7–7.7)
Platelets: 283 10*3/uL (ref 150–400)
RBC: 3.2 MIL/uL — AB (ref 4.22–5.81)
RDW: 14.8 % (ref 11.5–15.5)
WBC: 12.6 10*3/uL — AB (ref 4.0–10.5)
nRBC: 0 % (ref 0.0–0.2)

## 2018-06-18 LAB — COMPREHENSIVE METABOLIC PANEL
ALT: 10 U/L (ref 0–44)
ANION GAP: 10 (ref 5–15)
AST: 16 U/L (ref 15–41)
Albumin: 4.7 g/dL (ref 3.5–5.0)
Alkaline Phosphatase: 50 U/L (ref 38–126)
BILIRUBIN TOTAL: 0.7 mg/dL (ref 0.3–1.2)
BUN: 27 mg/dL — ABNORMAL HIGH (ref 8–23)
CALCIUM: 9.3 mg/dL (ref 8.9–10.3)
CO2: 17 mmol/L — ABNORMAL LOW (ref 22–32)
Chloride: 111 mmol/L (ref 98–111)
Creatinine, Ser: 2.13 mg/dL — ABNORMAL HIGH (ref 0.61–1.24)
GFR calc Af Amer: 35 mL/min — ABNORMAL LOW (ref >60)
GFR, EST NON AFRICAN AMERICAN: 30 mL/min — AB (ref >60)
Glucose, Bld: 102 mg/dL — ABNORMAL HIGH (ref 70–99)
POTASSIUM: 4.6 mmol/L (ref 3.5–5.1)
Sodium: 138 mmol/L (ref 135–145)
Total Protein: 7.2 g/dL (ref 6.5–8.1)

## 2018-06-18 MED ORDER — SODIUM CHLORIDE 0.9 % IV SOLN
90.0000 mg/m2 | Freq: Once | INTRAVENOUS | Status: AC
  Administered 2018-06-18: 150 mg via INTRAVENOUS
  Filled 2018-06-18: qty 6

## 2018-06-18 MED ORDER — SODIUM CHLORIDE 0.9 % IV SOLN
375.0000 mg/m2 | Freq: Once | INTRAVENOUS | Status: AC
  Administered 2018-06-18: 600 mg via INTRAVENOUS
  Filled 2018-06-18: qty 50

## 2018-06-18 MED ORDER — DEXAMETHASONE SODIUM PHOSPHATE 10 MG/ML IJ SOLN
10.0000 mg | Freq: Once | INTRAMUSCULAR | Status: AC
  Administered 2018-06-18: 10 mg via INTRAVENOUS
  Filled 2018-06-18: qty 1

## 2018-06-18 MED ORDER — PALONOSETRON HCL INJECTION 0.25 MG/5ML
0.2500 mg | Freq: Once | INTRAVENOUS | Status: AC
  Administered 2018-06-18: 0.25 mg via INTRAVENOUS
  Filled 2018-06-18: qty 5

## 2018-06-18 MED ORDER — DIPHENHYDRAMINE HCL 25 MG PO CAPS
25.0000 mg | ORAL_CAPSULE | Freq: Once | ORAL | Status: AC
  Administered 2018-06-18: 25 mg via ORAL
  Filled 2018-06-18: qty 1

## 2018-06-18 MED ORDER — ACETAMINOPHEN 325 MG PO TABS
650.0000 mg | ORAL_TABLET | Freq: Once | ORAL | Status: AC
  Administered 2018-06-18: 650 mg via ORAL
  Filled 2018-06-18: qty 2

## 2018-06-18 MED ORDER — SODIUM CHLORIDE 0.9 % IV SOLN: 375.0000 mg/m2 | Freq: Once | INTRAVENOUS | Status: DC

## 2018-06-18 MED ORDER — SODIUM CHLORIDE 0.9% FLUSH
10.0000 mL | INTRAVENOUS | Status: DC | PRN
  Administered 2018-06-18: 10 mL
  Filled 2018-06-18: qty 10

## 2018-06-18 MED ORDER — PALONOSETRON HCL INJECTION 0.25 MG/5ML: 0.2500 mg | Freq: Once | INTRAVENOUS | Status: DC

## 2018-06-18 MED ORDER — SODIUM CHLORIDE 0.9 % IV SOLN
Freq: Once | INTRAVENOUS | Status: AC
  Administered 2018-06-18: 10:00:00 via INTRAVENOUS
  Filled 2018-06-18: qty 250

## 2018-06-18 MED ORDER — HEPARIN SOD (PORK) LOCK FLUSH 100 UNIT/ML IV SOLN
500.0000 [IU] | Freq: Once | INTRAVENOUS | Status: AC | PRN
  Administered 2018-06-18: 500 [IU]
  Filled 2018-06-18: qty 5

## 2018-06-18 NOTE — Progress Notes (Signed)
Patient is here to follow up on his chronic lymphocytic leukemia. Patient stated that he had been doing well. Patient denied nausea, vomiting, fever, chills, constipation or diarrhea.

## 2018-06-19 ENCOUNTER — Inpatient Hospital Stay: Payer: Medicare Other

## 2018-06-19 VITALS — BP 130/72 | HR 77 | Temp 95.0°F | Resp 18

## 2018-06-19 DIAGNOSIS — N2889 Other specified disorders of kidney and ureter: Secondary | ICD-10-CM | POA: Diagnosis not present

## 2018-06-19 DIAGNOSIS — C9110 Chronic lymphocytic leukemia of B-cell type not having achieved remission: Secondary | ICD-10-CM | POA: Diagnosis not present

## 2018-06-19 DIAGNOSIS — D509 Iron deficiency anemia, unspecified: Secondary | ICD-10-CM | POA: Diagnosis not present

## 2018-06-19 DIAGNOSIS — Z5112 Encounter for antineoplastic immunotherapy: Secondary | ICD-10-CM | POA: Diagnosis not present

## 2018-06-19 DIAGNOSIS — D72829 Elevated white blood cell count, unspecified: Secondary | ICD-10-CM | POA: Diagnosis not present

## 2018-06-19 DIAGNOSIS — Z5111 Encounter for antineoplastic chemotherapy: Secondary | ICD-10-CM | POA: Diagnosis not present

## 2018-06-19 MED ORDER — HEPARIN SOD (PORK) LOCK FLUSH 100 UNIT/ML IV SOLN
500.0000 [IU] | Freq: Once | INTRAVENOUS | Status: AC | PRN
  Administered 2018-06-19: 500 [IU]

## 2018-06-19 MED ORDER — DEXAMETHASONE SODIUM PHOSPHATE 10 MG/ML IJ SOLN
10.0000 mg | Freq: Once | INTRAMUSCULAR | Status: AC
  Administered 2018-06-19: 10 mg via INTRAVENOUS
  Filled 2018-06-19: qty 1

## 2018-06-19 MED ORDER — SODIUM CHLORIDE 0.9 % IV SOLN
Freq: Once | INTRAVENOUS | Status: AC
  Administered 2018-06-19: 09:00:00 via INTRAVENOUS
  Filled 2018-06-19: qty 250

## 2018-06-19 MED ORDER — SODIUM CHLORIDE 0.9 % IV SOLN
90.0000 mg/m2 | Freq: Once | INTRAVENOUS | Status: AC
  Administered 2018-06-19: 150 mg via INTRAVENOUS
  Filled 2018-06-19: qty 6

## 2018-06-26 LAB — MISC LABCORP TEST (SEND OUT): LABCORP TEST CODE: 113753

## 2018-07-03 LAB — MISC LABCORP TEST (SEND OUT): Labcorp test code: 510340

## 2018-07-07 ENCOUNTER — Ambulatory Visit (INDEPENDENT_AMBULATORY_CARE_PROVIDER_SITE_OTHER): Payer: Medicare Other | Admitting: Family Medicine

## 2018-07-07 ENCOUNTER — Encounter: Payer: Self-pay | Admitting: Family Medicine

## 2018-07-07 VITALS — BP 137/78 | HR 98 | Temp 97.8°F | Wt 131.0 lb

## 2018-07-07 DIAGNOSIS — J441 Chronic obstructive pulmonary disease with (acute) exacerbation: Secondary | ICD-10-CM | POA: Diagnosis not present

## 2018-07-07 MED ORDER — ALBUTEROL SULFATE (2.5 MG/3ML) 0.083% IN NEBU
2.5000 mg | INHALATION_SOLUTION | Freq: Once | RESPIRATORY_TRACT | Status: AC
  Administered 2018-07-07: 2.5 mg via RESPIRATORY_TRACT

## 2018-07-07 MED ORDER — LEVOCETIRIZINE DIHYDROCHLORIDE 5 MG PO TABS: 5.0000 mg | ORAL_TABLET | Freq: Every evening | ORAL | 5 refills | Status: AC

## 2018-07-07 MED ORDER — IPRATROPIUM BROMIDE 0.03 % NA SOLN: 2.0000 | Freq: Two times a day (BID) | NASAL | 12 refills | Status: AC

## 2018-07-07 NOTE — Assessment & Plan Note (Addendum)
No evidence of bacterial infection Nebulizer tx given in clinic with some improvement. Anoro sample given, and start xyzal to help with allergic sxs. Call if not improving

## 2018-07-07 NOTE — Progress Notes (Signed)
BP 137/78   Pulse 98   Temp 97.8 F (36.6 C) (Oral)   Wt 131 lb (59.4 kg)   SpO2 100%   BMI 19.07 kg/m    Subjective:    Patient ID: Julian Medina, male    DOB: 10-14-1945, 72 y.o.   MRN: [id number]  HPI: Julian Medina is a 72 y.o. male  Chief Complaint  Patient presents with  . Nasal Congestion    x 2 weeks.   . Fatigue   3-4 weeks of fatigue, nasal congestion, burning in sinuses, shortness of breath with exertion, dry cough. Denies fever, chills, body aches, CP. No hx of allergies. COPD on advair and albuterol prn. Compliant with inhaler regimen. Several sick contacts.   Relevant past medical, surgical, family and social history reviewed and updated as indicated. Interim medical history since our last visit reviewed. Allergies and medications reviewed and updated.  Review of Systems  Per HPI unless specifically indicated above     Objective:    BP 137/78   Pulse 98   Temp 97.8 F (36.6 C) (Oral)   Wt 131 lb (59.4 kg)   SpO2 100%   BMI 19.07 kg/m   Wt Readings from Last 3 Encounters:  07/07/18 131 lb (59.4 kg)  06/18/18 130 lb 1.6 oz (59 kg)  06/04/18 132 lb 8 oz (60.1 kg)    Physical Exam Vitals signs and nursing note reviewed.  Constitutional:      Appearance: He is well-developed.  HENT:     Head: Atraumatic.     Right Ear: External ear normal.     Left Ear: External ear normal.     Mouth/Throat:     Pharynx: Posterior oropharyngeal erythema present. No oropharyngeal exudate.  Eyes:     Conjunctiva/sclera: Conjunctivae normal.     Pupils: Pupils are equal, round, and reactive to light.  Neck:     Musculoskeletal: Normal range of motion and neck supple.  Cardiovascular:     Rate and Rhythm: Normal rate and regular rhythm.  Pulmonary:     Effort: Pulmonary effort is normal. No respiratory distress.     Breath sounds: Wheezing present. No rales.  Abdominal:     General: Bowel sounds are normal. There is no distension.     Palpations: Abdomen is  soft.     Tenderness: There is no abdominal tenderness.  Musculoskeletal: Normal range of motion.  Lymphadenopathy:     Cervical: No cervical adenopathy.  Skin:    General: Skin is warm and dry.     Findings: No rash.  Neurological:     Mental Status: He is alert and oriented to person, place, and time.  Psychiatric:        Behavior: Behavior normal.     Results for orders placed or performed in visit on 06/18/18  Comprehensive metabolic panel  Result Value Ref Range   Sodium 138 135 - 145 mmol/L   Potassium 4.6 3.5 - 5.1 mmol/L   Chloride 111 98 - 111 mmol/L   CO2 17 (L) 22 - 32 mmol/L   Glucose, Bld 102 (H) 70 - 99 mg/dL   BUN 27 (H) 8 - 23 mg/dL   Creatinine, Ser 2.13 (H) 0.61 - 1.24 mg/dL   Calcium 9.3 8.9 - 10.3 mg/dL   Total Protein 7.2 6.5 - 8.1 g/dL   Albumin 4.7 3.5 - 5.0 g/dL   AST 16 15 - 41 U/L   ALT 10 0 - 44 U/L  Alkaline Phosphatase 50 38 - 126 U/L   Total Bilirubin 0.7 0.3 - 1.2 mg/dL   GFR calc non Af Amer 30 (L) >60 mL/min   GFR calc Af Amer 35 (L) >60 mL/min   Anion gap 10 5 - 15  CBC with Differential/Platelet  Result Value Ref Range   WBC 12.6 (H) 4.0 - 10.5 K/uL   RBC 3.20 (L) 4.22 - 5.81 MIL/uL   Hemoglobin 9.8 (L) 13.0 - 17.0 g/dL   HCT 29.8 (L) 39.0 - 52.0 %   MCV 93.1 80.0 - 100.0 fL   MCH 30.6 26.0 - 34.0 pg   MCHC 32.9 30.0 - 36.0 g/dL   RDW 14.8 11.5 - 15.5 %   Platelets 283 150 - 400 K/uL   nRBC 0.0 0.0 - 0.2 %   Neutrophils Relative % 48 %   Neutro Abs 6.1 1.7 - 7.7 K/uL   Lymphocytes Relative 38 %   Lymphs Abs 4.7 (H) 0.7 - 4.0 K/uL   Monocytes Relative 6 %   Monocytes Absolute 0.7 0.1 - 1.0 K/uL   Eosinophils Relative 7 %   Eosinophils Absolute 0.9 (H) 0.0 - 0.5 K/uL   Basophils Relative 1 %   Basophils Absolute 0.1 0.0 - 0.1 K/uL   Smear Review SMEAR SCANNED    Immature Granulocytes 0 %   Abs Immature Granulocytes 0.03 0.00 - 0.07 K/uL  Miscellaneous [facility] test (send-out)  Result Value Ref Range   [facility] test code  [id number]    [facility] test name CLL FISH PROFILE    Misc [facility] result COMMENT   Miscellaneous [facility] test (send-out)  Result Value Ref Range   [facility] test code [id number]    [facility] test name CLL IGVH MUTATION    Misc [facility] result COMMENT       Assessment & Plan:   Problem List Items Addressed This Visit      Respiratory   COPD (chronic obstructive pulmonary disease) ([place]) - Primary    No evidence of bacterial infection Nebulizer tx given in clinic with some improvement. Anoro sample given, and start xyzal to help with allergic sxs. Call if not improving      Relevant Medications   albuterol (PROVENTIL) (2.5 MG/3ML) 0.083% nebulizer solution 2.5 mg (Completed)   ipratropium (ATROVENT) 0.03 % nasal spray   levocetirizine (XYZAL) 5 MG tablet       Follow up plan: Return if symptoms worsen or fail to improve.

## 2018-07-10 ENCOUNTER — Other Ambulatory Visit: Payer: Self-pay | Admitting: Oncology

## 2018-07-10 NOTE — Progress Notes (Signed)
[place]  [phone] [phone]  ID: Julian Medina OB: [birth year]  MR#: [id number]  YKZ#:[id number]  Patient Care Team: Guadalupe Maple, MD as PCP - General (Family Medicine) Lloyd Huger, MD as Consulting Physician (Oncology) [name], MD as Consulting Physician (Urology)  CHIEF COMPLAINT: CLL, 13q-  INTERVAL HISTORY: Patient returns to clinic today for further evaluation and consideration of cycle 3 of Treanda and Rituxan.  He has noticed increasing weakness and fatigue over the past several weeks.  He continues to be highly anxious.  He denies any fevers or night sweats.  He has had some mild weight loss recently.  He has no neurologic complaints. He denies any pain. He has no chest pain or shortness of breath. He denies any nausea, vomiting, constipation, or diarrhea. He has no urinary complaints.  Patient offers no further specific complaints today.  REVIEW OF SYSTEMS:   Review of Systems  Constitutional: Negative.  Negative for diaphoresis, fever, malaise/fatigue and weight loss.  Respiratory: Negative.  Negative for cough and shortness of breath.   Cardiovascular: Negative.  Negative for chest pain and leg swelling.  Gastrointestinal: Negative.  Negative for abdominal pain and constipation.  Genitourinary: Negative.  Negative for dysuria.  Musculoskeletal: Negative.  Negative for back pain.  Skin: Negative.  Negative for rash.  Neurological: Negative.  Negative for sensory change, focal weakness and weakness.  Psychiatric/Behavioral: The patient is nervous/anxious.     As per HPI. Otherwise, a complete review of systems is negative.  PAST MEDICAL HISTORY: Past Medical History:  Diagnosis Date  . Anxiety   . CLL (chronic lymphocytic leukemia) ([place])   . COPD (chronic obstructive pulmonary disease) ([place])   . Hypertension     PAST SURGICAL HISTORY: Past Surgical History:  Procedure Laterality Date  . HERNIA REPAIR     . PERIPHERAL VASCULAR CATHETERIZATION N/A 03/14/2016   Procedure: Glori Luis Cath Insertion;  Surgeon: Algernon Huxley, MD;  Location: [facility];  Service: Cardiovascular;  Laterality: N/A;  . ROBOTIC ASSITED PARTIAL NEPHRECTOMY Left 11/14/2015   Procedure: ROBOTIC ASSITED PARTIAL NEPHRECTOMY with intraoperative drop in ultrasound / Extensive Lysis of adhesions;  Surgeon: [name], MD;  Location: [facility];  Service: Urology;  Laterality: Left;    FAMILY HISTORY: Heart disease and hypertension.     ADVANCED DIRECTIVES:    HEALTH MAINTENANCE: Social History   Tobacco Use  . Smoking status: Current Every Day Smoker    Packs/day: 1.00    Years: 40.00    Pack years: 40.00    Types: Cigarettes  . Smokeless tobacco: Never Used  Substance Use Topics  . Alcohol use: Yes    Alcohol/week: 7.0 standard drinks    Types: 7 Cans of beer per week  . Drug use: No     Colonoscopy:  PAP:  Bone density:  Lipid panel:  Allergies  Allergen Reactions  . Aspirin Other (See Comments)    Ulcer   . Hydrochlorothiazide Itching    Current Outpatient Medications  Medication Sig Dispense Refill  . amLODipine (NORVASC) 10 MG tablet Take 1 tablet (10 mg total) by mouth daily. 90 tablet 4  . benazepril (LOTENSIN) 40 MG tablet Take 1 tablet (40 mg total) by mouth daily. 90 tablet 4  . Fluticasone-Salmeterol (ADVAIR DISKUS) 250-50 MCG/DOSE AEPB Inhale 1 puff into the lungs 2 (two) times daily. 180 each 4  . ipratropium (ATROVENT) 0.03 % nasal spray Place 2 sprays into both nostrils every 12 (twelve) hours.  30 mL 12  . levocetirizine (XYZAL) 5 MG tablet Take 1 tablet (5 mg total) by mouth every evening. 30 tablet 5  . lidocaine-prilocaine (EMLA) cream Apply 1 application topically as needed. 30 g 3  . LORazepam (ATIVAN) 1 MG tablet Take 1 tablet (1 mg total) by mouth daily as needed for anxiety. 30 tablet 0  . megestrol (MEGACE) 40 MG tablet TAKE 1 TABLET BY MOUTH EVERY DAY 30 tablet 2  .  pantoprazole (PROTONIX) 40 MG tablet Take 1 tablet (40 mg total) by mouth daily. 90 tablet 4  . PROAIR HFA 108 (90 Base) MCG/ACT inhaler TAKE 2 PUFFS BY MOUTH EVERY 6 HOURS AS NEEDED FOR WHEEZE OR SHORTNESS OF BREATH 8.5 Inhaler 6  . umeclidinium-vilanterol (ANORO ELLIPTA) 62.5-25 MCG/INH AEPB Inhale 1 puff into the lungs daily. 60 each 3   Current Facility-Administered Medications  Medication Dose Route Frequency Provider Last Rate Last Dose  . albuterol (PROVENTIL) (2.5 MG/3ML) 0.083% nebulizer solution 2.5 mg  2.5 mg Nebulization Once Volney American, PA-C       Facility-Administered Medications Ordered in Other Visits  Medication Dose Route Frequency Provider Last Rate Last Dose  . sodium chloride flush (NS) 0.9 % injection 10 mL  10 mL Intravenous PRN Lloyd Huger, MD   10 mL at 01/26/18 1515    OBJECTIVE: Vitals:   07/16/18 0840  BP: 134/77  Temp: 97.6 F (36.4 C)     Body mass index is 18.89 kg/m.    ECOG FS:0 - Asymptomatic  General: Well-developed, well-nourished, no acute distress. Eyes: Pink conjunctiva, anicteric sclera. HEENT: Normocephalic, moist mucous membranes, clear oropharnyx.  No palpable lymphadenopathy. Lungs: Clear to auscultation bilaterally. Heart: Regular rate and rhythm. No rubs, murmurs, or gallops. Abdomen: Soft, nontender, nondistended. No organomegaly noted, normoactive bowel sounds. Musculoskeletal: No edema, cyanosis, or clubbing. Neuro: Alert, answering all questions appropriately. Cranial nerves grossly intact. Skin: No rashes or petechiae noted. Psych: Normal affect. Lymphatics: No cervical, calvicular, axillary or inguinal LAD.  LAB RESULTS:  Lab Results  Component Value Date   NA 137 07/16/2018   K 4.6 07/16/2018   CL 112 (H) 07/16/2018   CO2 16 (L) 07/16/2018   GLUCOSE 100 (H) 07/16/2018   BUN 21 07/16/2018   CREATININE 1.95 (H) 07/16/2018   CALCIUM 9.4 07/16/2018   PROT 6.9 07/16/2018   ALBUMIN 4.3 07/16/2018   AST  17 07/16/2018   ALT 9 07/16/2018   ALKPHOS 62 07/16/2018   BILITOT 0.6 07/16/2018   GFRNONAA 34 (L) 07/16/2018   GFRAA 39 (L) 07/16/2018    Lab Results  Component Value Date   WBC 11.3 (H) 07/16/2018   NEUTROABS 6.4 07/16/2018   HGB 7.6 (L) 07/16/2018   HCT 23.3 (L) 07/16/2018   MCV 110.4 (H) 07/16/2018   PLT 249 07/16/2018     STUDIES: No results found.  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   PLAN:    1.  CLL: Flow cytometry confirmed the diagnosis. Patient is also ZAP-70 positive this which indicates it may be a more aggressive form of CLL although FISH panel revealed 13q- which tends to have the best prognosis. CT scan results from May 08, 2018 reviewed independently with progression of disease throughout chest, abdomen, and pelvis.  Previously, patient completed 3 cycles of Rituxan and Treanda on April 25, 2017.  Proceed with cycle 2, day 1 of Rituxan and Treanda today.  Return to clinic tomorrow  for consideration of cycle 3, day 2.  Patient will then return to clinic in 4 weeks for further evaluation and consideration of cycle 4.  Will reimage with CT scan prior to cycle 4.  2.  Anxiety: Continue Xanax PRN. 3.  Renal cell carcinoma: CT scan results from May 08, 2018 did not report any recurrent or progressive disease.  Patient is status post partial nephrectomy on Nov 14, 2015.  Continue follow-up with urology as needed.   4.  Anemia: Patient's hemoglobin has trended down to 7.6 and he is symptomatic.  Return to clinic in 1 week for 1 unit packed red blood cells. 5.  Renal insufficiency: Patient's creatinine appears to be approximately his baseline at 1.95.  Consider nephrology referral in the future. 6.  Leukocytosis: Improving with treatment, monitor.  Patient expressed understanding and was in agreement with this plan. He also understands that He can call clinic at any time with any questions,  concerns, or complaints.    Lloyd Huger, MD   07/17/2018 6:50 AM

## 2018-07-14 ENCOUNTER — Telehealth: Payer: Self-pay | Admitting: Family Medicine

## 2018-07-14 MED ORDER — UMECLIDINIUM-VILANTEROL 62.5-25 MCG/INH IN AEPB: 1.0000 | INHALATION_SPRAY | Freq: Every day | RESPIRATORY_TRACT | 3 refills | Status: AC

## 2018-07-14 NOTE — Telephone Encounter (Signed)
Copied from [place] [id number]. Topic: Quick Communication - Rx Refill/Question >> Jul 14, 2018  2:23 PM Scherrie Gerlach wrote: Medication: Anoro inhaler  Pt given sample of this inhaler in the office by Apolonio Schneiders and he really likes it works great. Pt would like a new Rx for this inhaler called into  [id number] - [place], [place] - [address] [phone] (Phone) [phone] (Fax)  Pt states his sample is all gone.

## 2018-07-16 ENCOUNTER — Inpatient Hospital Stay: Payer: Medicare Other

## 2018-07-16 ENCOUNTER — Other Ambulatory Visit: Payer: Self-pay

## 2018-07-16 ENCOUNTER — Inpatient Hospital Stay (HOSPITAL_BASED_OUTPATIENT_CLINIC_OR_DEPARTMENT_OTHER): Payer: Medicare Other | Admitting: Oncology

## 2018-07-16 ENCOUNTER — Inpatient Hospital Stay: Payer: Medicare Other | Attending: Oncology

## 2018-07-16 VITALS — BP 134/77 | Temp 97.6°F | Ht 69.5 in | Wt 129.8 lb

## 2018-07-16 DIAGNOSIS — I10 Essential (primary) hypertension: Secondary | ICD-10-CM | POA: Insufficient documentation

## 2018-07-16 DIAGNOSIS — Z905 Acquired absence of kidney: Secondary | ICD-10-CM

## 2018-07-16 DIAGNOSIS — F17210 Nicotine dependence, cigarettes, uncomplicated: Secondary | ICD-10-CM

## 2018-07-16 DIAGNOSIS — J449 Chronic obstructive pulmonary disease, unspecified: Secondary | ICD-10-CM

## 2018-07-16 DIAGNOSIS — N289 Disorder of kidney and ureter, unspecified: Secondary | ICD-10-CM

## 2018-07-16 DIAGNOSIS — F419 Anxiety disorder, unspecified: Secondary | ICD-10-CM

## 2018-07-16 DIAGNOSIS — Z5112 Encounter for antineoplastic immunotherapy: Secondary | ICD-10-CM

## 2018-07-16 DIAGNOSIS — C642 Malignant neoplasm of left kidney, except renal pelvis: Secondary | ICD-10-CM | POA: Diagnosis not present

## 2018-07-16 DIAGNOSIS — R634 Abnormal weight loss: Secondary | ICD-10-CM | POA: Diagnosis not present

## 2018-07-16 DIAGNOSIS — R590 Localized enlarged lymph nodes: Secondary | ICD-10-CM | POA: Diagnosis not present

## 2018-07-16 DIAGNOSIS — C9110 Chronic lymphocytic leukemia of B-cell type not having achieved remission: Secondary | ICD-10-CM | POA: Insufficient documentation

## 2018-07-16 DIAGNOSIS — Z5111 Encounter for antineoplastic chemotherapy: Secondary | ICD-10-CM

## 2018-07-16 DIAGNOSIS — M4802 Spinal stenosis, cervical region: Secondary | ICD-10-CM | POA: Insufficient documentation

## 2018-07-16 DIAGNOSIS — Z79899 Other long term (current) drug therapy: Secondary | ICD-10-CM | POA: Diagnosis not present

## 2018-07-16 DIAGNOSIS — R5383 Other fatigue: Secondary | ICD-10-CM | POA: Insufficient documentation

## 2018-07-16 DIAGNOSIS — R531 Weakness: Secondary | ICD-10-CM

## 2018-07-16 DIAGNOSIS — D649 Anemia, unspecified: Secondary | ICD-10-CM | POA: Insufficient documentation

## 2018-07-16 LAB — CBC WITH DIFFERENTIAL/PLATELET
Abs Immature Granulocytes: 0.05 10*3/uL (ref 0.00–0.07)
BASOS ABS: 0.1 10*3/uL (ref 0.0–0.1)
Basophils Relative: 1 %
Eosinophils Absolute: 1.3 10*3/uL — ABNORMAL HIGH (ref 0.0–0.5)
Eosinophils Relative: 12 %
HCT: 23.3 % — ABNORMAL LOW (ref 39.0–52.0)
Hemoglobin: 7.6 g/dL — ABNORMAL LOW (ref 13.0–17.0)
IMMATURE GRANULOCYTES: 0 %
Lymphocytes Relative: 24 %
Lymphs Abs: 2.7 10*3/uL (ref 0.7–4.0)
MCH: 36 pg — ABNORMAL HIGH (ref 26.0–34.0)
MCHC: 32.6 g/dL (ref 30.0–36.0)
MCV: 110.4 fL — ABNORMAL HIGH (ref 80.0–100.0)
Monocytes Absolute: 0.7 10*3/uL (ref 0.1–1.0)
Monocytes Relative: 7 %
NEUTROS PCT: 56 %
NRBC: 0 % (ref 0.0–0.2)
Neutro Abs: 6.4 10*3/uL (ref 1.7–7.7)
PLATELETS: 249 10*3/uL (ref 150–400)
RBC: 2.11 MIL/uL — ABNORMAL LOW (ref 4.22–5.81)
RDW: 15 % (ref 11.5–15.5)
WBC: 11.3 10*3/uL — AB (ref 4.0–10.5)

## 2018-07-16 LAB — COMPREHENSIVE METABOLIC PANEL
ALBUMIN: 4.3 g/dL (ref 3.5–5.0)
ALT: 9 U/L (ref 0–44)
AST: 17 U/L (ref 15–41)
Alkaline Phosphatase: 62 U/L (ref 38–126)
Anion gap: 9 (ref 5–15)
BILIRUBIN TOTAL: 0.6 mg/dL (ref 0.3–1.2)
BUN: 21 mg/dL (ref 8–23)
CO2: 16 mmol/L — ABNORMAL LOW (ref 22–32)
Calcium: 9.4 mg/dL (ref 8.9–10.3)
Chloride: 112 mmol/L — ABNORMAL HIGH (ref 98–111)
Creatinine, Ser: 1.95 mg/dL — ABNORMAL HIGH (ref 0.61–1.24)
GFR calc Af Amer: 39 mL/min — ABNORMAL LOW (ref >60)
GFR calc non Af Amer: 34 mL/min — ABNORMAL LOW (ref >60)
Glucose, Bld: 100 mg/dL — ABNORMAL HIGH (ref 70–99)
Potassium: 4.6 mmol/L (ref 3.5–5.1)
Sodium: 137 mmol/L (ref 135–145)
Total Protein: 6.9 g/dL (ref 6.5–8.1)

## 2018-07-16 MED ORDER — SODIUM CHLORIDE 0.9 % IV SOLN
Freq: Once | INTRAVENOUS | Status: AC
  Administered 2018-07-16: 09:00:00 via INTRAVENOUS
  Filled 2018-07-16: qty 250

## 2018-07-16 MED ORDER — DEXAMETHASONE SODIUM PHOSPHATE 10 MG/ML IJ SOLN
10.0000 mg | Freq: Once | INTRAMUSCULAR | Status: AC
  Administered 2018-07-16: 10 mg via INTRAVENOUS
  Filled 2018-07-16: qty 1

## 2018-07-16 MED ORDER — ACETAMINOPHEN 325 MG PO TABS
650.0000 mg | ORAL_TABLET | Freq: Once | ORAL | Status: AC
  Administered 2018-07-16: 650 mg via ORAL
  Filled 2018-07-16: qty 2

## 2018-07-16 MED ORDER — PALONOSETRON HCL INJECTION 0.25 MG/5ML
0.2500 mg | Freq: Once | INTRAVENOUS | Status: AC
  Administered 2018-07-16: 0.25 mg via INTRAVENOUS

## 2018-07-16 MED ORDER — SODIUM CHLORIDE 0.9 % IV SOLN
90.0000 mg/m2 | Freq: Once | INTRAVENOUS | Status: AC
  Administered 2018-07-16: 150 mg via INTRAVENOUS
  Filled 2018-07-16: qty 6

## 2018-07-16 MED ORDER — SODIUM CHLORIDE 0.9 % IV SOLN
375.0000 mg/m2 | Freq: Once | INTRAVENOUS | Status: AC
  Administered 2018-07-16: 600 mg via INTRAVENOUS
  Filled 2018-07-16: qty 10

## 2018-07-16 MED ORDER — DIPHENHYDRAMINE HCL 25 MG PO CAPS
25.0000 mg | ORAL_CAPSULE | Freq: Once | ORAL | Status: AC
  Administered 2018-07-16: 25 mg via ORAL
  Filled 2018-07-16: qty 1

## 2018-07-16 MED ORDER — HEPARIN SOD (PORK) LOCK FLUSH 100 UNIT/ML IV SOLN
500.0000 [IU] | Freq: Once | INTRAVENOUS | Status: AC
  Administered 2018-07-16: 500 [IU] via INTRAVENOUS

## 2018-07-16 MED ORDER — SODIUM CHLORIDE 0.9% FLUSH
10.0000 mL | INTRAVENOUS | Status: DC | PRN
  Administered 2018-07-16: 10 mL via INTRAVENOUS
  Filled 2018-07-16: qty 10

## 2018-07-16 MED ORDER — SODIUM CHLORIDE 0.9 % IV SOLN: 375.0000 mg/m2 | Freq: Once | INTRAVENOUS | Status: DC

## 2018-07-16 NOTE — Progress Notes (Signed)
Patient is here today to follow up on his leukemia. Patient stated that he has lost 2 lbs since 07/04/2018. He stated that his appetite is good but there are times that he is eats less.

## 2018-07-17 ENCOUNTER — Inpatient Hospital Stay: Payer: Medicare Other

## 2018-07-17 VITALS — BP 138/74 | HR 80 | Temp 95.1°F | Resp 17

## 2018-07-17 DIAGNOSIS — C9110 Chronic lymphocytic leukemia of B-cell type not having achieved remission: Secondary | ICD-10-CM | POA: Diagnosis not present

## 2018-07-17 DIAGNOSIS — R590 Localized enlarged lymph nodes: Secondary | ICD-10-CM | POA: Diagnosis not present

## 2018-07-17 DIAGNOSIS — C642 Malignant neoplasm of left kidney, except renal pelvis: Secondary | ICD-10-CM | POA: Diagnosis not present

## 2018-07-17 DIAGNOSIS — Z5112 Encounter for antineoplastic immunotherapy: Secondary | ICD-10-CM | POA: Diagnosis not present

## 2018-07-17 DIAGNOSIS — M4802 Spinal stenosis, cervical region: Secondary | ICD-10-CM | POA: Diagnosis not present

## 2018-07-17 DIAGNOSIS — Z5111 Encounter for antineoplastic chemotherapy: Secondary | ICD-10-CM | POA: Diagnosis not present

## 2018-07-17 MED ORDER — DEXAMETHASONE SODIUM PHOSPHATE 10 MG/ML IJ SOLN
10.0000 mg | Freq: Once | INTRAMUSCULAR | Status: AC
  Administered 2018-07-17: 10 mg via INTRAVENOUS
  Filled 2018-07-17: qty 1

## 2018-07-17 MED ORDER — SODIUM CHLORIDE 0.9% FLUSH
10.0000 mL | INTRAVENOUS | Status: DC | PRN
  Administered 2018-07-17: 10 mL
  Filled 2018-07-17: qty 10

## 2018-07-17 MED ORDER — HEPARIN SOD (PORK) LOCK FLUSH 100 UNIT/ML IV SOLN
500.0000 [IU] | Freq: Once | INTRAVENOUS | Status: AC | PRN
  Administered 2018-07-17: 500 [IU]
  Filled 2018-07-17: qty 5

## 2018-07-17 MED ORDER — SODIUM CHLORIDE 0.9 % IV SOLN
90.0000 mg/m2 | Freq: Once | INTRAVENOUS | Status: AC
  Administered 2018-07-17: 150 mg via INTRAVENOUS
  Filled 2018-07-17: qty 6

## 2018-07-17 MED ORDER — SODIUM CHLORIDE 0.9 % IV SOLN
Freq: Once | INTRAVENOUS | Status: AC
  Administered 2018-07-17: 09:00:00 via INTRAVENOUS
  Filled 2018-07-17: qty 250

## 2018-07-23 ENCOUNTER — Inpatient Hospital Stay: Payer: Medicare Other

## 2018-07-23 DIAGNOSIS — Z5111 Encounter for antineoplastic chemotherapy: Secondary | ICD-10-CM | POA: Diagnosis not present

## 2018-07-23 DIAGNOSIS — C9110 Chronic lymphocytic leukemia of B-cell type not having achieved remission: Secondary | ICD-10-CM | POA: Diagnosis not present

## 2018-07-23 DIAGNOSIS — C642 Malignant neoplasm of left kidney, except renal pelvis: Secondary | ICD-10-CM | POA: Diagnosis not present

## 2018-07-23 DIAGNOSIS — Z5112 Encounter for antineoplastic immunotherapy: Secondary | ICD-10-CM | POA: Diagnosis not present

## 2018-07-23 DIAGNOSIS — M4802 Spinal stenosis, cervical region: Secondary | ICD-10-CM | POA: Diagnosis not present

## 2018-07-23 DIAGNOSIS — R590 Localized enlarged lymph nodes: Secondary | ICD-10-CM | POA: Diagnosis not present

## 2018-07-23 LAB — CBC WITH DIFFERENTIAL/PLATELET
Abs Immature Granulocytes: 0.04 10*3/uL (ref 0.00–0.07)
Basophils Absolute: 0 10*3/uL (ref 0.0–0.1)
Basophils Relative: 0 %
Eosinophils Absolute: 0.9 10*3/uL — ABNORMAL HIGH (ref 0.0–0.5)
Eosinophils Relative: 16 %
HCT: 19.2 % — ABNORMAL LOW (ref 39.0–52.0)
Hemoglobin: 6.2 g/dL — ABNORMAL LOW (ref 13.0–17.0)
Immature Granulocytes: 1 %
Lymphocytes Relative: 17 %
Lymphs Abs: 0.9 10*3/uL (ref 0.7–4.0)
MCH: 35.6 pg — ABNORMAL HIGH (ref 26.0–34.0)
MCHC: 32.3 g/dL (ref 30.0–36.0)
MCV: 110.3 fL — ABNORMAL HIGH (ref 80.0–100.0)
Monocytes Absolute: 0.3 10*3/uL (ref 0.1–1.0)
Monocytes Relative: 6 %
NEUTROS PCT: 60 %
Neutro Abs: 3.3 10*3/uL (ref 1.7–7.7)
PLATELETS: 251 10*3/uL (ref 150–400)
RBC: 1.74 MIL/uL — AB (ref 4.22–5.81)
RDW: 14.5 % (ref 11.5–15.5)
WBC: 5.4 10*3/uL (ref 4.0–10.5)
nRBC: 0 % (ref 0.0–0.2)

## 2018-07-23 LAB — SAMPLE TO BLOOD BANK

## 2018-07-23 LAB — ABO/RH: ABO/RH(D): O NEG

## 2018-07-24 ENCOUNTER — Inpatient Hospital Stay: Payer: Medicare Other

## 2018-07-24 ENCOUNTER — Telehealth: Payer: Self-pay

## 2018-07-24 ENCOUNTER — Other Ambulatory Visit: Payer: Self-pay | Admitting: Oncology

## 2018-07-24 DIAGNOSIS — C9110 Chronic lymphocytic leukemia of B-cell type not having achieved remission: Secondary | ICD-10-CM

## 2018-07-24 DIAGNOSIS — D649 Anemia, unspecified: Secondary | ICD-10-CM

## 2018-07-24 DIAGNOSIS — Z5112 Encounter for antineoplastic immunotherapy: Secondary | ICD-10-CM | POA: Diagnosis not present

## 2018-07-24 DIAGNOSIS — R590 Localized enlarged lymph nodes: Secondary | ICD-10-CM | POA: Diagnosis not present

## 2018-07-24 DIAGNOSIS — C642 Malignant neoplasm of left kidney, except renal pelvis: Secondary | ICD-10-CM | POA: Diagnosis not present

## 2018-07-24 DIAGNOSIS — Z5111 Encounter for antineoplastic chemotherapy: Secondary | ICD-10-CM | POA: Diagnosis not present

## 2018-07-24 DIAGNOSIS — M4802 Spinal stenosis, cervical region: Secondary | ICD-10-CM | POA: Diagnosis not present

## 2018-07-24 LAB — TYPE AND SCREEN
ABO/RH(D): O NEG
Antibody Screen: POSITIVE
DAT, IgG: POSITIVE
DAT, complement: POSITIVE

## 2018-07-24 MED ORDER — ACETAMINOPHEN 325 MG PO TABS
650.0000 mg | ORAL_TABLET | Freq: Once | ORAL | Status: AC
  Administered 2018-07-24: 650 mg via ORAL
  Filled 2018-07-24: qty 2

## 2018-07-24 MED ORDER — SODIUM CHLORIDE 0.9% IV SOLUTION
250.0000 mL | Freq: Once | INTRAVENOUS | Status: AC
  Administered 2018-07-24: 250 mL via INTRAVENOUS
  Filled 2018-07-24: qty 250

## 2018-07-24 MED ORDER — SODIUM CHLORIDE 0.9% FLUSH
3.0000 mL | INTRAVENOUS | Status: AC | PRN
  Administered 2018-07-24: 3 mL
  Filled 2018-07-24: qty 3

## 2018-07-24 MED ORDER — HEPARIN SOD (PORK) LOCK FLUSH 100 UNIT/ML IV SOLN
500.0000 [IU] | Freq: Every day | INTRAVENOUS | Status: AC | PRN
  Administered 2018-07-24: 500 [IU]
  Filled 2018-07-24: qty 5

## 2018-07-24 MED ORDER — DIPHENHYDRAMINE HCL 50 MG/ML IJ SOLN
25.0000 mg | Freq: Once | INTRAMUSCULAR | Status: AC
  Administered 2018-07-24: 25 mg via INTRAVENOUS
  Filled 2018-07-24: qty 1

## 2018-07-24 NOTE — Progress Notes (Signed)
Bob from blood bank called @ 9:38 a.m. to report that patient has antibodies.

## 2018-07-24 NOTE — Progress Notes (Signed)
Pt reports to clinic today for one unit of blood transfusion. CBC 07/23/2018 shows that hemoglobin dropped to 6.2. Per [facility] CMA Per Dr. Grayland Ormond pt to receive 2 units of PRBCs instead of one. Pt agrees with plan. Orders released and premeds given as ordered.  1115: followed up with Shelly in blood bank regarding patients PRBCs and antibodies.  1200: Followed up with Shelly in blood bank, additional labs needed and PRBCS unlikely to be available today, new type and screen will be needed for transfusions to be given on Monday 07/27/2018.  Spoke with Dr. Grayland Ormond, MD aware that PRBCs will not be available today and that premeds were given/ orders were released prior to knowing the units would not be available. New orders will need to be placed for 07/27/2018. Per Dr. Grayland Ormond okay to discharge pt home and pt to return 07/27/2018 for 2 units of PRBCs. Pt agrees with plan and denies any s/s of distress at this time. Pt educated to report directly to ED if pt becomes symptomatic or develops s/s of distress including but not limited to increased SOB or chest pains, etc. Pt verbalizes understanding. Appt scheduled for 2 units of PRBCs on 07/27/2018 at 9 am and pt made aware.   Pt stable at discharge.

## 2018-07-24 NOTE — Telephone Encounter (Signed)
Meribeth Mattes, RN messaged me letting me know that the patient's hemoglobin had dropped to 6.2. Dr. Grayland Ormond was notified and he placed another order for a second unit of blood to be given to the patient today. Amy was notified.

## 2018-07-26 ENCOUNTER — Other Ambulatory Visit: Payer: Self-pay | Admitting: Oncology

## 2018-07-27 ENCOUNTER — Inpatient Hospital Stay: Payer: Medicare Other

## 2018-07-27 ENCOUNTER — Other Ambulatory Visit: Payer: Self-pay | Admitting: Oncology

## 2018-07-27 ENCOUNTER — Other Ambulatory Visit: Payer: PRIVATE HEALTH INSURANCE

## 2018-07-27 ENCOUNTER — Ambulatory Visit: Payer: PRIVATE HEALTH INSURANCE | Admitting: Oncology

## 2018-07-27 ENCOUNTER — Ambulatory Visit: Payer: Medicare Other

## 2018-07-27 DIAGNOSIS — C642 Malignant neoplasm of left kidney, except renal pelvis: Secondary | ICD-10-CM | POA: Diagnosis not present

## 2018-07-27 DIAGNOSIS — Z5112 Encounter for antineoplastic immunotherapy: Secondary | ICD-10-CM | POA: Diagnosis not present

## 2018-07-27 DIAGNOSIS — D649 Anemia, unspecified: Secondary | ICD-10-CM

## 2018-07-27 DIAGNOSIS — M4802 Spinal stenosis, cervical region: Secondary | ICD-10-CM | POA: Diagnosis not present

## 2018-07-27 DIAGNOSIS — C9110 Chronic lymphocytic leukemia of B-cell type not having achieved remission: Secondary | ICD-10-CM | POA: Diagnosis not present

## 2018-07-27 DIAGNOSIS — R590 Localized enlarged lymph nodes: Secondary | ICD-10-CM | POA: Diagnosis not present

## 2018-07-27 DIAGNOSIS — Z5111 Encounter for antineoplastic chemotherapy: Secondary | ICD-10-CM | POA: Diagnosis not present

## 2018-07-27 LAB — PREPARE RBC (CROSSMATCH)

## 2018-07-27 MED ORDER — DIPHENHYDRAMINE HCL 50 MG/ML IJ SOLN
25.0000 mg | Freq: Once | INTRAMUSCULAR | Status: AC
  Administered 2018-07-27: 25 mg via INTRAVENOUS
  Filled 2018-07-27: qty 1

## 2018-07-27 MED ORDER — HEPARIN SOD (PORK) LOCK FLUSH 100 UNIT/ML IV SOLN
500.0000 [IU] | Freq: Every day | INTRAVENOUS | Status: AC | PRN
  Administered 2018-07-27: 500 [IU]
  Filled 2018-07-27: qty 5

## 2018-07-27 MED ORDER — ACETAMINOPHEN 325 MG PO TABS: 650.0000 mg | ORAL_TABLET | Freq: Once | ORAL | Status: DC

## 2018-07-27 MED ORDER — SODIUM CHLORIDE 0.9% IV SOLUTION
250.0000 mL | Freq: Once | INTRAVENOUS | Status: AC
  Administered 2018-07-27: 250 mL via INTRAVENOUS
  Filled 2018-07-27: qty 250

## 2018-07-28 LAB — BPAM RBC
Blood Product Expiration Date: 202002092359
Blood Product Expiration Date: 202002092359
ISSUE DATE / TIME: 202001130919
ISSUE DATE / TIME: 202001131106
UNIT TYPE AND RH: 9500
Unit Type and Rh: 9500

## 2018-07-28 LAB — TYPE AND SCREEN
ABO/RH(D): O NEG
Antibody Screen: POSITIVE
DAT, IGG: POSITIVE
DAT, complement: POSITIVE
UNIT DIVISION: 0
Unit division: 0

## 2018-08-07 ENCOUNTER — Inpatient Hospital Stay: Payer: Medicare Other

## 2018-08-07 ENCOUNTER — Ambulatory Visit
Admission: RE | Admit: 2018-08-07 | Discharge: 2018-08-07 | Disposition: A | Discharge status: Home / Self Care | Payer: Medicare Other | Source: Ambulatory Visit | Attending: Oncology | Admitting: Oncology

## 2018-08-07 DIAGNOSIS — C652 Malignant neoplasm of left renal pelvis: Secondary | ICD-10-CM | POA: Diagnosis not present

## 2018-08-07 DIAGNOSIS — C9110 Chronic lymphocytic leukemia of B-cell type not having achieved remission: Secondary | ICD-10-CM | POA: Insufficient documentation

## 2018-08-07 DIAGNOSIS — M4802 Spinal stenosis, cervical region: Secondary | ICD-10-CM | POA: Diagnosis not present

## 2018-08-07 DIAGNOSIS — R590 Localized enlarged lymph nodes: Secondary | ICD-10-CM | POA: Diagnosis not present

## 2018-08-07 DIAGNOSIS — Z5111 Encounter for antineoplastic chemotherapy: Secondary | ICD-10-CM | POA: Diagnosis not present

## 2018-08-07 DIAGNOSIS — Z95828 Presence of other vascular implants and grafts: Secondary | ICD-10-CM

## 2018-08-07 DIAGNOSIS — C642 Malignant neoplasm of left kidney, except renal pelvis: Secondary | ICD-10-CM | POA: Diagnosis not present

## 2018-08-07 DIAGNOSIS — Z5112 Encounter for antineoplastic immunotherapy: Secondary | ICD-10-CM | POA: Diagnosis not present

## 2018-08-07 MED ORDER — IOHEXOL 300 MG/ML  SOLN
50.0000 mL | Freq: Once | INTRAMUSCULAR | Status: AC | PRN
  Administered 2018-08-07: 50 mL via INTRAVENOUS

## 2018-08-07 MED ORDER — HEPARIN SOD (PORK) LOCK FLUSH 100 UNIT/ML IV SOLN
500.0000 [IU] | Freq: Once | INTRAVENOUS | Status: AC
  Administered 2018-08-07: 500 [IU] via INTRAVENOUS

## 2018-08-07 MED ORDER — SODIUM CHLORIDE 0.9% FLUSH
10.0000 mL | INTRAVENOUS | Status: DC | PRN
  Administered 2018-08-07: 10 mL via INTRAVENOUS
  Filled 2018-08-07: qty 10

## 2018-08-07 MED ORDER — IOHEXOL 300 MG/ML  SOLN
100.0000 mL | Freq: Once | INTRAMUSCULAR | Status: AC | PRN
  Administered 2018-08-07: 50 mL via INTRAVENOUS

## 2018-08-07 MED ORDER — HEPARIN SOD (PORK) LOCK FLUSH 100 UNIT/ML IV SOLN
INTRAVENOUS | Status: AC
  Filled 2018-08-07: qty 5

## 2018-08-09 NOTE — Progress Notes (Signed)
Julian Medina  [phone] Fax:([phone]) [phone]  ID: Julian Medina OB: 11/25/1945  MR#: [id number]  GDJ#:[id number]  Patient Care Team: Guadalupe Maple, MD as PCP - General (Family Medicine) Lloyd Huger, MD as Consulting Physician (Oncology) [name], MD as Consulting Physician (Urology)  CHIEF COMPLAINT: CLL, 13q-  INTERVAL HISTORY: Patient returns to clinic today for further evaluation, discussion of his imaging results, and consideration of cycle 4 of Treanda and Rituxan.  He continues to have increased weakness and fatigue, but otherwise feels well. He continues to be highly anxious.  He denies any fevers or night sweats. He has no neurologic complaints. He denies any pain. He has no chest pain or shortness of breath. He denies any nausea, vomiting, constipation, or diarrhea. He has no urinary complaints.  Patient offers no further specific complaints today.  REVIEW OF SYSTEMS:   Review of Systems  Constitutional: Positive for malaise/fatigue. Negative for diaphoresis, fever and weight loss.  Respiratory: Negative.  Negative for cough and shortness of breath.   Cardiovascular: Negative.  Negative for chest pain and leg swelling.  Gastrointestinal: Negative.  Negative for abdominal pain and constipation.  Genitourinary: Negative.  Negative for dysuria.  Musculoskeletal: Negative.  Negative for back pain.  Skin: Negative.  Negative for rash.  Neurological: Positive for weakness. Negative for sensory change, focal weakness and headaches.  Psychiatric/Behavioral: The patient is nervous/anxious.     As per HPI. Otherwise, a complete review of systems is negative.  PAST MEDICAL HISTORY: Past Medical History:  Diagnosis Date  . Anxiety   . CLL (chronic lymphocytic leukemia) ([place])   . COPD (chronic obstructive pulmonary disease) ([place])   . Hypertension     PAST SURGICAL HISTORY: Past Surgical History:  Procedure Laterality Date  . HERNIA  REPAIR    . PERIPHERAL VASCULAR CATHETERIZATION N/A 03/14/2016   Procedure: Glori [facility] Cath Insertion;  Surgeon: Algernon Huxley, MD;  Location: [facility];  Service: Cardiovascular;  Laterality: N/A;  . ROBOTIC ASSITED PARTIAL NEPHRECTOMY Left 11/14/2015   Procedure: ROBOTIC ASSITED PARTIAL NEPHRECTOMY with intraoperative drop in ultrasound / Extensive Lysis of adhesions;  Surgeon: [name], MD;  Location: [facility];  Service: Urology;  Laterality: Left;    FAMILY HISTORY: Heart disease and hypertension.     ADVANCED DIRECTIVES:    HEALTH MAINTENANCE: Social History   Tobacco Use  . Smoking status: Current Every Day Smoker    Packs/day: 1.00    Years: 40.00    Pack years: 40.00    Types: Cigarettes  . Smokeless tobacco: Never Used  Substance Use Topics  . Alcohol use: Yes    Alcohol/week: 7.0 standard drinks    Types: 7 Cans of beer per week  . Drug use: No     Colonoscopy:  PAP:  Bone density:  Lipid panel:  Allergies  Allergen Reactions  . Aspirin Other (See Comments)    Ulcer   . Hydrochlorothiazide Itching    Current Outpatient Medications  Medication Sig Dispense Refill  . amLODipine (NORVASC) 10 MG tablet Take 1 tablet (10 mg total) by mouth daily. 90 tablet 4  . benazepril (LOTENSIN) 40 MG tablet Take 1 tablet (40 mg total) by mouth daily. 90 tablet 4  . Fluticasone-Salmeterol (ADVAIR DISKUS) 250-50 MCG/DOSE AEPB Inhale 1 puff into the lungs 2 (two) times daily. 180 each 4  . ipratropium (ATROVENT) 0.03 % nasal spray Place 2 sprays into both nostrils every 12 (twelve) hours. 30 mL 12  .  levocetirizine (XYZAL) 5 MG tablet Take 1 tablet (5 mg total) by mouth every evening. 30 tablet 5  . lidocaine-prilocaine (EMLA) cream Apply 1 application topically as needed. 30 g 3  . LORazepam (ATIVAN) 1 MG tablet Take 1 tablet (1 mg total) by mouth daily as needed for anxiety. 30 tablet 0  . megestrol (MEGACE) 40 MG tablet TAKE 1 TABLET BY MOUTH EVERY DAY 30 tablet 2    . pantoprazole (PROTONIX) 40 MG tablet Take 1 tablet (40 mg total) by mouth daily. 90 tablet 4  . PROAIR HFA 108 (90 Base) MCG/ACT inhaler TAKE 2 PUFFS BY MOUTH EVERY 6 HOURS AS NEEDED FOR WHEEZE OR SHORTNESS OF BREATH 8.5 Inhaler 6  . umeclidinium-vilanterol (ANORO ELLIPTA) 62.5-25 MCG/INH AEPB Inhale 1 puff into the lungs daily. 60 each 3   Current Facility-Administered Medications  Medication Dose Route Frequency Provider Last Rate Last Dose  . albuterol (PROVENTIL) (2.5 MG/3ML) 0.083% nebulizer solution 2.5 mg  2.5 mg Nebulization Once Volney American, PA-C       Facility-Administered Medications Ordered in Other Visits  Medication Dose Route Frequency Provider Last Rate Last Dose  . sodium chloride flush (NS) 0.9 % injection 10 mL  10 mL Intravenous PRN Lloyd Huger, MD   10 mL at 01/26/18 1515    OBJECTIVE: Vitals:   08/13/18 0843  BP: 131/72  Pulse: 98  Temp: (!) 97.4 F (36.3 C)     Body mass index is 19.71 kg/m.    ECOG FS:0 - Asymptomatic  General: Well-developed, well-nourished, no acute distress. Eyes: Pink conjunctiva, anicteric sclera. HEENT: Normocephalic, moist mucous membranes. Lungs: Clear to auscultation bilaterally. Heart: Regular rate and rhythm. No rubs, murmurs, or gallops. Abdomen: Soft, nontender, nondistended. No organomegaly noted, normoactive bowel sounds. Musculoskeletal: No edema, cyanosis, or clubbing. Neuro: Alert, answering all questions appropriately. Cranial nerves grossly intact. Skin: No rashes or petechiae noted. Psych: Normal affect.  LAB RESULTS:  Lab Results  Component Value Date   NA 138 08/13/2018   K 4.6 08/13/2018   CL 111 08/13/2018   CO2 18 (L) 08/13/2018   GLUCOSE 90 08/13/2018   BUN 25 (H) 08/13/2018   CREATININE 2.07 (H) 08/13/2018   CALCIUM 9.3 08/13/2018   PROT 6.9 08/13/2018   ALBUMIN 4.2 08/13/2018   AST 21 08/13/2018   ALT 20 08/13/2018   ALKPHOS 65 08/13/2018   BILITOT 1.0 08/13/2018   GFRNONAA  31 (L) 08/13/2018   GFRAA 36 (L) 08/13/2018    Lab Results  Component Value Date   WBC 11.0 (H) 08/13/2018   NEUTROABS 7.9 (H) 08/13/2018   HGB 6.7 (L) 08/13/2018   HCT 20.5 (L) 08/13/2018   MCV 109.6 (H) 08/13/2018   PLT 203 08/13/2018     STUDIES: Ct Soft Tissue Neck W Contrast  Result Date: 08/07/2018 CLINICAL DATA:  73 y/o M; follow-up of chronic lymphocytic leukemia. History of left-sided renal cell carcinoma. EXAM: CT NECK WITH CONTRAST TECHNIQUE: Multidetector CT imaging of the neck was performed using the standard protocol following the bolus administration of intravenous contrast. CONTRAST:  60mL OMNIPAQUE IOHEXOL 300 MG/ML  SOLN COMPARISON:  05/08/2018, 02/17/2017, 03/05/2016 CT of the neck. Concurrent CT of chest, abdomen, and pelvis. FINDINGS: Pharynx and larynx: Normal. No mass or swelling. Salivary glands: No inflammation, mass, or stone. Thyroid: Normal. Lymph nodes: Diffusely decreased cervical lymphadenopathy with reference nodes as follows: 1. Right supraclavicular, 6 mm short axis, previously 7 mm, series 14, image 67. 2. Left level 2B, 5 mm  short axis, previously 9 mm, series 14, image 43. 3. Left level 3, 5 mm short axis, previously 9 mm, series 14, image 52. Vascular: Mixed plaque of the left carotid bifurcation with moderate proximal ICA stenosis. Limited intracranial: Negative. Visualized orbits: Negative. Mastoids and visualized paranasal sinuses: Clear. Skeleton: Stable cervical spondylosis with advanced discogenic degenerative changes at the C3-C7 level or uncovertebral and facet hypertrophy contributes to bony foraminal stenosis bilaterally. No high-grade bony spinal canal stenosis. Upper chest: Please refer to concurrent CT of chest for evaluation of axilla, lungs, mediastinum. Other: None. IMPRESSION: Decreased cervical lymphadenopathy. Please refer to concurrent CT of chest for evaluation axillary and mediastinal adenopathy. Electronically Signed   By: Kristine Garbe M.D.   On: 08/07/2018 14:25   Ct Chest W Contrast  Result Date: 08/07/2018 CLINICAL DATA:  Chronic lymphocytic leukemia. LEFT-sided renal cell carcinoma. EXAM: CT CHEST, ABDOMEN, AND PELVIS WITH CONTRAST TECHNIQUE: Multidetector CT imaging of the chest, abdomen and pelvis was performed following the standard protocol during bolus administration of intravenous contrast. CONTRAST:  29mL OMNIPAQUE IOHEXOL 300 MG/ML  SOLN COMPARISON:  05/08/2018 FINDINGS: CT CHEST FINDINGS Cardiovascular: Port in the anterior chest wall with tip in distal SVC. Mediastinum/Nodes: Interval decrease in size of axial lymph nodes. For example 10 mm short axis lymph node in the RIGHT axilla decreased from 23 mm. Similar findings LEFT axilla. No pathologically enlarged mediastinal nodes. Hilar lymph nodes are decreased in size from prior. These are no longer pathologically enlarged where previously measured 2 cm. No pericardial effusion. Lungs/Pleura: No suspicious pulmonary nodules. Musculoskeletal: No aggressive osseous lesion. CT ABDOMEN AND PELVIS FINDINGS Hepatobiliary: No focal hepatic lesion.  Gallbladder normal. Pancreas: Pancreas is normal. No ductal dilatation. No pancreatic inflammation. Spleen: Normal spleen volume. Adrenals/urinary tract: Adrenal glands are normal. Low-density lesion extending from the lower pole of the LEFT kidney measuring 11 mm (image 37/2 can not be fully characterize but is unchanged in size from comparison exam. The ureters and bladder normal. Stomach/Bowel: Stomach, small bowel, appendix, and cecum are normal. The colon and rectosigmoid colon are normal. Vascular/Lymphatic: Abdominal aorta normal caliber. Interval reduction in size of LEFT periaortic lymph nodes. For example 10 mm short axis lymph node on image 77/2 decreased from 30 mm. Significant decrease in iliac adenopathy. The largest remaining lymph node is a RIGHT external iliac lymph node measuring 20 mm short axis decreased  from 46 mm. Reproductive: Prostate normal Other: No free fluid. Musculoskeletal: No aggressive osseous lesion. IMPRESSION: Chest Impression: 1. Interval decrease in size of axillary and hilar lymphadenopathy. Resolution of mediastinal adenopathy. 2. No evidence of disease progression. 3. Lungs are clear. Abdomen / Pelvis Impression: 1. Interval marked decrease in retroperitoneal and iliac adenopathy. Enlarged RIGHT external iliac lymph nodes remain (but decreased as above). 2. Normal volume spleen. 3. No evidence disease progression. 4. Stable small indeterminate lesion LEFT kidney. Electronically Signed   By: Suzy Bouchard M.D.   On: 08/07/2018 15:51   Ct Abdomen Pelvis W Contrast  Result Date: 08/07/2018 CLINICAL DATA:  Chronic lymphocytic leukemia. LEFT-sided renal cell carcinoma. EXAM: CT CHEST, ABDOMEN, AND PELVIS WITH CONTRAST TECHNIQUE: Multidetector CT imaging of the chest, abdomen and pelvis was performed following the standard protocol during bolus administration of intravenous contrast. CONTRAST:  34mL OMNIPAQUE IOHEXOL 300 MG/ML  SOLN COMPARISON:  05/08/2018 FINDINGS: CT CHEST FINDINGS Cardiovascular: Port in the anterior chest wall with tip in distal SVC. Mediastinum/Nodes: Interval decrease in size of axial lymph nodes. For example 10 mm short axis lymph  node in the RIGHT axilla decreased from 23 mm. Similar findings LEFT axilla. No pathologically enlarged mediastinal nodes. Hilar lymph nodes are decreased in size from prior. These are no longer pathologically enlarged where previously measured 2 cm. No pericardial effusion. Lungs/Pleura: No suspicious pulmonary nodules. Musculoskeletal: No aggressive osseous lesion. CT ABDOMEN AND PELVIS FINDINGS Hepatobiliary: No focal hepatic lesion.  Gallbladder normal. Pancreas: Pancreas is normal. No ductal dilatation. No pancreatic inflammation. Spleen: Normal spleen volume. Adrenals/urinary tract: Adrenal glands are normal. Low-density lesion extending  from the lower pole of the LEFT kidney measuring 11 mm (image 37/2 can not be fully characterize but is unchanged in size from comparison exam. The ureters and bladder normal. Stomach/Bowel: Stomach, small bowel, appendix, and cecum are normal. The colon and rectosigmoid colon are normal. Vascular/Lymphatic: Abdominal aorta normal caliber. Interval reduction in size of LEFT periaortic lymph nodes. For example 10 mm short axis lymph node on image 77/2 decreased from 30 mm. Significant decrease in iliac adenopathy. The largest remaining lymph node is a RIGHT external iliac lymph node measuring 20 mm short axis decreased from 46 mm. Reproductive: Prostate normal Other: No free fluid. Musculoskeletal: No aggressive osseous lesion. IMPRESSION: Chest Impression: 1. Interval decrease in size of axillary and hilar lymphadenopathy. Resolution of mediastinal adenopathy. 2. No evidence of disease progression. 3. Lungs are clear. Abdomen / Pelvis Impression: 1. Interval marked decrease in retroperitoneal and iliac adenopathy. Enlarged RIGHT external iliac lymph nodes remain (but decreased as above). 2. Normal volume spleen. 3. No evidence disease progression. 4. Stable small indeterminate lesion LEFT kidney. Electronically Signed   By: Suzy Bouchard M.D.   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   PLAN:    1.  CLL: Flow cytometry confirmed the diagnosis. Patient is also ZAP-70 positive this which indicates it may be a more aggressive form of CLL although FISH panel revealed 13q- which tends to have the best prognosis.  CT scan results from August 07, 2018 reviewed independently and reported as above with significant improvement of his known lymphadenopathy.  Initial plan was to proceed with cycle 4 of Rituxan and Treanda, but given his persistent anemia, will discontinue treatment at this time and proceed with 2 units packed red  blood cells.  Return to clinic in 2 weeks with repeat laboratory work and further evaluation.   2.  Anxiety: Continue Xanax PRN. 3.  Renal cell carcinoma: Patient underwent partial left nephrectomy on Nov 14, 2015.  CT scan results as above without any evidence of recurrent or progressive disease.  He was noted to have a stable small indeterminate lesion on his left kidney.  Continue follow-up with urology as needed.   4.  Anemia: Patient's hemoglobin has significantly decreased and he is symptomatic.  Return to clinic tomorrow for 2 units packed red blood cells. 5.  Renal insufficiency: Patient's creatinine has trended up slightly to 2.07.  Consider nephrology referral. 6.  Leukocytosis: Improving with treatment, monitor.  Patient expressed understanding and was in agreement with this plan. He also understands that He can call clinic at any time with any questions, concerns, or complaints.    Lloyd Huger, MD   08/14/2018 6:29 AM

## 2018-08-12 ENCOUNTER — Other Ambulatory Visit: Payer: Self-pay | Admitting: *Deleted

## 2018-08-12 DIAGNOSIS — D649 Anemia, unspecified: Secondary | ICD-10-CM

## 2018-08-13 ENCOUNTER — Other Ambulatory Visit: Payer: Self-pay | Admitting: *Deleted

## 2018-08-13 ENCOUNTER — Inpatient Hospital Stay: Payer: Medicare Other

## 2018-08-13 ENCOUNTER — Inpatient Hospital Stay (HOSPITAL_BASED_OUTPATIENT_CLINIC_OR_DEPARTMENT_OTHER): Payer: Medicare Other | Admitting: Oncology

## 2018-08-13 ENCOUNTER — Other Ambulatory Visit: Payer: Self-pay

## 2018-08-13 VITALS — BP 131/72 | HR 98 | Temp 97.4°F | Wt 135.4 lb

## 2018-08-13 DIAGNOSIS — F17210 Nicotine dependence, cigarettes, uncomplicated: Secondary | ICD-10-CM

## 2018-08-13 DIAGNOSIS — R531 Weakness: Secondary | ICD-10-CM | POA: Diagnosis not present

## 2018-08-13 DIAGNOSIS — D649 Anemia, unspecified: Secondary | ICD-10-CM

## 2018-08-13 DIAGNOSIS — Z5112 Encounter for antineoplastic immunotherapy: Secondary | ICD-10-CM | POA: Diagnosis not present

## 2018-08-13 DIAGNOSIS — C9110 Chronic lymphocytic leukemia of B-cell type not having achieved remission: Secondary | ICD-10-CM

## 2018-08-13 DIAGNOSIS — C642 Malignant neoplasm of left kidney, except renal pelvis: Secondary | ICD-10-CM

## 2018-08-13 DIAGNOSIS — R5383 Other fatigue: Secondary | ICD-10-CM | POA: Diagnosis not present

## 2018-08-13 DIAGNOSIS — J449 Chronic obstructive pulmonary disease, unspecified: Secondary | ICD-10-CM

## 2018-08-13 DIAGNOSIS — M4802 Spinal stenosis, cervical region: Secondary | ICD-10-CM

## 2018-08-13 DIAGNOSIS — N289 Disorder of kidney and ureter, unspecified: Secondary | ICD-10-CM | POA: Diagnosis not present

## 2018-08-13 DIAGNOSIS — R634 Abnormal weight loss: Secondary | ICD-10-CM | POA: Diagnosis not present

## 2018-08-13 DIAGNOSIS — R590 Localized enlarged lymph nodes: Secondary | ICD-10-CM | POA: Diagnosis not present

## 2018-08-13 DIAGNOSIS — F419 Anxiety disorder, unspecified: Secondary | ICD-10-CM

## 2018-08-13 DIAGNOSIS — Z5111 Encounter for antineoplastic chemotherapy: Secondary | ICD-10-CM | POA: Diagnosis not present

## 2018-08-13 DIAGNOSIS — I10 Essential (primary) hypertension: Secondary | ICD-10-CM

## 2018-08-13 DIAGNOSIS — Z905 Acquired absence of kidney: Secondary | ICD-10-CM

## 2018-08-13 DIAGNOSIS — Z79899 Other long term (current) drug therapy: Secondary | ICD-10-CM

## 2018-08-13 LAB — CBC WITH DIFFERENTIAL/PLATELET
Abs Immature Granulocytes: 0.1 10*3/uL — ABNORMAL HIGH (ref 0.00–0.07)
Basophils Absolute: 0.1 10*3/uL (ref 0.0–0.1)
Basophils Relative: 1 %
Eosinophils Absolute: 1 10*3/uL — ABNORMAL HIGH (ref 0.0–0.5)
Eosinophils Relative: 9 %
HCT: 20.5 % — ABNORMAL LOW (ref 39.0–52.0)
Hemoglobin: 6.7 g/dL — ABNORMAL LOW (ref 13.0–17.0)
Immature Granulocytes: 1 %
Lymphocytes Relative: 12 %
Lymphs Abs: 1.3 10*3/uL (ref 0.7–4.0)
MCH: 35.8 pg — ABNORMAL HIGH (ref 26.0–34.0)
MCHC: 32.7 g/dL (ref 30.0–36.0)
MCV: 109.6 fL — ABNORMAL HIGH (ref 80.0–100.0)
Monocytes Absolute: 0.6 10*3/uL (ref 0.1–1.0)
Monocytes Relative: 6 %
Neutro Abs: 7.9 10*3/uL — ABNORMAL HIGH (ref 1.7–7.7)
Neutrophils Relative %: 71 %
PLATELETS: 203 10*3/uL (ref 150–400)
RBC: 1.87 MIL/uL — ABNORMAL LOW (ref 4.22–5.81)
RDW: 17.8 % — ABNORMAL HIGH (ref 11.5–15.5)
WBC: 11 10*3/uL — ABNORMAL HIGH (ref 4.0–10.5)
nRBC: 0 % (ref 0.0–0.2)

## 2018-08-13 LAB — COMPREHENSIVE METABOLIC PANEL
ALBUMIN: 4.2 g/dL (ref 3.5–5.0)
ALT: 20 U/L (ref 0–44)
AST: 21 U/L (ref 15–41)
Alkaline Phosphatase: 65 U/L (ref 38–126)
Anion gap: 9 (ref 5–15)
BUN: 25 mg/dL — ABNORMAL HIGH (ref 8–23)
CO2: 18 mmol/L — ABNORMAL LOW (ref 22–32)
Calcium: 9.3 mg/dL (ref 8.9–10.3)
Chloride: 111 mmol/L (ref 98–111)
Creatinine, Ser: 2.07 mg/dL — ABNORMAL HIGH (ref 0.61–1.24)
GFR calc Af Amer: 36 mL/min — ABNORMAL LOW (ref >60)
GFR calc non Af Amer: 31 mL/min — ABNORMAL LOW (ref >60)
Glucose, Bld: 90 mg/dL (ref 70–99)
Potassium: 4.6 mmol/L (ref 3.5–5.1)
Sodium: 138 mmol/L (ref 135–145)
Total Bilirubin: 1 mg/dL (ref 0.3–1.2)
Total Protein: 6.9 g/dL (ref 6.5–8.1)

## 2018-08-13 LAB — SAMPLE TO BLOOD BANK

## 2018-08-13 LAB — PREPARE RBC (CROSSMATCH)

## 2018-08-13 MED ORDER — DIPHENHYDRAMINE HCL 50 MG/ML IJ SOLN
25.0000 mg | Freq: Once | INTRAMUSCULAR | Status: DC
  Filled 2018-08-13: qty 1

## 2018-08-13 MED ORDER — SODIUM CHLORIDE 0.9% IV SOLUTION
250.0000 mL | Freq: Once | INTRAVENOUS | Status: DC
  Filled 2018-08-13: qty 250

## 2018-08-13 MED ORDER — SODIUM CHLORIDE 0.9% FLUSH
10.0000 mL | Freq: Once | INTRAVENOUS | Status: AC
  Administered 2018-08-13: 10 mL via INTRAVENOUS
  Filled 2018-08-13: qty 10

## 2018-08-13 MED ORDER — HEPARIN SOD (PORK) LOCK FLUSH 100 UNIT/ML IV SOLN
500.0000 [IU] | Freq: Once | INTRAVENOUS | Status: AC
  Administered 2018-08-13: 500 [IU] via INTRAVENOUS
  Filled 2018-08-13: qty 5

## 2018-08-13 MED ORDER — ACETAMINOPHEN 325 MG PO TABS
650.0000 mg | ORAL_TABLET | Freq: Once | ORAL | Status: DC
  Filled 2018-08-13: qty 2

## 2018-08-13 NOTE — Progress Notes (Signed)
MD, Dr. Grayland Ormond, notified of conditional blood release request from blood bank. MD acknowledged and signed form. Per MD order: Patient returning to clinic for 2 units pRBCs transfusions with additional pre-medications tomorrow, 08/14/2018.

## 2018-08-13 NOTE — Progress Notes (Signed)
Patient is here today to follow up on his symptomatic anemia. Patient stated that he had been doing well. Patient denied fever, chills, constipation or diarrhea.

## 2018-08-14 ENCOUNTER — Inpatient Hospital Stay: Payer: Medicare Other

## 2018-08-14 VITALS — BP 104/59 | HR 86 | Temp 96.6°F | Resp 18

## 2018-08-14 DIAGNOSIS — C9110 Chronic lymphocytic leukemia of B-cell type not having achieved remission: Secondary | ICD-10-CM

## 2018-08-14 DIAGNOSIS — M4802 Spinal stenosis, cervical region: Secondary | ICD-10-CM | POA: Diagnosis not present

## 2018-08-14 DIAGNOSIS — C642 Malignant neoplasm of left kidney, except renal pelvis: Secondary | ICD-10-CM | POA: Diagnosis not present

## 2018-08-14 DIAGNOSIS — R590 Localized enlarged lymph nodes: Secondary | ICD-10-CM | POA: Diagnosis not present

## 2018-08-14 DIAGNOSIS — Z5112 Encounter for antineoplastic immunotherapy: Secondary | ICD-10-CM | POA: Diagnosis not present

## 2018-08-14 DIAGNOSIS — Z5111 Encounter for antineoplastic chemotherapy: Secondary | ICD-10-CM | POA: Diagnosis not present

## 2018-08-14 MED ORDER — DIPHENHYDRAMINE HCL 25 MG PO CAPS
25.0000 mg | ORAL_CAPSULE | Freq: Once | ORAL | Status: AC
  Administered 2018-08-14: 25 mg via ORAL
  Filled 2018-08-14: qty 1

## 2018-08-14 MED ORDER — SODIUM CHLORIDE 0.9% IV SOLUTION
250.0000 mL | Freq: Once | INTRAVENOUS | Status: AC
  Administered 2018-08-14: 250 mL via INTRAVENOUS
  Filled 2018-08-14: qty 250

## 2018-08-14 MED ORDER — ACETAMINOPHEN 325 MG PO TABS
650.0000 mg | ORAL_TABLET | Freq: Once | ORAL | Status: AC
  Administered 2018-08-14: 650 mg via ORAL
  Filled 2018-08-14: qty 2

## 2018-08-14 MED ORDER — METHYLPREDNISOLONE SODIUM SUCC 125 MG IJ SOLR
40.0000 mg | Freq: Once | INTRAMUSCULAR | Status: AC
  Administered 2018-08-14: 40 mg via INTRAVENOUS
  Filled 2018-08-14: qty 2

## 2018-08-14 MED ORDER — HEPARIN SOD (PORK) LOCK FLUSH 100 UNIT/ML IV SOLN
500.0000 [IU] | Freq: Once | INTRAVENOUS | Status: AC
  Administered 2018-08-14: 500 [IU] via INTRAVENOUS

## 2018-08-16 LAB — TYPE AND SCREEN
ABO/RH(D): O NEG
Antibody Screen: POSITIVE
DAT, IgG: POSITIVE
DAT, complement: POSITIVE
UNIT DIVISION: 0
Unit division: 0

## 2018-08-16 LAB — BPAM RBC
BLOOD PRODUCT EXPIRATION DATE: 202002112359
BLOOD PRODUCT EXPIRATION DATE: 202002242359
ISSUE DATE / TIME: 202001310909
ISSUE DATE / TIME: 202001311109
Unit Type and Rh: 9500
Unit Type and Rh: 9500

## 2018-08-21 NOTE — Progress Notes (Deleted)
[facility]  [phone] [phone]) [phone]  ID: DIEGO ULBRICHT OB: [birth year]  MR#: [id number]  VPX#:[id number]  Patient Care Team: Guadalupe Maple, MD as PCP - General (Family Medicine) Lloyd Huger, MD as Consulting Physician (Oncology) [name], MD as Consulting Physician (Urology)  CHIEF COMPLAINT: CLL, 13q-  INTERVAL HISTORY: Patient returns to clinic today for further evaluation, discussion of his imaging results, and consideration of cycle 4 of Treanda and Rituxan.  He continues to have increased weakness and fatigue, but otherwise feels well. He continues to be highly anxious.  He denies any fevers or night sweats. He has no neurologic complaints. He denies any pain. He has no chest pain or shortness of breath. He denies any nausea, vomiting, constipation, or diarrhea. He has no urinary complaints.  Patient offers no further specific complaints today.  REVIEW OF SYSTEMS:   Review of Systems  Constitutional: Positive for malaise/fatigue. Negative for diaphoresis, fever and weight loss.  Respiratory: Negative.  Negative for cough and shortness of breath.   Cardiovascular: Negative.  Negative for chest pain and leg swelling.  Gastrointestinal: Negative.  Negative for abdominal pain and constipation.  Genitourinary: Negative.  Negative for dysuria.  Musculoskeletal: Negative.  Negative for back pain.  Skin: Negative.  Negative for rash.  Neurological: Positive for weakness. Negative for sensory change, focal weakness and headaches.  Psychiatric/Behavioral: The patient is nervous/anxious.     As per HPI. Otherwise, a complete review of systems is negative.  PAST MEDICAL HISTORY: Past Medical History:  Diagnosis Date  . Anxiety   . CLL (chronic lymphocytic leukemia) ([place])   . COPD (chronic obstructive pulmonary disease) ([place])   . Hypertension     PAST SURGICAL HISTORY: Past Surgical History:  Procedure Laterality Date  . HERNIA  REPAIR    . PERIPHERAL VASCULAR CATHETERIZATION N/A 03/14/2016   Procedure: Glori Luis Cath Insertion;  Surgeon: Algernon Huxley, MD;  Location: [facility];  Service: Cardiovascular;  Laterality: N/A;  . ROBOTIC ASSITED PARTIAL NEPHRECTOMY Left 11/14/2015   Procedure: ROBOTIC ASSITED PARTIAL NEPHRECTOMY with intraoperative drop in ultrasound / Extensive Lysis of adhesions;  Surgeon: [name], MD;  Location: [facility];  Service: Urology;  Laterality: Left;    FAMILY HISTORY: Heart disease and hypertension.     ADVANCED DIRECTIVES:    HEALTH MAINTENANCE: Social History   Tobacco Use  . Smoking status: Current Every Day Smoker    Packs/day: 1.00    Years: 40.00    Pack years: 40.00    Types: Cigarettes  . Smokeless tobacco: Never Used  Substance Use Topics  . Alcohol use: Yes    Alcohol/week: 7.0 standard drinks    Types: 7 Cans of beer per week  . Drug use: No     Colonoscopy:  PAP:  Bone density:  Lipid panel:  Allergies  Allergen Reactions  . Aspirin Other (See Comments)    Ulcer   . Hydrochlorothiazide Itching    Current Outpatient Medications  Medication Sig Dispense Refill  . amLODipine (NORVASC) 10 MG tablet Take 1 tablet (10 mg total) by mouth daily. 90 tablet 4  . benazepril (LOTENSIN) 40 MG tablet Take 1 tablet (40 mg total) by mouth daily. 90 tablet 4  . Fluticasone-Salmeterol (ADVAIR DISKUS) 250-50 MCG/DOSE AEPB Inhale 1 puff into the lungs 2 (two) times daily. 180 each 4  . ipratropium (ATROVENT) 0.03 % nasal spray Place 2 sprays into both nostrils every 12 (twelve) hours. 30 mL 12  .  levocetirizine (XYZAL) 5 MG tablet Take 1 tablet (5 mg total) by mouth every evening. 30 tablet 5  . lidocaine-prilocaine (EMLA) cream Apply 1 application topically as needed. 30 g 3  . LORazepam (ATIVAN) 1 MG tablet Take 1 tablet (1 mg total) by mouth daily as needed for anxiety. 30 tablet 0  . megestrol (MEGACE) 40 MG tablet TAKE 1 TABLET BY MOUTH EVERY DAY 30 tablet 2    . pantoprazole (PROTONIX) 40 MG tablet Take 1 tablet (40 mg total) by mouth daily. 90 tablet 4  . PROAIR HFA 108 (90 Base) MCG/ACT inhaler TAKE 2 PUFFS BY MOUTH EVERY 6 HOURS AS NEEDED FOR WHEEZE OR SHORTNESS OF BREATH 8.5 Inhaler 6  . umeclidinium-vilanterol (ANORO ELLIPTA) 62.5-25 MCG/INH AEPB Inhale 1 puff into the lungs daily. 60 each 3   Current Facility-Administered Medications  Medication Dose Route Frequency Provider Last Rate Last Dose  . albuterol (PROVENTIL) (2.5 MG/3ML) 0.083% nebulizer solution 2.5 mg  2.5 mg Nebulization Once Volney American, PA-C       Facility-Administered Medications Ordered in Other Visits  Medication Dose Route Frequency Provider Last Rate Last Dose  . sodium chloride flush (NS) 0.9 % injection 10 mL  10 mL Intravenous PRN Lloyd Huger, MD   10 mL at 01/26/18 1515    OBJECTIVE: There were no vitals filed for this visit.   There is no height or weight on file to calculate BMI.    ECOG FS:0 - Asymptomatic  General: Well-developed, well-nourished, no acute distress. Eyes: Pink conjunctiva, anicteric sclera. HEENT: Normocephalic, moist mucous membranes. Lungs: Clear to auscultation bilaterally. Heart: Regular rate and rhythm. No rubs, murmurs, or gallops. Abdomen: Soft, nontender, nondistended. No organomegaly noted, normoactive bowel sounds. Musculoskeletal: No edema, cyanosis, or clubbing. Neuro: Alert, answering all questions appropriately. Cranial nerves grossly intact. Skin: No rashes or petechiae noted. Psych: Normal affect.  LAB RESULTS:  Lab Results  Component Value Date   NA 138 08/13/2018   K 4.6 08/13/2018   CL 111 08/13/2018   CO2 18 (L) 08/13/2018   GLUCOSE 90 08/13/2018   BUN 25 (H) 08/13/2018   CREATININE 2.07 (H) 08/13/2018   CALCIUM 9.3 08/13/2018   PROT 6.9 08/13/2018   ALBUMIN 4.2 08/13/2018   AST 21 08/13/2018   ALT 20 08/13/2018   ALKPHOS 65 08/13/2018   BILITOT 1.0 08/13/2018   GFRNONAA 31 (L)  08/13/2018   GFRAA 36 (L) 08/13/2018    Lab Results  Component Value Date   WBC 11.0 (H) 08/13/2018   NEUTROABS 7.9 (H) 08/13/2018   HGB 6.7 (L) 08/13/2018   HCT 20.5 (L) 08/13/2018   MCV 109.6 (H) 08/13/2018   PLT 203 08/13/2018     STUDIES: Ct Soft Tissue Neck W Contrast  Result Date: 08/07/2018 CLINICAL DATA:  73 y/o M; follow-up of chronic lymphocytic leukemia. History of left-sided renal cell carcinoma. EXAM: CT NECK WITH CONTRAST TECHNIQUE: Multidetector CT imaging of the neck was performed using the standard protocol following the bolus administration of intravenous contrast. CONTRAST:  35mL OMNIPAQUE IOHEXOL 300 MG/ML  SOLN COMPARISON:  05/08/2018, 02/17/2017, 03/05/2016 CT of the neck. Concurrent CT of chest, abdomen, and pelvis. FINDINGS: Pharynx and larynx: Normal. No mass or swelling. Salivary glands: No inflammation, mass, or stone. Thyroid: Normal. Lymph nodes: Diffusely decreased cervical lymphadenopathy with reference nodes as follows: 1. Right supraclavicular, 6 mm short axis, previously 7 mm, series 14, image 67. 2. Left level 2B, 5 mm short axis, previously 9 mm, series 14,  image 43. 3. Left level 3, 5 mm short axis, previously 9 mm, series 14, image 52. Vascular: Mixed plaque of the left carotid bifurcation with moderate proximal ICA stenosis. Limited intracranial: Negative. Visualized orbits: Negative. Mastoids and visualized paranasal sinuses: Clear. Skeleton: Stable cervical spondylosis with advanced discogenic degenerative changes at the C3-C7 level or uncovertebral and facet hypertrophy contributes to bony foraminal stenosis bilaterally. No high-grade bony spinal canal stenosis. Upper chest: Please refer to concurrent CT of chest for evaluation of axilla, lungs, mediastinum. Other: None. IMPRESSION: Decreased cervical lymphadenopathy. Please refer to concurrent CT of chest for evaluation axillary and mediastinal adenopathy. Electronically Signed   By: Kristine Garbe M.D.   On: 08/07/2018 14:25   Ct Chest W Contrast  Result Date: 08/07/2018 CLINICAL DATA:  Chronic lymphocytic leukemia. LEFT-sided renal cell carcinoma. EXAM: CT CHEST, ABDOMEN, AND PELVIS WITH CONTRAST TECHNIQUE: Multidetector CT imaging of the chest, abdomen and pelvis was performed following the standard protocol during bolus administration of intravenous contrast. CONTRAST:  49mL OMNIPAQUE IOHEXOL 300 MG/ML  SOLN COMPARISON:  05/08/2018 FINDINGS: CT CHEST FINDINGS Cardiovascular: Port in the anterior chest wall with tip in distal SVC. Mediastinum/Nodes: Interval decrease in size of axial lymph nodes. For example 10 mm short axis lymph node in the RIGHT axilla decreased from 23 mm. Similar findings LEFT axilla. No pathologically enlarged mediastinal nodes. Hilar lymph nodes are decreased in size from prior. These are no longer pathologically enlarged where previously measured 2 cm. No pericardial effusion. Lungs/Pleura: No suspicious pulmonary nodules. Musculoskeletal: No aggressive osseous lesion. CT ABDOMEN AND PELVIS FINDINGS Hepatobiliary: No focal hepatic lesion.  Gallbladder normal. Pancreas: Pancreas is normal. No ductal dilatation. No pancreatic inflammation. Spleen: Normal spleen volume. Adrenals/urinary tract: Adrenal glands are normal. Low-density lesion extending from the lower pole of the LEFT kidney measuring 11 mm (image 37/2 can not be fully characterize but is unchanged in size from comparison exam. The ureters and bladder normal. Stomach/Bowel: Stomach, small bowel, appendix, and cecum are normal. The colon and rectosigmoid colon are normal. Vascular/Lymphatic: Abdominal aorta normal caliber. Interval reduction in size of LEFT periaortic lymph nodes. For example 10 mm short axis lymph node on image 77/2 decreased from 30 mm. Significant decrease in iliac adenopathy. The largest remaining lymph node is a RIGHT external iliac lymph node measuring 20 mm short axis decreased  from 46 mm. Reproductive: Prostate normal Other: No free fluid. Musculoskeletal: No aggressive osseous lesion. IMPRESSION: Chest Impression: 1. Interval decrease in size of axillary and hilar lymphadenopathy. Resolution of mediastinal adenopathy. 2. No evidence of disease progression. 3. Lungs are clear. Abdomen / Pelvis Impression: 1. Interval marked decrease in retroperitoneal and iliac adenopathy. Enlarged RIGHT external iliac lymph nodes remain (but decreased as above). 2. Normal volume spleen. 3. No evidence disease progression. 4. Stable small indeterminate lesion LEFT kidney. Electronically Signed   By: Suzy Bouchard M.D.   On: 08/07/2018 15:51   Ct Abdomen Pelvis W Contrast  Result Date: 08/07/2018 CLINICAL DATA:  Chronic lymphocytic leukemia. LEFT-sided renal cell carcinoma. EXAM: CT CHEST, ABDOMEN, AND PELVIS WITH CONTRAST TECHNIQUE: Multidetector CT imaging of the chest, abdomen and pelvis was performed following the standard protocol during bolus administration of intravenous contrast. CONTRAST:  68mL OMNIPAQUE IOHEXOL 300 MG/ML  SOLN COMPARISON:  05/08/2018 FINDINGS: CT CHEST FINDINGS Cardiovascular: Port in the anterior chest wall with tip in distal SVC. Mediastinum/Nodes: Interval decrease in size of axial lymph nodes. For example 10 mm short axis lymph node in the RIGHT axilla decreased from  23 mm. Similar findings LEFT axilla. No pathologically enlarged mediastinal nodes. Hilar lymph nodes are decreased in size from prior. These are no longer pathologically enlarged where previously measured 2 cm. No pericardial effusion. Lungs/Pleura: No suspicious pulmonary nodules. Musculoskeletal: No aggressive osseous lesion. CT ABDOMEN AND PELVIS FINDINGS Hepatobiliary: No focal hepatic lesion.  Gallbladder normal. Pancreas: Pancreas is normal. No ductal dilatation. No pancreatic inflammation. Spleen: Normal spleen volume. Adrenals/urinary tract: Adrenal glands are normal. Low-density lesion extending  from the lower pole of the LEFT kidney measuring 11 mm (image 37/2 can not be fully characterize but is unchanged in size from comparison exam. The ureters and bladder normal. Stomach/Bowel: Stomach, small bowel, appendix, and cecum are normal. The colon and rectosigmoid colon are normal. Vascular/Lymphatic: Abdominal aorta normal caliber. Interval reduction in size of LEFT periaortic lymph nodes. For example 10 mm short axis lymph node on image 77/2 decreased from 30 mm. Significant decrease in iliac adenopathy. The largest remaining lymph node is a RIGHT external iliac lymph node measuring 20 mm short axis decreased from 46 mm. Reproductive: Prostate normal Other: No free fluid. Musculoskeletal: No aggressive osseous lesion. IMPRESSION: Chest Impression: 1. Interval decrease in size of axillary and hilar lymphadenopathy. Resolution of mediastinal adenopathy. 2. No evidence of disease progression. 3. Lungs are clear. Abdomen / Pelvis Impression: 1. Interval marked decrease in retroperitoneal and iliac adenopathy. Enlarged RIGHT external iliac lymph nodes remain (but decreased as above). 2. Normal volume spleen. 3. No evidence disease progression. 4. Stable small indeterminate lesion LEFT kidney. Electronically Signed   By: Suzy Bouchard M.D.   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   PLAN:    1.  CLL: Flow cytometry confirmed the diagnosis. Patient is also ZAP-70 positive this which indicates it may be a more aggressive form of CLL although FISH panel revealed 13q- which tends to have the best prognosis.  CT scan results from August 07, 2018 reviewed independently and reported as above with significant improvement of his known lymphadenopathy.  Initial plan was to proceed with cycle 4 of Rituxan and Treanda, but given his persistent anemia, will discontinue treatment at this time and proceed with 2 units packed red  blood cells.  Return to clinic in 2 weeks with repeat laboratory work and further evaluation.   2.  Anxiety: Continue Xanax PRN. 3.  Renal cell carcinoma: Patient underwent partial left nephrectomy on Nov 14, 2015.  CT scan results as above without any evidence of recurrent or progressive disease.  He was noted to have a stable small indeterminate lesion on his left kidney.  Continue follow-up with urology as needed.   4.  Anemia: Patient's hemoglobin has significantly decreased and he is symptomatic.  Return to clinic tomorrow for 2 units packed red blood cells. 5.  Renal insufficiency: Patient's creatinine has trended up slightly to 2.07.  Consider nephrology referral. 6.  Leukocytosis: Improving with treatment, monitor.  Patient expressed understanding and was in agreement with this plan. He also understands that He can call clinic at any time with any questions, concerns, or complaints.    Lloyd Huger, MD   08/21/2018 1:09 PM

## 2018-08-22 ENCOUNTER — Inpatient Hospital Stay
Admission: EM | Admit: 2018-08-22 | Discharge: 2018-09-13 | DRG: 808 | Disposition: E | Payer: Medicare Other | Attending: Pulmonary Disease | Admitting: Pulmonary Disease

## 2018-08-22 ENCOUNTER — Inpatient Hospital Stay: Payer: Medicare Other

## 2018-08-22 ENCOUNTER — Other Ambulatory Visit: Payer: Self-pay

## 2018-08-22 DIAGNOSIS — D599 Acquired hemolytic anemia, unspecified: Secondary | ICD-10-CM

## 2018-08-22 DIAGNOSIS — I82531 Chronic embolism and thrombosis of right popliteal vein: Secondary | ICD-10-CM | POA: Diagnosis present

## 2018-08-22 DIAGNOSIS — G9341 Metabolic encephalopathy: Secondary | ICD-10-CM | POA: Diagnosis present

## 2018-08-22 DIAGNOSIS — E860 Dehydration: Secondary | ICD-10-CM | POA: Diagnosis present

## 2018-08-22 DIAGNOSIS — D62 Acute posthemorrhagic anemia: Secondary | ICD-10-CM | POA: Diagnosis present

## 2018-08-22 DIAGNOSIS — K219 Gastro-esophageal reflux disease without esophagitis: Secondary | ICD-10-CM | POA: Diagnosis present

## 2018-08-22 DIAGNOSIS — Z681 Body mass index (BMI) 19 or less, adult: Secondary | ICD-10-CM | POA: Diagnosis not present

## 2018-08-22 DIAGNOSIS — I82511 Chronic embolism and thrombosis of right femoral vein: Secondary | ICD-10-CM | POA: Diagnosis present

## 2018-08-22 DIAGNOSIS — J449 Chronic obstructive pulmonary disease, unspecified: Secondary | ICD-10-CM | POA: Diagnosis not present

## 2018-08-22 DIAGNOSIS — R531 Weakness: Secondary | ICD-10-CM

## 2018-08-22 DIAGNOSIS — R42 Dizziness and giddiness: Secondary | ICD-10-CM | POA: Diagnosis not present

## 2018-08-22 DIAGNOSIS — Z66 Do not resuscitate: Secondary | ICD-10-CM | POA: Diagnosis not present

## 2018-08-22 DIAGNOSIS — F410 Panic disorder [episodic paroxysmal anxiety] without agoraphobia: Secondary | ICD-10-CM | POA: Diagnosis not present

## 2018-08-22 DIAGNOSIS — D649 Anemia, unspecified: Secondary | ICD-10-CM

## 2018-08-22 DIAGNOSIS — I469 Cardiac arrest, cause unspecified: Secondary | ICD-10-CM | POA: Diagnosis not present

## 2018-08-22 DIAGNOSIS — Z515 Encounter for palliative care: Secondary | ICD-10-CM | POA: Diagnosis not present

## 2018-08-22 DIAGNOSIS — J9600 Acute respiratory failure, unspecified whether with hypoxia or hypercapnia: Secondary | ICD-10-CM

## 2018-08-22 DIAGNOSIS — R001 Bradycardia, unspecified: Secondary | ICD-10-CM | POA: Diagnosis not present

## 2018-08-22 DIAGNOSIS — N170 Acute kidney failure with tubular necrosis: Secondary | ICD-10-CM | POA: Diagnosis present

## 2018-08-22 DIAGNOSIS — C9110 Chronic lymphocytic leukemia of B-cell type not having achieved remission: Secondary | ICD-10-CM | POA: Diagnosis not present

## 2018-08-22 DIAGNOSIS — N183 Chronic kidney disease, stage 3 (moderate): Secondary | ICD-10-CM | POA: Diagnosis present

## 2018-08-22 DIAGNOSIS — Z79899 Other long term (current) drug therapy: Secondary | ICD-10-CM

## 2018-08-22 DIAGNOSIS — K922 Gastrointestinal hemorrhage, unspecified: Secondary | ICD-10-CM | POA: Diagnosis present

## 2018-08-22 DIAGNOSIS — I82551 Chronic embolism and thrombosis of right peroneal vein: Secondary | ICD-10-CM | POA: Diagnosis present

## 2018-08-22 DIAGNOSIS — Z905 Acquired absence of kidney: Secondary | ICD-10-CM

## 2018-08-22 DIAGNOSIS — E872 Acidosis: Secondary | ICD-10-CM | POA: Diagnosis present

## 2018-08-22 DIAGNOSIS — R609 Edema, unspecified: Secondary | ICD-10-CM

## 2018-08-22 DIAGNOSIS — E43 Unspecified severe protein-calorie malnutrition: Secondary | ICD-10-CM | POA: Diagnosis not present

## 2018-08-22 DIAGNOSIS — D589 Hereditary hemolytic anemia, unspecified: Secondary | ICD-10-CM | POA: Diagnosis present

## 2018-08-22 DIAGNOSIS — N184 Chronic kidney disease, stage 4 (severe): Secondary | ICD-10-CM | POA: Diagnosis not present

## 2018-08-22 DIAGNOSIS — Z01818 Encounter for other preprocedural examination: Secondary | ICD-10-CM

## 2018-08-22 DIAGNOSIS — F17210 Nicotine dependence, cigarettes, uncomplicated: Secondary | ICD-10-CM | POA: Diagnosis present

## 2018-08-22 DIAGNOSIS — I129 Hypertensive chronic kidney disease with stage 1 through stage 4 chronic kidney disease, or unspecified chronic kidney disease: Secondary | ICD-10-CM | POA: Diagnosis present

## 2018-08-22 DIAGNOSIS — R55 Syncope and collapse: Secondary | ICD-10-CM | POA: Diagnosis not present

## 2018-08-22 DIAGNOSIS — Z886 Allergy status to analgesic agent status: Secondary | ICD-10-CM

## 2018-08-22 DIAGNOSIS — Z85528 Personal history of other malignant neoplasm of kidney: Secondary | ICD-10-CM

## 2018-08-22 DIAGNOSIS — I82411 Acute embolism and thrombosis of right femoral vein: Secondary | ICD-10-CM | POA: Diagnosis not present

## 2018-08-22 DIAGNOSIS — Z7951 Long term (current) use of inhaled steroids: Secondary | ICD-10-CM

## 2018-08-22 DIAGNOSIS — D630 Anemia in neoplastic disease: Secondary | ICD-10-CM | POA: Diagnosis present

## 2018-08-22 DIAGNOSIS — I82541 Chronic embolism and thrombosis of right tibial vein: Secondary | ICD-10-CM | POA: Diagnosis present

## 2018-08-22 DIAGNOSIS — F419 Anxiety disorder, unspecified: Secondary | ICD-10-CM | POA: Diagnosis present

## 2018-08-22 DIAGNOSIS — R739 Hyperglycemia, unspecified: Secondary | ICD-10-CM | POA: Diagnosis present

## 2018-08-22 DIAGNOSIS — Z888 Allergy status to other drugs, medicaments and biological substances status: Secondary | ICD-10-CM

## 2018-08-22 DIAGNOSIS — R000 Tachycardia, unspecified: Secondary | ICD-10-CM | POA: Diagnosis not present

## 2018-08-22 LAB — URINALYSIS, COMPLETE (UACMP) WITH MICROSCOPIC
Bilirubin Urine: NEGATIVE
Glucose, UA: NEGATIVE mg/dL
Ketones, ur: NEGATIVE mg/dL
Nitrite: NEGATIVE
Protein, ur: NEGATIVE mg/dL
Specific Gravity, Urine: 1.011 (ref 1.005–1.030)
pH: 5 (ref 5.0–8.0)

## 2018-08-22 LAB — COMPREHENSIVE METABOLIC PANEL
ALT: 17 U/L (ref 0–44)
ANION GAP: 11 (ref 5–15)
AST: 38 U/L (ref 15–41)
Albumin: 4.5 g/dL (ref 3.5–5.0)
Alkaline Phosphatase: 73 U/L (ref 38–126)
BUN: 51 mg/dL — ABNORMAL HIGH (ref 8–23)
CO2: 12 mmol/L — ABNORMAL LOW (ref 22–32)
Calcium: 9.5 mg/dL (ref 8.9–10.3)
Chloride: 119 mmol/L — ABNORMAL HIGH (ref 98–111)
Creatinine, Ser: 2.31 mg/dL — ABNORMAL HIGH (ref 0.61–1.24)
GFR calc non Af Amer: 27 mL/min — ABNORMAL LOW (ref >60)
GFR, EST AFRICAN AMERICAN: 32 mL/min — AB (ref >60)
Glucose, Bld: 115 mg/dL — ABNORMAL HIGH (ref 70–99)
Potassium: 5.5 mmol/L — ABNORMAL HIGH (ref 3.5–5.1)
Sodium: 142 mmol/L (ref 135–145)
Total Bilirubin: 3.6 mg/dL — ABNORMAL HIGH (ref 0.3–1.2)
Total Protein: 7.6 g/dL (ref 6.5–8.1)

## 2018-08-22 LAB — BASIC METABOLIC PANEL
ANION GAP: 8 (ref 5–15)
BUN: 50 mg/dL — ABNORMAL HIGH (ref 8–23)
CO2: 14 mmol/L — ABNORMAL LOW (ref 22–32)
Calcium: 9 mg/dL (ref 8.9–10.3)
Chloride: 120 mmol/L — ABNORMAL HIGH (ref 98–111)
Creatinine, Ser: 2.34 mg/dL — ABNORMAL HIGH (ref 0.61–1.24)
GFR calc Af Amer: 31 mL/min — ABNORMAL LOW (ref >60)
GFR calc non Af Amer: 27 mL/min — ABNORMAL LOW (ref >60)
Glucose, Bld: 110 mg/dL — ABNORMAL HIGH (ref 70–99)
POTASSIUM: 5.2 mmol/L — AB (ref 3.5–5.1)
Sodium: 142 mmol/L (ref 135–145)

## 2018-08-22 LAB — CBC
HCT: 19.7 % — ABNORMAL LOW (ref 39.0–52.0)
HEMATOCRIT: 9.1 % — AB (ref 39.0–52.0)
HEMOGLOBIN: 3.1 g/dL — AB (ref 13.0–17.0)
Hemoglobin: 7.1 g/dL — ABNORMAL LOW (ref 13.0–17.0)
MCH: 34.1 pg — ABNORMAL HIGH (ref 26.0–34.0)
MCH: 36.6 pg — AB (ref 26.0–34.0)
MCHC: 34.1 g/dL (ref 30.0–36.0)
MCHC: 36 g/dL (ref 30.0–36.0)
MCV: 100 fL (ref 80.0–100.0)
MCV: 101.5 fL — AB (ref 80.0–100.0)
Platelets: 296 10*3/uL (ref 150–400)
Platelets: 303 10*3/uL (ref 150–400)
RBC: 0.91 MIL/uL — ABNORMAL LOW (ref 4.22–5.81)
RBC: 1.94 MIL/uL — ABNORMAL LOW (ref 4.22–5.81)
RDW: 17.8 % — ABNORMAL HIGH (ref 11.5–15.5)
RDW: 19.7 % — ABNORMAL HIGH (ref 11.5–15.5)
WBC: 23.6 10*3/uL — ABNORMAL HIGH (ref 4.0–10.5)
WBC: 20.2 10*3/uL — ABNORMAL HIGH (ref 4.0–10.5)
nRBC: 0.5 % — ABNORMAL HIGH (ref 0.0–0.2)
nRBC: 0.4 % — ABNORMAL HIGH (ref 0.0–0.2)

## 2018-08-22 LAB — PHOSPHORUS: Phosphorus: 3.8 mg/dL (ref 2.5–4.6)

## 2018-08-22 LAB — PREPARE RBC (CROSSMATCH)

## 2018-08-22 LAB — LACTIC ACID, PLASMA: LACTIC ACID, VENOUS: 2.8 mmol/L — AB (ref 0.5–1.9)

## 2018-08-22 LAB — MRSA PCR SCREENING: MRSA by PCR: NEGATIVE

## 2018-08-22 LAB — GLUCOSE, CAPILLARY: GLUCOSE-CAPILLARY: 92 mg/dL (ref 70–99)

## 2018-08-22 LAB — MAGNESIUM: Magnesium: 2.6 mg/dL — ABNORMAL HIGH (ref 1.7–2.4)

## 2018-08-22 LAB — TROPONIN I: Troponin I: 0.06 ng/mL (ref <0.03)

## 2018-08-22 LAB — PROCALCITONIN: Procalcitonin: 1.61 ng/mL

## 2018-08-22 MED ORDER — ACETAMINOPHEN 325 MG PO TABS: 650.0000 mg | ORAL_TABLET | Freq: Four times a day (QID) | ORAL | Status: DC | PRN

## 2018-08-22 MED ORDER — SODIUM CHLORIDE 0.9 % IV BOLUS
1000.0000 mL | Freq: Once | INTRAVENOUS | Status: AC
  Administered 2018-08-22: 1000 mL via INTRAVENOUS

## 2018-08-22 MED ORDER — MEGESTROL ACETATE 20 MG PO TABS
40.0000 mg | ORAL_TABLET | Freq: Every day | ORAL | Status: DC
  Administered 2018-08-23: 40 mg via ORAL
  Filled 2018-08-22: qty 2

## 2018-08-22 MED ORDER — SODIUM CHLORIDE 0.9 % IV SOLN
10.0000 mL/h | Freq: Once | INTRAVENOUS | Status: AC
  Administered 2018-08-23: 10 mL/h via INTRAVENOUS

## 2018-08-22 MED ORDER — ONDANSETRON HCL 4 MG/2ML IJ SOLN: 4.0000 mg | Freq: Four times a day (QID) | INTRAMUSCULAR | Status: DC | PRN

## 2018-08-22 MED ORDER — LEVOCETIRIZINE DIHYDROCHLORIDE 5 MG PO TABS: 5.0000 mg | ORAL_TABLET | Freq: Every evening | ORAL | Status: DC

## 2018-08-22 MED ORDER — DIPHENHYDRAMINE HCL 50 MG/ML IJ SOLN: 25.0000 mg | Freq: Four times a day (QID) | INTRAMUSCULAR | Status: DC | PRN

## 2018-08-22 MED ORDER — SODIUM CHLORIDE 0.9% IV SOLUTION
Freq: Once | INTRAVENOUS | Status: AC
  Administered 2018-08-23: 02:00:00 via INTRAVENOUS

## 2018-08-22 MED ORDER — PIPERACILLIN-TAZOBACTAM 3.375 G IVPB
3.3750 g | Freq: Three times a day (TID) | INTRAVENOUS | Status: DC
  Administered 2018-08-23 – 2018-08-24 (×5): 3.375 g via INTRAVENOUS
  Filled 2018-08-22 (×5): qty 50

## 2018-08-22 MED ORDER — BISACODYL 5 MG PO TBEC: 5.0000 mg | DELAYED_RELEASE_TABLET | Freq: Every day | ORAL | Status: DC | PRN

## 2018-08-22 MED ORDER — SODIUM CHLORIDE 0.9 % IV SOLN: 250.0000 mL | INTRAVENOUS | Status: DC | PRN

## 2018-08-22 MED ORDER — MOMETASONE FURO-FORMOTEROL FUM 200-5 MCG/ACT IN AERO
2.0000 | INHALATION_SPRAY | Freq: Two times a day (BID) | RESPIRATORY_TRACT | Status: DC
  Administered 2018-08-23 – 2018-08-24 (×3): 2 via RESPIRATORY_TRACT
  Filled 2018-08-22: qty 8.8

## 2018-08-22 MED ORDER — PANTOPRAZOLE SODIUM 40 MG PO TBEC
40.0000 mg | DELAYED_RELEASE_TABLET | Freq: Every day | ORAL | Status: DC
  Administered 2018-08-23 – 2018-08-24 (×2): 40 mg via ORAL
  Filled 2018-08-22 (×2): qty 1

## 2018-08-22 MED ORDER — HYDROCODONE-ACETAMINOPHEN 5-325 MG PO TABS
1.0000 | ORAL_TABLET | ORAL | Status: DC | PRN
  Administered 2018-08-23: 1 via ORAL
  Filled 2018-08-22: qty 1

## 2018-08-22 MED ORDER — SODIUM CHLORIDE 0.9% FLUSH: 3.0000 mL | INTRAVENOUS | Status: DC | PRN

## 2018-08-22 MED ORDER — ALBUTEROL SULFATE (2.5 MG/3ML) 0.083% IN NEBU
2.5000 mg | INHALATION_SOLUTION | RESPIRATORY_TRACT | Status: DC | PRN
  Administered 2018-08-23: 2.5 mg via RESPIRATORY_TRACT
  Filled 2018-08-22 (×2): qty 3

## 2018-08-22 MED ORDER — ACETAMINOPHEN 650 MG RE SUPP: 650.0000 mg | Freq: Four times a day (QID) | RECTAL | Status: DC | PRN

## 2018-08-22 MED ORDER — IPRATROPIUM BROMIDE 0.03 % NA SOLN
2.0000 | Freq: Two times a day (BID) | NASAL | Status: DC
  Administered 2018-08-23: 2 via NASAL
  Filled 2018-08-22: qty 30

## 2018-08-22 MED ORDER — SODIUM CHLORIDE 0.9 % IV SOLN: 25.0000 mg | INTRAVENOUS | Status: DC | PRN

## 2018-08-22 MED ORDER — ONDANSETRON HCL 4 MG PO TABS: 4.0000 mg | ORAL_TABLET | Freq: Four times a day (QID) | ORAL | Status: DC | PRN

## 2018-08-22 MED ORDER — SENNOSIDES-DOCUSATE SODIUM 8.6-50 MG PO TABS: 1.0000 | ORAL_TABLET | Freq: Every evening | ORAL | Status: DC | PRN

## 2018-08-22 MED ORDER — SODIUM CHLORIDE 0.9% FLUSH
3.0000 mL | Freq: Two times a day (BID) | INTRAVENOUS | Status: DC
  Administered 2018-08-22 – 2018-08-24 (×4): 3 mL via INTRAVENOUS

## 2018-08-22 MED ORDER — UMECLIDINIUM-VILANTEROL 62.5-25 MCG/INH IN AEPB
1.0000 | INHALATION_SPRAY | Freq: Every day | RESPIRATORY_TRACT | Status: DC
  Administered 2018-08-23 – 2018-08-24 (×2): 1 via RESPIRATORY_TRACT
  Filled 2018-08-22: qty 14

## 2018-08-22 NOTE — ED Notes (Signed)
Sent 3 tall pink tubes with green,purple,and red top tubes per lab request.

## 2018-08-22 NOTE — ED Notes (Signed)
Dr Bridgett Larsson responded to page. He acknowledged description of symptoms and felt that it was not a reaction. Received verbal orders to restart transfusion. 25mg  Benadryl ordered PRN. Will continue to monitor patient.

## 2018-08-22 NOTE — ED Notes (Signed)
Sending t&s,blue,green,purple,and gray top tubes have been sent to the lab.

## 2018-08-22 NOTE — ED Provider Notes (Signed)
[facility] Emergency Department Provider Note ___________________________________________   I have reviewed the triage vital signs and the nursing notes.   HISTORY  Chief Complaint Dizziness and Weakness   History limited by: Not Limited   HPI Julian Medina is a 73 y.o. male who presents to the emergency department today because of concerns for some dizziness and weakness.  The symptoms apparently became worse today.  It sounds like he might of had some weakness recently and states that he has required blood transfusions through the cancer center.  Today however the symptoms were significantly worse.  He was unable to get up out of bed.  He denies any chest pain with this.  Denies any palpitations.  Denies any headache.    Per medical record review patient has a history of CLL, chronic anemia  Past Medical History:  Diagnosis Date  . Anxiety   . CLL (chronic lymphocytic leukemia) ([place])   . COPD (chronic obstructive pulmonary disease) ([place])   . Hypertension     Patient Active Problem List   Diagnosis Date Noted  . Protein-calorie malnutrition ([place]) 06/04/2018  . Advanced care planning/counseling discussion 06/04/2018  . GERD (gastroesophageal reflux disease) 10/22/2017  . Senile purpura ([place]) 12/18/2016  . Anxiety 10/26/2015  . Essential hypertension 05/04/2015  . COPD (chronic obstructive pulmonary disease) ([place]) 05/04/2015  . Chronic lymphocytic leukemia ([place]) 05/04/2015  . Vertigo 05/04/2015  . BPH (benign prostatic hyperplasia) 05/04/2015    Past Surgical History:  Procedure Laterality Date  . HERNIA REPAIR    . PERIPHERAL VASCULAR CATHETERIZATION N/A 03/14/2016   Procedure: Glori Luis Cath Insertion;  Surgeon: Algernon Huxley, MD;  Location: [place] [facility];  Service: Cardiovascular;  Laterality: N/A;  . ROBOTIC ASSITED PARTIAL NEPHRECTOMY Left 11/14/2015   Procedure: ROBOTIC ASSITED PARTIAL NEPHRECTOMY with intraoperative drop in ultrasound /  Extensive Lysis of adhesions;  Surgeon: [name], MD;  Location: [facility];  Service: Urology;  Laterality: Left;    Prior to Admission medications   Medication Sig Start Date End Date Taking? Authorizing Provider  amLODipine (NORVASC) 10 MG tablet Take 1 tablet (10 mg total) by mouth daily. 06/04/18   Guadalupe Maple, MD  benazepril (LOTENSIN) 40 MG tablet Take 1 tablet (40 mg total) by mouth daily. 06/04/18   Guadalupe Maple, MD  Fluticasone-Salmeterol (ADVAIR DISKUS) 250-50 MCG/DOSE AEPB Inhale 1 puff into the lungs 2 (two) times daily. 06/04/18   Guadalupe Maple, MD  ipratropium (ATROVENT) 0.03 % nasal spray Place 2 sprays into both nostrils every 12 (twelve) hours. 07/07/18   Volney American, PA-C  levocetirizine (XYZAL) 5 MG tablet Take 1 tablet (5 mg total) by mouth every evening. 07/07/18   Volney American, PA-C  lidocaine-prilocaine (EMLA) cream Apply 1 application topically as needed. 07/30/17   Lloyd Huger, MD  LORazepam (ATIVAN) 1 MG tablet Take 1 tablet (1 mg total) by mouth daily as needed for anxiety. 11/13/16   Lloyd Huger, MD  megestrol (MEGACE) 40 MG tablet TAKE 1 TABLET BY MOUTH EVERY DAY 07/27/18   Lloyd Huger, MD  pantoprazole (PROTONIX) 40 MG tablet Take 1 tablet (40 mg total) by mouth daily. 06/04/18   Guadalupe Maple, MD  PROAIR HFA 108 (90 Base) MCG/ACT inhaler TAKE 2 PUFFS BY MOUTH EVERY 6 HOURS AS NEEDED FOR WHEEZE OR SHORTNESS OF BREATH 03/17/18   Crissman, Jeannette How, MD  umeclidinium-vilanterol (ANORO ELLIPTA) 62.5-25 MCG/INH AEPB Inhale 1 puff into the lungs daily.  07/14/18   Park Liter P, DO    Allergies Aspirin and Hydrochlorothiazide  Family History  Problem Relation Age of Onset  . Hearing loss Brother     Social History Social History   Tobacco Use  . Smoking status: Current Every Day Smoker    Packs/day: 1.00    Years: 40.00    Pack years: 40.00    Types: Cigarettes  . Smokeless tobacco: Never Used   Substance Use Topics  . Alcohol use: Yes    Alcohol/week: 7.0 standard drinks    Types: 7 Cans of beer per week  . Drug use: No    Review of Systems Constitutional: No fever/chills. Positive for generalized weakness.  Eyes: No visual changes. ENT: No sore throat. Cardiovascular: Denies chest pain. Respiratory: Denies shortness of breath. Gastrointestinal: No abdominal pain.  No nausea, no vomiting.  No diarrhea.   Genitourinary: Negative for dysuria. Musculoskeletal: Negative for back pain. Skin: Negative for rash. Neurological: Positive for dizziness  ____________________________________________   PHYSICAL EXAM:  VITAL SIGNS: ED Triage Vitals  Enc Vitals Group     BP 08/16/2018 1259 100/73     Pulse Rate 09/02/2018 1259 (!) 109     Resp 08/20/2018 1259 (!) 23     Temp 08/19/2018 1253 98 F (36.7 C)     Temp Source 08/17/2018 1253 Oral     SpO2 09/11/2018 1259 100 %     Weight 08/28/2018 1253 136 lb (61.7 kg)     Height 09/07/2018 1253 6\' 2"  (1.88 m)     Head Circumference --      Peak Flow --      Pain Score 08/27/2018 1253 0   Constitutional: Alert and oriented.  Eyes: Conjunctivae are normal.  ENT      Head: Normocephalic and atraumatic.      Nose: No congestion/rhinnorhea.      Mouth/Throat: Mucous membranes are moist.      Neck: No stridor. Hematological/Lymphatic/Immunilogical: No cervical lymphadenopathy. Cardiovascular: Tachycardic, regular rhythm.  No murmurs, rubs, or gallops.  Respiratory: Normal respiratory effort without tachypnea nor retractions. Breath sounds are clear and equal bilaterally. No wheezes/rales/rhonchi. Gastrointestinal: Soft and non tender. No rebound. No guarding.  Genitourinary: Deferred Musculoskeletal: Normal range of motion in all extremities. No lower extremity edema. Neurologic:  Normal speech and language. No gross focal neurologic deficits are appreciated.  Skin:  Skin is warm, dry and intact. No rash noted. Psychiatric: Mood and affect  are normal. Speech and behavior are normal. Patient exhibits appropriate insight and judgment.  ____________________________________________    LABS (pertinent positives/negatives)  CBC wbc 23.6, hgb 3.1, plt 296 BMP na 142, k 5.2, glu 110, cr 2.34  ____________________________________________   EKG  I, Nance Pear, attending physician, personally viewed and interpreted this EKG  EKG Time: 1257 Rate: 109 Rhythm: sinus tachycardia Axis: normal Intervals: qtc 458 QRS: narrow ST changes: no st elevation Impression: abnormal ekg   ____________________________________________    RADIOLOGY  None  ____________________________________________   PROCEDURES  Procedures  CRITICAL CARE Performed by: Nance Pear   Total critical care time: 40 minutes  Critical care time was exclusive of separately billable procedures and treating other patients.  Critical care was necessary to treat or prevent imminent or life-threatening deterioration.  Critical care was time spent personally by me on the following activities: development of treatment plan with patient and/or surrogate as well as nursing, discussions with consultants, evaluation of patient's response to treatment, examination of patient, obtaining history from patient  or surrogate, ordering and performing treatments and interventions, ordering and review of laboratory studies, ordering and review of radiographic studies, pulse oximetry and re-evaluation of patient's condition.   ____________________________________________   INITIAL IMPRESSION / ASSESSMENT AND PLAN / ED COURSE  Pertinent labs & imaging results that were available during my care of the patient were reviewed by me and considered in my medical decision making (see chart for details).   Patient presented to the emergency department today because of concerns for weakness and dizziness.  Patient does have a history of CLL and is required  transfusions in the past.  Patient was slightly tachycardic here in the emergency department.  Concern for worsening anemia versus infection versus dehydration.  Patient's blood work did show significant anemia.  I do think this could explain the patient's symptoms and tachycardia.  Patient was consented for blood.  Patient will be given blood and admitted to the hospital.   ____________________________________________   FINAL CLINICAL IMPRESSION(S) / ED DIAGNOSES  Final diagnoses:  Generalized weakness  Anemia, unspecified type     Note: This dictation was prepared with Dragon dictation. Any transcriptional errors that result from this process are unintentional     Nance Pear, MD 08/28/2018 [phone]

## 2018-08-22 NOTE — Progress Notes (Signed)
Advanced Care Plan.  Purpose of Encounter: CODE STATUS and palliative care. Parties in Attendance: The patient, his wife, RN and me. Patient's Decisional Capacity: No. Medical Story: Julian Medina  is a 73 y.o. male with a known history of CLL, COPD, hypertension and anxiety.  The patient has had leukemia for 10 years with multiple blood transfusion due to anemia.  The patient is being admitted for severe anemia with hemoglobin 3.1, tachycardia, acute metabolic encephalopathy and dehydration.  I discussed with the patient's wife about his current condition, poor prognosis, CODE STATUS and palliative care.  His wife said the patient wants to be resuscitated and intubated if he has cardiopulmonary arrest.  She agreed to get a palliative care consult to discuss about palliative care. Goals of Care Determinations: Palliative care. Plan:  Code Status: Full code Time spent discussing advance care planning: 18 minutes.

## 2018-08-22 NOTE — H&P (Signed)
Julian Medina at [place] NAME: Julian Medina    MR#:  [id number]  DATE OF BIRTH:  [birth year]  DATE OF ADMISSION:  08/18/2018  PRIMARY CARE PHYSICIAN: Guadalupe Maple, MD   REQUESTING/REFERRING PHYSICIAN: Dr. Archie Balboa.  CHIEF COMPLAINT:   Chief Complaint  Patient presents with  . Dizziness  . Weakness   Dizziness and generalized weakness today. HISTORY OF PRESENT ILLNESS:  Julian Medina  is a 73 y.o. male with a known history of CLL, COPD, hypertension and anxiety.  The patient has had leukemia for 10 years with multiple blood transfusion due to anemia.  The last blood transfusion was 1 week ago per his wife.  The patient sleeps soundly, is unable to be waken up.  Per his wife, the patient feels dizzy and generalized weakness today.  But he did not fell or passed out.  He is found hemoglobin 3.1 in the ED.  Dr. Archie Balboa ordered PRBC transfusion and request admission. PAST MEDICAL HISTORY:   Past Medical History:  Diagnosis Date  . Anxiety   . CLL (chronic lymphocytic leukemia) ([place])   . COPD (chronic obstructive pulmonary disease) ([place])   . Hypertension     PAST SURGICAL HISTORY:   Past Surgical History:  Procedure Laterality Date  . HERNIA REPAIR    . PERIPHERAL VASCULAR CATHETERIZATION N/A 03/14/2016   Procedure: Glori Luis Cath Insertion;  Surgeon: Algernon Huxley, MD;  Location: [facility];  Service: Cardiovascular;  Laterality: N/A;  . ROBOTIC ASSITED PARTIAL NEPHRECTOMY Left 11/14/2015   Procedure: ROBOTIC ASSITED PARTIAL NEPHRECTOMY with intraoperative drop in ultrasound / Extensive Lysis of adhesions;  Surgeon: [name], MD;  Location: [facility];  Service: Urology;  Laterality: Left;    SOCIAL HISTORY:   Social History   Tobacco Use  . Smoking status: Current Every Day Smoker    Packs/day: 1.00    Years: 40.00    Pack years: 40.00    Types: Cigarettes  . Smokeless tobacco: Never Used  Substance Use Topics  . Alcohol  use: Yes    Alcohol/week: 7.0 standard drinks    Types: 7 Cans of beer per week    FAMILY HISTORY:   Family History  Problem Relation Age of Onset  . Hearing loss Brother     DRUG ALLERGIES:   Allergies  Allergen Reactions  . Aspirin Other (See Comments)    Ulcer   . Hydrochlorothiazide Itching    REVIEW OF SYSTEMS:   Review of Systems  Unable to perform ROS: Medical condition    MEDICATIONS AT HOME:   Prior to Admission medications   Medication Sig Start Date End Date Taking? Authorizing Provider  amLODipine (NORVASC) 10 MG tablet Take 1 tablet (10 mg total) by mouth daily. 06/04/18   Guadalupe Maple, MD  benazepril (LOTENSIN) 40 MG tablet Take 1 tablet (40 mg total) by mouth daily. 06/04/18   Guadalupe Maple, MD  Fluticasone-Salmeterol (ADVAIR DISKUS) 250-50 MCG/DOSE AEPB Inhale 1 puff into the lungs 2 (two) times daily. 06/04/18   Guadalupe Maple, MD  ipratropium (ATROVENT) 0.03 % nasal spray Place 2 sprays into both nostrils every 12 (twelve) hours. 07/07/18   Volney American, PA-C  levocetirizine (XYZAL) 5 MG tablet Take 1 tablet (5 mg total) by mouth every evening. 07/07/18   Volney American, PA-C  lidocaine-prilocaine (EMLA) cream Apply 1 application topically as needed. 07/30/17   Lloyd Huger, MD  LORazepam (ATIVAN) 1 MG  tablet Take 1 tablet (1 mg total) by mouth daily as needed for anxiety. 11/13/16   Lloyd Huger, MD  megestrol (MEGACE) 40 MG tablet TAKE 1 TABLET BY MOUTH EVERY DAY 07/27/18   Lloyd Huger, MD  pantoprazole (PROTONIX) 40 MG tablet Take 1 tablet (40 mg total) by mouth daily. 06/04/18   Guadalupe Maple, MD  PROAIR HFA 108 (90 Base) MCG/ACT inhaler TAKE 2 PUFFS BY MOUTH EVERY 6 HOURS AS NEEDED FOR WHEEZE OR SHORTNESS OF BREATH 03/17/18   Crissman, Jeannette How, MD  umeclidinium-vilanterol (ANORO ELLIPTA) 62.5-25 MCG/INH AEPB Inhale 1 puff into the lungs daily. 07/14/18   Johnson, Megan P, DO      VITAL SIGNS:  Blood  pressure (!) 107/55, pulse (!) 114, temperature (!) 97.5 F (36.4 C), temperature source Axillary, resp. rate (!) 27, height 6\' 2"  (1.88 m), weight 61.7 kg, SpO2 100 %.  PHYSICAL EXAMINATION:  Physical Exam  GENERAL:  73 y.o.-year-old patient lying in the bed with no acute distress.  Severe malnutrition. EYES: Pupils equal, round, reactive to light and accommodation. No scleral icterus. Extraocular muscles intact.  HEENT: Head atraumatic, normocephalic.  NECK:  Supple, no jugular venous distention. No thyroid enlargement, no tenderness.  LUNGS: Normal breath sounds bilaterally, no wheezing, rales,rhonchi or crepitation. No use of accessory muscles of respiration.  CARDIOVASCULAR: S1, S2 normal. No murmurs, rubs, or gallops.  ABDOMEN: Soft, nontender, nondistended. Bowel sounds present. No organomegaly or mass.  EXTREMITIES: No pedal edema, cyanosis, or clubbing.  NEUROLOGIC: Unable to exam. PSYCHIATRIC: The patient sleeps soundly, unable to exam. SKIN: No obvious rash, lesion, or ulcer.  LABORATORY PANEL:   CBC Recent Labs  Lab 08/28/2018 1540  WBC 23.6*  HGB 3.1*  HCT 9.1*  PLT 296   ------------------------------------------------------------------------------------------------------------------  Chemistries  Recent Labs  Lab 08/21/2018 1348  NA 142  K 5.2*  CL 120*  CO2 14*  GLUCOSE 110*  BUN 50*  CREATININE 2.34*  CALCIUM 9.0   ------------------------------------------------------------------------------------------------------------------  Cardiac Enzymes No results for input(s): TROPONINI in the last 168 hours. ------------------------------------------------------------------------------------------------------------------  RADIOLOGY:  No results found.    IMPRESSION AND PLAN:   Symptomatic anemia due to leukemia. The patient will be admitted to stepdown unit. 2 unit PRBC transfusion, follow-up hemoglobin and hematology consult.  Dehydration and CKD  stage III.  IV fluid support and follow-up BMP. Tachycardia due to above.  Normal saline bolus, telemetry monitor. Acute metabolic encephalopathy due to above. Aspiration precaution and fall precaution.  CT of the head.  Severe malnutrition.  Dietitian consult. Hypertension.  Hold hypertension medication.  Poor prognosis, palliative care consult. I discussed with intensivist. All the records are reviewed and case discussed with ED provider. Management plans discussed with the patient, his wife and they are in agreement.  CODE STATUS: Full code  TOTAL TIME TAKING CARE OF THIS PATIENT: 35 minutes.    Demetrios Loll M.D on 09/11/2018 at 5:45 PM  Between 7am to 6pm - Pager - [phone]  After 6pm go to [email or web address] - Proofreader  [facility]  Office  [phone]  CC: Primary care physician; Guadalupe Maple, MD   Note: This dictation was prepared with Dragon dictation along with smaller phrase technology. Any transcriptional errors that result from this process are unin

## 2018-08-22 NOTE — ED Notes (Signed)
Accessed implanted port in R upper chest using sterile technique. Pt tolerated procedure well. Port flushed with 28mL NS

## 2018-08-22 NOTE — ED Notes (Signed)
Responded to call bell. Pts family reported that he had pushed off his covers and called out that he "wants to get off this medicine" followed by tremors in both legs. Entire episode lasted approx 15 seconds. Blood stopped, Dr Bridgett Larsson paged. Vital signs remaining stable.

## 2018-08-22 NOTE — ED Notes (Signed)
Date and time results received: 09/03/2018 1650   Test: Hgb/Hct Critical Value: HGT 3.1, HCT 9.1  Name of Provider Notified: Dr Archie Balboa

## 2018-08-22 NOTE — Progress Notes (Signed)
Pharmacy Antibiotic Note  Julian Medina is a 73 y.o. male admitted on 08/21/2018 with intra-abdominal infection.  Pharmacy has been consulted for Zosyn dosing.  Plan: Zosyn 3.375g IV q8h (4 hour infusion).  Height: 5\' 11"  (180.3 cm) Weight: 128 lb 15.5 oz (58.5 kg) IBW/kg (Calculated) : 75.3  Temp (24hrs), Avg:98 F (36.7 C), Min:97.5 F (36.4 C), Max:98.7 F (37.1 C)  Recent Labs  Lab 09/10/2018 1348 09/03/2018 1540 08/20/2018 2111  WBC  --  23.6* 20.2*  CREATININE 2.34*  --  2.31*  LATICACIDVEN  --   --  2.8*    Estimated Creatinine Clearance: 24.3 mL/min (A) (by C-G formula based on SCr of 2.31 mg/dL (H)).    Allergies  Allergen Reactions  . Aspirin Other (See Comments)    Ulcer   . Hydrochlorothiazide Itching    Antimicrobials this admission: Zosyn 2/9  >>    >>   Dose adjustments this admission:   Microbiology results: 2/8 MRSA PCR: (-)      2/8 UA: LE(tr)  NO2(-)  WBC 11-20 Thank you for allowing pharmacy to be a part of this patient's care.  Venus Gilles S 08/27/2018 11:30 PM

## 2018-08-22 NOTE — Consult Note (Signed)
PULMONARY / CRITICAL CARE MEDICINE  Name: Julian Medina MRN: [id number] DOB: 1945/11/27    LOS: 0  Referring Provider: Dr. Bridgett Larsson  Reason for Referral:  Acute blood loss anemia and dehydration Brief patient description:  73 y/o AA male with leukemia admitted with severe acute blood loss anemia.  HPI: This is a 73 year old African-American male with a medical history as indicated below who presented to the ED with dizziness and weakness.  He was found to be severely anemic with his hemoglobin of 3.1.  He was transfused 2 units of packed red blood cells in the ED and admitted to the ICU for further management.  He is alert and offers no complaints.  He has had multiple blood transfusions the past.  He requires special crossmatching prior to transfusion.  Past Medical History:  Diagnosis Date  . Anxiety   . CLL (chronic lymphocytic leukemia) ([place])   . COPD (chronic obstructive pulmonary disease) ([place])   . Hypertension    Past Surgical History:  Procedure Laterality Date  . HERNIA REPAIR    . PERIPHERAL VASCULAR CATHETERIZATION N/A 03/14/2016   Procedure: Glori Luis Cath Insertion;  Surgeon: Algernon Huxley, MD;  Location: [place] [facility];  Service: Cardiovascular;  Laterality: N/A;  . ROBOTIC ASSITED PARTIAL NEPHRECTOMY Left 11/14/2015   Procedure: ROBOTIC ASSITED PARTIAL NEPHRECTOMY with intraoperative drop in ultrasound / Extensive Lysis of adhesions;  Surgeon: [name], MD;  Location: [facility];  Service: Urology;  Laterality: Left;   Prior to Admission medications   Medication Sig Start Date End Date Taking? Authorizing Provider  amLODipine (NORVASC) 5 MG tablet Take 5 mg by mouth daily.   Yes [provider]  clopidogrel (PLAVIX) 75 MG tablet Take 75 mg by mouth daily.   Yes [provider]  donepezil (ARICEPT) 5 MG tablet Take 1 tablet (5 mg total) by mouth at bedtime. 03/09/18 04/18/18 Yes Sowles, Drue Stager, MD  empagliflozin (JARDIANCE) 25 MG TABS tablet Take 25 mg by  mouth daily.   Yes [provider]  glycopyrrolate (ROBINUL) 1 MG tablet Take 1 mg by mouth 2 (two) times daily.   Yes [provider]  insulin aspart (NOVOLOG FLEXPEN) 100 UNIT/ML FlexPen Inject 12 Units into the skin 2 (two) times daily.   Yes [provider]  insulin aspart (NOVOLOG) 100 UNIT/ML FlexPen Inject 18 Units into the skin daily. At 1700   Yes [provider]  Insulin Degludec-Liraglutide (XULTOPHY) 100-3.6 UNIT-MG/ML SOPN Inject 50 Units into the skin daily.   Yes [provider]  levETIRAcetam (KEPPRA) 500 MG tablet Take 500 mg by mouth 2 (two) times daily.   Yes [provider]  lipase/protease/amylase (CREON) 12000 units CPEP capsule Take 6,000 Units by mouth 3 (three) times daily before meals.   Yes [provider]  lipase/protease/amylase (CREON) 12000 units CPEP capsule Take 3,000 Units by mouth at bedtime. With snack   Yes [provider]  lisinopril (PRINIVIL,ZESTRIL) 5 MG tablet Take 5 mg by mouth daily.   Yes [provider]  metoprolol succinate (TOPROL-XL) 25 MG 24 hr tablet Take 1 tablet (25 mg total) by mouth daily. 03/09/18  Yes Sowles, Drue Stager, MD  rosuvastatin (CRESTOR) 40 MG tablet Take 1 tablet (40 mg total) by mouth daily. 03/09/18 04/18/18 Yes Steele Sizer, MD  aspirin EC 81 MG tablet Take 81 mg by mouth daily.    [provider]  famotidine (PEPCID) 20 MG tablet Take 1 tablet (20 mg total) by mouth 2 (two)  times daily. 03/09/18 04/08/18  Steele Sizer, MD  gabapentin (NEURONTIN) 300 MG capsule Take 1 capsule (300 mg total) by mouth 2 (two) times daily. 03/09/18 04/08/18  Steele Sizer, MD  insulin glargine (LANTUS) 100 UNIT/ML injection Inject 0.1 mLs (10 Units total) into the skin daily. 03/09/18 04/08/18  Steele Sizer, MD  lacosamide 100 MG TABS Take 1 tablet (100 mg total) by mouth 2 (two) times daily. Patient not taking: Reported on 04/18/2018 07/25/17   Fritzi Mandes, MD   promethazine (PHENERGAN) 12.5 MG tablet Take 1 tablet (12.5 mg total) by mouth every 6 (six) hours as needed for nausea or vomiting. Patient not taking: Reported on 04/18/2018 05/13/17   Stark Klein, MD  sertraline (ZOLOFT) 25 MG tablet Take 1 tablet (25 mg total) by mouth daily. Patient not taking: Reported on 04/18/2018 03/09/18   Steele Sizer, MD   Allergies Allergies  Allergen Reactions  . Aspirin Other (See Comments)    Ulcer   . Hydrochlorothiazide Itching    Family History Family History  Problem Relation Age of Onset  . Hearing loss Brother    Social History  reports that he has been smoking cigarettes. He has a 40.00 pack-year smoking history. He has never used smokeless tobacco. He reports current alcohol use of about 7.0 standard drinks of alcohol per week. He reports that he does not use drugs.  Review Of Systems:  Constitutional: Negative for fever and chills but positive for malaise.  HENT: Negative for congestion and rhinorrhea.  Eyes: Negative for redness and visual disturbance.  Respiratory: Negative for shortness of breath and wheezing.  Cardiovascular: Negative for chest pain and palpitations.  Gastrointestinal: Negative  for nausea , vomiting and abdominal pain and  Loose stools Genitourinary: Negative for dysuria and urgency.  Endocrine: Denies polyuria, polyphagia and heat intolerance Musculoskeletal: Negative for myalgias and arthralgias.  Skin: Negative for pallor and wound.  Neurological: Positive for dizziness and weakness  VITAL SIGNS: BP 107/69 (BP Location: Right Arm)   Pulse (!) 119   Temp 97.6 F (36.4 C) (Oral)   Resp (!) 24   Ht 5\' 11"  (1.803 m)   Wt 58.5 kg   SpO2 100%   BMI 17.99 kg/m   HEMODYNAMICS:    VENTILATOR SETTINGS:    INTAKE / OUTPUT: I/O last 3 completed shifts: In: 998.5 [IV Piggyback:998.5] Out: -   PHYSICAL EXAMINATION: General:  NAD. Chronically ill looking HEENT:  PERRLA, trachea midline, no  JVD Neuro:  AAO X2, moves all extremities, +corneals Cardiovascular:  RRR, S1/S2, no MRG, +2 pulses, no edema Lungs: BL breath sounds, diminished in the bases Abdomen:  Normal BS X4, No deforminties Musculoskeletal:  +rom, no joint swelling Skin:  Warm and dry  LABS:  BMET Recent Labs  Lab 09/06/2018 1348  NA 142  K 5.2*  CL 120*  CO2 14*  BUN 50*  CREATININE 2.34*  GLUCOSE 110*    Electrolytes Recent Labs  Lab 09/01/2018 1348  CALCIUM 9.0    CBC Recent Labs  Lab 08/28/2018 1540  WBC 23.6*  HGB 3.1*  HCT 9.1*  PLT 296    Coag's No results for input(s): APTT, INR in the last 168 hours.  Sepsis Markers No results for input(s): LATICACIDVEN, PROCALCITON, O2SATVEN in the last 168 hours.  ABG No results for input(s): PHART, PCO2ART, PO2ART in the last 168 hours.  Liver Enzymes No results for input(s): AST, ALT, ALKPHOS, BILITOT, ALBUMIN in the last 168 hours.  Cardiac Enzymes No results for  input(s): TROPONINI, PROBNP in the last 168 hours.  Glucose Recent Labs  Lab 08/23/2018 2010  GLUCAP 92    Imaging No results found.  STUDIES:  none  CULTURES: none  ANTIBIOTICS: Zosyn for sepsis/inreabdominal infection  SIGNIFICANT EVENTS: 09/06/2018: admitted  LINES/TUBES: PIVs Right mediport  DISCUSSION: 73 year old male with chronic lymphocytic leukemia presenting with symptomatic acute blood loss anemia, acute on chronic renal failure, malnutrition and leukocytosis  ASSESSMENT  Acute blood loss anemia secondary to slow GI bleed and CLL Acute on chronic renal failure stage III Leukocytosis-rule out intra-abdominal infection COPD Hypertension Anxiety  PLAN Hemodynamic monitoring per ICU protocol IV hydration with normal saline at 125 mL's an hour Follow-up with GI Monitor for recurrent overt bleeding Trend creatinine Monitor and correct electrolyte imbalances Type and cross Transfuse for hemoglobin less than 7 Trend procalcitonin and adjust  antibiotics Follow-up with oncology as scheduled for CLL Resume all home medications  Best Practice: Code Status: Full code Diet: Heart healthy GI prophylaxis: Not indicated VTE prophylaxis: SCDs, from oncology VTE prophylaxis contraindicated  FAMILY  - Updates:   [facility] ANP-BC Pulmonary and Critical Care Medicine [facility] Pager [phone] or [phone]  NB: This document was prepared using Dragon voice recognition software and may include unintentional dictation errors.    08/23/2018, 9:04 PM

## 2018-08-22 NOTE — ED Notes (Signed)
Blood bank had 2 specially matched units for this patient on standby from a previous transfusion.

## 2018-08-22 NOTE — ED Triage Notes (Signed)
Pt arrives to ED from home via [facility] with c/c of dizziness and generalized weakness.

## 2018-08-22 NOTE — ED Notes (Signed)
Port flushes well but quit drawing after 10 cc waste and 3 cc of blood. Sent green top to lab. Attempted 2 straight draws with no success. Will call lab to send tech. Dr Archie Balboa notified.

## 2018-08-22 NOTE — ED Notes (Signed)
ED TO INPATIENT HANDOFF REPORT  Name/Age/Gender Julian Medina 73 y.o. male  Code Status   Home/SNF/Other Home  Chief Complaint Dizzy   Level of Care/Admitting Diagnosis ED Disposition    ED Disposition Condition [place]: [place] [100120]  Level of Care: Stepdown [14]  Diagnosis: Symptomatic anemia [0932355]  Admitting Physician: Demetrios Loll [[id number]]  Attending Physician: Demetrios Loll [[id number]]  Estimated length of stay: past midnight tomorrow  Certification:: I certify this patient will need inpatient services for at least 2 midnights  PT Class (Do Not Modify): Inpatient [101]  PT Acc Code (Do Not Modify): Private [1]       Medical History Past Medical History:  Diagnosis Date  . Anxiety   . CLL (chronic lymphocytic leukemia) ([place])   . COPD (chronic obstructive pulmonary disease) ([place])   . Hypertension     Allergies Allergies  Allergen Reactions  . Aspirin Other (See Comments)    Ulcer   . Hydrochlorothiazide Itching    IV Location/Drains/Wounds Patient Lines/Drains/Airways Status   Active Line/Drains/Airways    Name:   Placement date:   Placement time:   Site:   Days:   Implanted Port Right Chest   -    -    Chest      Peripheral IV 09/04/2018 Left Arm   09/02/2018    1743    Arm   less than 1   Incision (Closed) 11/14/15 Abdomen Left   11/14/15    1129     1012   Incision - 6 Ports Abdomen Mid;Upper Mid Mid;Lower Left;Mid Left;Upper Left;Lower   11/14/15    -     1012          Labs/Imaging Results for orders placed or performed during the hospital encounter of 08/23/2018 (from the past 48 hour(s))  Basic metabolic panel     Status: Abnormal   Collection Time: 09/07/2018  1:48 PM  Result Value Ref Range   Sodium 142 135 - 145 mmol/L   Potassium 5.2 (H) 3.5 - 5.1 mmol/L   Chloride 120 (H) 98 - 111 mmol/L   CO2 14 (L) 22 - 32 mmol/L   Glucose, Bld 110 (H) 70 - 99 mg/dL   BUN 50 (H) 8 - 23 mg/dL   Creatinine,  Ser 2.34 (H) 0.61 - 1.24 mg/dL   Calcium 9.0 8.9 - 10.3 mg/dL   GFR calc non Af Amer 27 (L) >60 mL/min   GFR calc Af Amer 31 (L) >60 mL/min   Anion gap 8 5 - 15    Comment: Performed at [facility], [address] [place], [place] [address]  CBC     Status: Abnormal   Collection Time: 08/17/2018  3:40 PM  Result Value Ref Range   WBC 23.6 (H) 4.0 - 10.5 K/uL   RBC 0.91 (L) 4.22 - 5.81 MIL/uL   Hemoglobin 3.1 (LL) 13.0 - 17.0 g/dL    Comment: This critical result has verified and been called to TOM Maurice Ramseur by Dagoberto Reef Hendricks on 02 08 2020 at 1650, and has been read back.    HCT 9.1 (LL) 39.0 - 52.0 %    Comment: This critical result has verified and been called to TOM Munirah Doerner by Dagoberto Reef Hendricks on 02 08 2020 at 1650, and has been read back.    MCV 100.0 80.0 - 100.0 fL   MCH 34.1 (H) 26.0 - 34.0 pg   MCHC 34.1  30.0 - 36.0 g/dL    Comment: CORRECTED FOR ICTERUS   RDW 17.8 (H) 11.5 - 15.5 %   Platelets 296 150 - 400 K/uL   nRBC 0.5 (H) 0.0 - 0.2 %    Comment: Performed at [facility], [address] [place], [place] [address]  Type and screen [place]     Status: None (Preliminary result)   Collection Time: 08/20/2018  3:40 PM  Result Value Ref Range   ABO/RH(D) O NEG    Antibody Screen POS    Sample Expiration 08/25/2018    Antibody Identification PENDING    Antibody ID,T [place] PENDING    DAT, IgG POS    DAT, complement POS    Unit Number [id number]    Blood Component Type RBC, LR IRR    Unit division 00    Status of Unit ISSUED    Transfusion Status OK TO TRANSFUSE    Crossmatch Result INCOMPATIBLE    Unit tag comment      GIVE PHENOTYPICALLY MATCHED BLOOD IRRADIATED PRODUCT EMERGENCY RELEASE   Unit Number [id number]    Blood Component Type RBC, LR IRR    Unit division 00    Status of Unit ISSUED    Transfusion Status OK TO TRANSFUSE    Crossmatch Result INCOMPATIBLE    Unit tag comment      GIVE PHENOTYPICALLY  MATCHED BLOOD IRRADIATED PRODUCT EMERGENCY RELEASE Performed at [facility], [address] [place], [place] [address]   Prepare RBC     Status: None   Collection Time: 08/18/2018  5:21 PM  Result Value Ref Range   Order Confirmation      ORDER PROCESSED BY BLOOD BANK Performed at [facility], [address] [place],  [address]    No results found.  Pending Labs Unresulted Labs (From admission, onward)    Start     Ordered   09/04/2018 1256  Urinalysis, Complete w Microscopic  ONCE - STAT,   STAT     09/11/2018 1255   Signed and Held  Basic metabolic panel  Tomorrow morning,   R     Signed and Held   Signed and Held  CBC  Tomorrow morning,   R     Signed and Held          Vitals/Pain Today's Vitals   09/05/2018 1758 08/17/2018 1819 08/17/2018 1900 08/15/2018 1930  BP:  113/61 117/67 121/66  Pulse: (!) 115 (!) 107 (!) 110 (!) 111  Resp: (!) 24 (!) 28 (!) 21 18  Temp: 98.2 F (36.8 C)   98.7 F (37.1 C)  TempSrc: Axillary   Axillary  SpO2: 100% 100% 100% 98%  Weight:      Height:      PainSc:  0-No pain      Isolation Precautions No active isolations  Medications Medications  0.9 %  sodium chloride infusion (has no administration in time range)  sodium chloride 0.9 % bolus 1,000 mL (0 mLs Intravenous Stopped 09/02/2018 1621)    Mobility walks

## 2018-08-22 NOTE — ED Notes (Signed)
Pt stated need to urinate.but had already wet his trousers before we were able to get them down to use a urinal. Pt cleaned, peri care provided, Pt placed in clean diaper. Linens changed. Pt resting comfortably.

## 2018-08-23 ENCOUNTER — Other Ambulatory Visit: Payer: Self-pay

## 2018-08-23 DIAGNOSIS — R531 Weakness: Secondary | ICD-10-CM

## 2018-08-23 DIAGNOSIS — D599 Acquired hemolytic anemia, unspecified: Principal | ICD-10-CM

## 2018-08-23 DIAGNOSIS — C9110 Chronic lymphocytic leukemia of B-cell type not having achieved remission: Secondary | ICD-10-CM

## 2018-08-23 LAB — CBC
HCT: 23.3 % — ABNORMAL LOW (ref 39.0–52.0)
HCT: 21.6 % — ABNORMAL LOW (ref 39.0–52.0)
Hemoglobin: 8.4 g/dL — ABNORMAL LOW (ref 13.0–17.0)
Hemoglobin: 8.2 g/dL — ABNORMAL LOW (ref 13.0–17.0)
MCH: 34.1 pg — ABNORMAL HIGH (ref 26.0–34.0)
MCH: 39.4 pg — AB (ref 26.0–34.0)
MCHC: 36.1 g/dL — ABNORMAL HIGH (ref 30.0–36.0)
MCHC: 38 g/dL — ABNORMAL HIGH (ref 30.0–36.0)
MCV: 94.7 fL (ref 80.0–100.0)
MCV: 103.8 fL — ABNORMAL HIGH (ref 80.0–100.0)
Platelets: 243 10*3/uL (ref 150–400)
Platelets: 248 10*3/uL (ref 150–400)
RBC: 2.46 MIL/uL — ABNORMAL LOW (ref 4.22–5.81)
RBC: 2.08 MIL/uL — ABNORMAL LOW (ref 4.22–5.81)
RDW: 18.1 % — ABNORMAL HIGH (ref 11.5–15.5)
WBC: 18.6 10*3/uL — ABNORMAL HIGH (ref 4.0–10.5)
WBC: 18.6 10*3/uL — ABNORMAL HIGH (ref 4.0–10.5)
nRBC: 0.7 % — ABNORMAL HIGH (ref 0.0–0.2)
nRBC: 0.9 % — ABNORMAL HIGH (ref 0.0–0.2)

## 2018-08-23 LAB — BASIC METABOLIC PANEL
Anion gap: 8 (ref 5–15)
BUN: 50 mg/dL — ABNORMAL HIGH (ref 8–23)
CO2: 13 mmol/L — ABNORMAL LOW (ref 22–32)
Calcium: 8.8 mg/dL — ABNORMAL LOW (ref 8.9–10.3)
Chloride: 120 mmol/L — ABNORMAL HIGH (ref 98–111)
Creatinine, Ser: 2.18 mg/dL — ABNORMAL HIGH (ref 0.61–1.24)
GFR calc Af Amer: 34 mL/min — ABNORMAL LOW (ref >60)
GFR, EST NON AFRICAN AMERICAN: 29 mL/min — AB (ref >60)
Glucose, Bld: 190 mg/dL — ABNORMAL HIGH (ref 70–99)
Potassium: 4.6 mmol/L (ref 3.5–5.1)
SODIUM: 141 mmol/L (ref 135–145)

## 2018-08-23 LAB — DIFFERENTIAL
Abs Immature Granulocytes: 0.29 10*3/uL — ABNORMAL HIGH (ref 0.00–0.07)
Basophils Absolute: 0.1 10*3/uL (ref 0.0–0.1)
Basophils Relative: 0 %
Eosinophils Absolute: 0.1 10*3/uL (ref 0.0–0.5)
Eosinophils Relative: 1 %
Immature Granulocytes: 2 %
Lymphocytes Relative: 14 %
Lymphs Abs: 2.6 10*3/uL (ref 0.7–4.0)
Monocytes Absolute: 0.7 10*3/uL (ref 0.1–1.0)
Monocytes Relative: 4 %
Neutro Abs: 14.8 10*3/uL — ABNORMAL HIGH (ref 1.7–7.7)
Neutrophils Relative %: 79 %

## 2018-08-23 LAB — RETIC PANEL
Immature Retic Fract: 22.6 % — ABNORMAL HIGH (ref 2.3–15.9)
RBC.: 2.03 MIL/uL — ABNORMAL LOW (ref 4.22–5.81)
Retic Count, Absolute: 132.4 10*3/uL (ref 19.0–186.0)
Retic Ct Pct: 6.5 % — ABNORMAL HIGH (ref 0.4–3.1)
Reticulocyte Hemoglobin: 40.7 pg (ref >27.9)

## 2018-08-23 LAB — MAGNESIUM: Magnesium: 2.3 mg/dL (ref 1.7–2.4)

## 2018-08-23 LAB — FOLATE: Folate: 11.3 ng/mL (ref >5.9)

## 2018-08-23 LAB — PREPARE RBC (CROSSMATCH)

## 2018-08-23 LAB — BILIRUBIN, FRACTIONATED(TOT/DIR/INDIR)
Bilirubin, Direct: 0.6 mg/dL — ABNORMAL HIGH (ref 0.0–0.2)
Indirect Bilirubin: 3.3 mg/dL — ABNORMAL HIGH (ref 0.3–0.9)
Total Bilirubin: 3.9 mg/dL — ABNORMAL HIGH (ref 0.3–1.2)

## 2018-08-23 LAB — PHOSPHORUS: Phosphorus: 2.8 mg/dL (ref 2.5–4.6)

## 2018-08-23 LAB — PROTIME-INR
INR: 1.04
Prothrombin Time: 13.5 seconds (ref 11.4–15.2)

## 2018-08-23 LAB — LACTATE DEHYDROGENASE: LDH: 528 U/L — ABNORMAL HIGH (ref 98–192)

## 2018-08-23 LAB — TECHNOLOGIST SMEAR REVIEW

## 2018-08-23 MED ORDER — METHYLPREDNISOLONE SODIUM SUCC 125 MG IJ SOLR
60.0000 mg | Freq: Once | INTRAMUSCULAR | Status: AC
  Administered 2018-08-23: 60 mg via INTRAVENOUS
  Filled 2018-08-23: qty 2

## 2018-08-23 MED ORDER — ENSURE ENLIVE PO LIQD
237.0000 mL | Freq: Three times a day (TID) | ORAL | Status: DC
  Administered 2018-08-23 (×2): 237 mL via ORAL

## 2018-08-23 MED ORDER — SODIUM CHLORIDE 0.9% FLUSH: 10.0000 mL | INTRAVENOUS | Status: DC | PRN

## 2018-08-23 MED ORDER — DRONABINOL 2.5 MG PO CAPS
2.5000 mg | ORAL_CAPSULE | Freq: Two times a day (BID) | ORAL | Status: DC
  Administered 2018-08-23 – 2018-08-24 (×2): 2.5 mg via ORAL
  Filled 2018-08-23 (×2): qty 1

## 2018-08-23 NOTE — Consult Note (Addendum)
Hematology/Oncology Consult note [facility] [phone] [phone]  Patient Care Team: Guadalupe Maple, MD as PCP - General (Family Medicine) Lloyd Huger, MD as Consulting Physician (Oncology) [name], MD as Consulting Physician (Urology)   Name of the patient: Julian Medina  [id number]  March 05, 1946   Date of visit: 08/23/18 REASON FOR COSULTATION:   History of presenting illness-  73 y.o. male with PMH listed at below who presents to ER for evaluation of generalized weakness and fatigue. Was found to have hemoglobin 3.1 in the emergency room.  Oncology was consulted for further evaluation. Reviewed patient's past medical history. Patient has history of CLL, follows up with Dr. Grayland Ormond, has been on bendamustine and rituximab regimen for CLL treatment. He was last seen by Dr. Grayland Ormond on 08/13/2018 for evaluation prior to cycle 4 bendamustine and rituximab.  Cycle 4 chemotherapy was held as patient had persistent anemia with hemoglobin of 6.7, MCV 109.6,.  Patient was transfused with 2 units of PRBC.  Patient was admitted to ICU due to significantly low hemoglobin.  Status post multiple units of PRBC transfusion. Today patient reports feeling better.  Son and grandson were at bedside.  Review of Systems  Constitutional: Positive for fatigue. Negative for appetite change, chills, fever and unexpected weight change.  HENT:   Negative for hearing loss and voice change.   Eyes: Negative for eye problems and icterus.  Respiratory: Positive for shortness of breath. Negative for chest tightness.   Cardiovascular: Negative for chest pain and leg swelling.  Gastrointestinal: Negative for abdominal distention and abdominal pain.  Endocrine: Negative for hot flashes.  Genitourinary: Negative for difficulty urinating, dysuria and frequency.   Musculoskeletal: Negative for arthralgias.  Skin: Negative for itching and rash.  Neurological:  Negative for light-headedness and numbness.  Hematological: Negative for adenopathy. Does not bruise/bleed easily.  Psychiatric/Behavioral: Negative for confusion.    Allergies  Allergen Reactions  . Aspirin Other (See Comments)    Ulcer   . Hydrochlorothiazide Itching    Patient Active Problem List   Diagnosis Date Noted  . Symptomatic anemia 09/07/2018  . Protein-calorie malnutrition ([place]) 06/04/2018  . Advanced care planning/counseling discussion 06/04/2018  . GERD (gastroesophageal reflux disease) 10/22/2017  . Senile purpura ([place]) 12/18/2016  . Anxiety 10/26/2015  . Essential hypertension 05/04/2015  . COPD (chronic obstructive pulmonary disease) ([place]) 05/04/2015  . Chronic lymphocytic leukemia ([place]) 05/04/2015  . Vertigo 05/04/2015  . BPH (benign prostatic hyperplasia) 05/04/2015     Past Medical History:  Diagnosis Date  . Anxiety   . CLL (chronic lymphocytic leukemia) ([place])   . COPD (chronic obstructive pulmonary disease) ([place])   . Hypertension      Past Surgical History:  Procedure Laterality Date  . HERNIA REPAIR    . PERIPHERAL VASCULAR CATHETERIZATION N/A 03/14/2016   Procedure: Glori Luis Cath Insertion;  Surgeon: Algernon Huxley, MD;  Location: [place] [facility];  Service: Cardiovascular;  Laterality: N/A;  . ROBOTIC ASSITED PARTIAL NEPHRECTOMY Left 11/14/2015   Procedure: ROBOTIC ASSITED PARTIAL NEPHRECTOMY with intraoperative drop in ultrasound / Extensive Lysis of adhesions;  Surgeon: [name], MD;  Location: [facility];  Service: Urology;  Laterality: Left;    Social History   Socioeconomic History  . Marital status: Married    Spouse name: Not on file  . Number of children: Not on file  . Years of education: Not on file  . Highest education level: 9th grade  Occupational History  . Not on  file  Social Needs  . Financial resource strain: Not hard at all  . Food insecurity:    Worry: Never true    Inability: Never true  . Transportation needs:     Medical: No    Non-medical: No  Tobacco Use  . Smoking status: Current Every Day Smoker    Packs/day: 1.00    Years: 40.00    Pack years: 40.00    Types: Cigarettes  . Smokeless tobacco: Never Used  Substance and Sexual Activity  . Alcohol use: Yes    Alcohol/week: 7.0 standard drinks    Types: 7 Cans of beer per week  . Drug use: No  . Sexual activity: Not on file  Lifestyle  . Physical activity:    Days per week: 0 days    Minutes per session: 0 min  . Stress: Not at all  Relationships  . Social connections:    Talks on phone: More than three times a week    Gets together: More than three times a week    Attends religious service: More than 4 times per year    Active member of club or organization: No    Attends meetings of clubs or organizations: Never    Relationship status: Married  . Intimate partner violence:    Fear of current or ex partner: No    Emotionally abused: No    Physically abused: No    Forced sexual activity: No  Other Topics Concern  . Not on file  Social History Narrative   Works 32 hours a week      Family History  Problem Relation Age of Onset  . Hearing loss Brother      Current Facility-Administered Medications:  .  0.9 %  sodium chloride infusion, 250 mL, Intravenous, PRN, Demetrios Loll, MD .  acetaminophen (TYLENOL) tablet 650 mg, 650 mg, Oral, Q6H PRN **OR** acetaminophen (TYLENOL) suppository 650 mg, 650 mg, Rectal, Q6H PRN, Demetrios Loll, MD .  albuterol (PROVENTIL) (2.5 MG/3ML) 0.083% nebulizer solution 2.5 mg, 2.5 mg, Nebulization, Q2H PRN, Demetrios Loll, MD .  bisacodyl (DULCOLAX) EC tablet 5 mg, 5 mg, Oral, Daily PRN, Demetrios Loll, MD .  diphenhydrAMINE (BENADRYL) injection 25 mg, 25 mg, Intravenous, QID PRN, Demetrios Loll, MD .  dronabinol (MARINOL) capsule 2.5 mg, 2.5 mg, Oral, BID AC, Aleskerov, Fuad, MD .  feeding supplement (ENSURE ENLIVE) (ENSURE ENLIVE) liquid 237 mL, 237 mL, Oral, TID BM, Mody, Sital, MD .   HYDROcodone-acetaminophen (NORCO/VICODIN) 5-325 MG per tablet 1 tablet, 1 tablet, Oral, Q4H PRN, Demetrios Loll, MD .  ipratropium (ATROVENT) 0.03 % nasal spray 2 spray, 2 spray, Each Nare, Q12H, Demetrios Loll, MD .  mometasone-formoterol [facility]) 200-5 MCG/ACT inhaler 2 puff, 2 puff, Inhalation, BID, Demetrios Loll, MD, 2 puff at 08/23/18 0750 .  ondansetron (ZOFRAN) tablet 4 mg, 4 mg, Oral, Q6H PRN **OR** ondansetron (ZOFRAN) injection 4 mg, 4 mg, Intravenous, Q6H PRN, Demetrios Loll, MD .  pantoprazole (PROTONIX) EC tablet 40 mg, 40 mg, Oral, Daily, Demetrios Loll, MD, 40 mg at 08/23/18 1003 .  piperacillin-tazobactam (ZOSYN) IVPB 3.375 g, 3.375 g, Intravenous, Q8H, Tukov-Yual, Magdalene S, NP, Stopped at 08/23/18 0954 .  senna-docusate (Senokot-S) tablet 1 tablet, 1 tablet, Oral, QHS PRN, Demetrios Loll, MD .  sodium chloride flush (NS) 0.9 % injection 10-40 mL, 10-40 mL, Intracatheter, PRN, Mody, Sital, MD .  sodium chloride flush (NS) 0.9 % injection 3 mL, 3 mL, Intravenous, Q12H, Demetrios Loll, MD, 3 mL at 08/23/18  1003 .  sodium chloride flush (NS) 0.9 % injection 3 mL, 3 mL, Intravenous, PRN, Demetrios Loll, MD .  umeclidinium-vilanterol [facility] ELLIPTA) 62.5-25 MCG/INH 1 puff, 1 puff, Inhalation, Daily, Demetrios Loll, MD, 1 puff at 08/23/18 1003  Facility-Administered Medications Ordered in Other Encounters:  .  sodium chloride flush (NS) 0.9 % injection 10 mL, 10 mL, Intravenous, PRN, Lloyd Huger, MD, 10 mL at 01/26/18 1515   Physical exam:  Vitals:   08/23/18 0800 08/23/18 0900 08/23/18 1000 08/23/18 1100  BP:  125/78    Pulse: (!) 105 100  98  Resp: (!) 21 19 (!) 25 (!) 25  Temp: 97.9 F (36.6 C)     TempSrc: Oral     SpO2: 100% 100%  99%  Weight:      Height:       Physical Exam  Constitutional: He is oriented to person, place, and time. No distress.  HENT:  Head: Normocephalic and atraumatic.  Mouth/Throat: No oropharyngeal exudate.  Eyes: Pupils are equal, round, and reactive to light. EOM  are normal. No scleral icterus.  Neck: Normal range of motion. Neck supple.  Cardiovascular: Normal rate and regular rhythm.  No murmur heard. Pulmonary/Chest: Effort normal. No respiratory distress. He has no rales. He exhibits no tenderness.  Abdominal: Soft. He exhibits no distension. There is no abdominal tenderness.  Musculoskeletal: Normal range of motion.        General: No edema.  Neurological: He is alert and oriented to person, place, and time.  Skin: Skin is warm and dry. He is not diaphoretic. No erythema. There is pallor.  Psychiatric: Affect normal.        CMP Latest Ref Rng & Units 08/23/2018  Glucose 70 - 99 mg/dL 190(H)  BUN 8 - 23 mg/dL 50(H)  Creatinine 0.61 - 1.24 mg/dL 2.18(H)  Sodium 135 - 145 mmol/L 141  Potassium 3.5 - 5.1 mmol/L 4.6  Chloride 98 - 111 mmol/L 120(H)  CO2 22 - 32 mmol/L 13(L)  Calcium 8.9 - 10.3 mg/dL 8.8(L)  Total Protein 6.5 - 8.1 g/dL -  Total Bilirubin 0.3 - 1.2 mg/dL -  Alkaline Phos 38 - 126 U/L -  AST 15 - 41 U/L -  ALT 0 - 44 U/L -   CBC Latest Ref Rng & Units 08/23/2018  WBC 4.0 - 10.5 K/uL 18.6(H)  Hemoglobin 13.0 - 17.0 g/dL 8.4(L)  Hematocrit 39.0 - 52.0 % 23.3(L)  Platelets 150 - 400 K/uL 243   RADIOGRAPHIC STUDIES: I have personally reviewed the radiological images as listed and agreed with the findings in the report. Ct Head Wo Contrast  Result Date: 08/23/2018 CLINICAL DATA:  73 y/o  M; dizziness and weakness. EXAM: CT HEAD WITHOUT CONTRAST TECHNIQUE: Contiguous axial images were obtained from the base of the skull through the vertex without intravenous contrast. COMPARISON:  08/07/2018 CT head. FINDINGS: Brain: No evidence of acute infarction, hemorrhage, hydrocephalus, extra-axial collection or mass lesion/mass effect. Small hypodensities bilaterally within subinsular white matter probably represents chronic lacunar infarctions. Few nonspecific hypodensities in white matter compatible with mild chronic microvascular  ischemic changes for age. Mild diffuse volume loss of the brain. Vascular: Calcific atherosclerosis of carotid siphons. No hyperdense vessel. Skull: Normal. Negative for fracture or focal lesion. Sinuses/Orbits: No acute finding. Other: None. IMPRESSION: 1. No acute intracranial abnormality identified. 2. Mild chronic microvascular ischemic changes and mild volume loss of the brain for age. Electronically Signed   By: Kristine Garbe M.D.   On:  08/23/2018 00:00   Ct Soft Tissue Neck W Contrast  Result Date: 08/07/2018 CLINICAL DATA:  73 y/o M; follow-up of chronic lymphocytic leukemia. History of left-sided renal cell carcinoma. EXAM: CT NECK WITH CONTRAST TECHNIQUE: Multidetector CT imaging of the neck was performed using the standard protocol following the bolus administration of intravenous contrast. CONTRAST:  61mL OMNIPAQUE IOHEXOL 300 MG/ML  SOLN COMPARISON:  05/08/2018, 02/17/2017, 03/05/2016 CT of the neck. Concurrent CT of chest, abdomen, and pelvis. FINDINGS: Pharynx and larynx: Normal. No mass or swelling. Salivary glands: No inflammation, mass, or stone. Thyroid: Normal. Lymph nodes: Diffusely decreased cervical lymphadenopathy with reference nodes as follows: 1. Right supraclavicular, 6 mm short axis, previously 7 mm, series 14, image 67. 2. Left level 2B, 5 mm short axis, previously 9 mm, series 14, image 43. 3. Left level 3, 5 mm short axis, previously 9 mm, series 14, image 52. Vascular: Mixed plaque of the left carotid bifurcation with moderate proximal ICA stenosis. Limited intracranial: Negative. Visualized orbits: Negative. Mastoids and visualized paranasal sinuses: Clear. Skeleton: Stable cervical spondylosis with advanced discogenic degenerative changes at the C3-C7 level or uncovertebral and facet hypertrophy contributes to bony foraminal stenosis bilaterally. No high-grade bony spinal canal stenosis. Upper chest: Please refer to concurrent CT of chest for evaluation of axilla,  lungs, mediastinum. Other: None. IMPRESSION: Decreased cervical lymphadenopathy. Please refer to concurrent CT of chest for evaluation axillary and mediastinal adenopathy. Electronically Signed   By: Kristine Garbe M.D.   On: 08/07/2018 14:25   Ct Chest W Contrast  Result Date: 08/07/2018 CLINICAL DATA:  Chronic lymphocytic leukemia. LEFT-sided renal cell carcinoma. EXAM: CT CHEST, ABDOMEN, AND PELVIS WITH CONTRAST TECHNIQUE: Multidetector CT imaging of the chest, abdomen and pelvis was performed following the standard protocol during bolus administration of intravenous contrast. CONTRAST:  19mL OMNIPAQUE IOHEXOL 300 MG/ML  SOLN COMPARISON:  05/08/2018 FINDINGS: CT CHEST FINDINGS Cardiovascular: Port in the anterior chest wall with tip in distal SVC. Mediastinum/Nodes: Interval decrease in size of axial lymph nodes. For example 10 mm short axis lymph node in the RIGHT axilla decreased from 23 mm. Similar findings LEFT axilla. No pathologically enlarged mediastinal nodes. Hilar lymph nodes are decreased in size from prior. These are no longer pathologically enlarged where previously measured 2 cm. No pericardial effusion. Lungs/Pleura: No suspicious pulmonary nodules. Musculoskeletal: No aggressive osseous lesion. CT ABDOMEN AND PELVIS FINDINGS Hepatobiliary: No focal hepatic lesion.  Gallbladder normal. Pancreas: Pancreas is normal. No ductal dilatation. No pancreatic inflammation. Spleen: Normal spleen volume. Adrenals/urinary tract: Adrenal glands are normal. Low-density lesion extending from the lower pole of the LEFT kidney measuring 11 mm (image 37/2 can not be fully characterize but is unchanged in size from comparison exam. The ureters and bladder normal. Stomach/Bowel: Stomach, small bowel, appendix, and cecum are normal. The colon and rectosigmoid colon are normal. Vascular/Lymphatic: Abdominal aorta normal caliber. Interval reduction in size of LEFT periaortic lymph nodes. For example 10 mm  short axis lymph node on image 77/2 decreased from 30 mm. Significant decrease in iliac adenopathy. The largest remaining lymph node is a RIGHT external iliac lymph node measuring 20 mm short axis decreased from 46 mm. Reproductive: Prostate normal Other: No free fluid. Musculoskeletal: No aggressive osseous lesion. IMPRESSION: Chest Impression: 1. Interval decrease in size of axillary and hilar lymphadenopathy. Resolution of mediastinal adenopathy. 2. No evidence of disease progression. 3. Lungs are clear. Abdomen / Pelvis Impression: 1. Interval marked decrease in retroperitoneal and iliac adenopathy. Enlarged RIGHT external iliac lymph nodes  remain (but decreased as above). 2. Normal volume spleen. 3. No evidence disease progression. 4. Stable small indeterminate lesion LEFT kidney. Electronically Signed   By: Suzy Bouchard M.D.   On: 08/07/2018 15:51   Ct Abdomen Pelvis W Contrast  Result Date: 08/07/2018 CLINICAL DATA:  Chronic lymphocytic leukemia. LEFT-sided renal cell carcinoma. EXAM: CT CHEST, ABDOMEN, AND PELVIS WITH CONTRAST TECHNIQUE: Multidetector CT imaging of the chest, abdomen and pelvis was performed following the standard protocol during bolus administration of intravenous contrast. CONTRAST:  41mL OMNIPAQUE IOHEXOL 300 MG/ML  SOLN COMPARISON:  05/08/2018 FINDINGS: CT CHEST FINDINGS Cardiovascular: Port in the anterior chest wall with tip in distal SVC. Mediastinum/Nodes: Interval decrease in size of axial lymph nodes. For example 10 mm short axis lymph node in the RIGHT axilla decreased from 23 mm. Similar findings LEFT axilla. No pathologically enlarged mediastinal nodes. Hilar lymph nodes are decreased in size from prior. These are no longer pathologically enlarged where previously measured 2 cm. No pericardial effusion. Lungs/Pleura: No suspicious pulmonary nodules. Musculoskeletal: No aggressive osseous lesion. CT ABDOMEN AND PELVIS FINDINGS Hepatobiliary: No focal hepatic lesion.   Gallbladder normal. Pancreas: Pancreas is normal. No ductal dilatation. No pancreatic inflammation. Spleen: Normal spleen volume. Adrenals/urinary tract: Adrenal glands are normal. Low-density lesion extending from the lower pole of the LEFT kidney measuring 11 mm (image 37/2 can not be fully characterize but is unchanged in size from comparison exam. The ureters and bladder normal. Stomach/Bowel: Stomach, small bowel, appendix, and cecum are normal. The colon and rectosigmoid colon are normal. Vascular/Lymphatic: Abdominal aorta normal caliber. Interval reduction in size of LEFT periaortic lymph nodes. For example 10 mm short axis lymph node on image 77/2 decreased from 30 mm. Significant decrease in iliac adenopathy. The largest remaining lymph node is a RIGHT external iliac lymph node measuring 20 mm short axis decreased from 46 mm. Reproductive: Prostate normal Other: No free fluid. Musculoskeletal: No aggressive osseous lesion. IMPRESSION: Chest Impression: 1. Interval decrease in size of axillary and hilar lymphadenopathy. Resolution of mediastinal adenopathy. 2. No evidence of disease progression. 3. Lungs are clear. Abdomen / Pelvis Impression: 1. Interval marked decrease in retroperitoneal and iliac adenopathy. Enlarged RIGHT external iliac lymph nodes remain (but decreased as above). 2. Normal volume spleen. 3. No evidence disease progression. 4. Stable small indeterminate lesion LEFT kidney. Electronically Signed   By: Suzy Bouchard M.D.   On: 08/07/2018 15:51    Assessment and plan- Patient is a 73 y.o. male with history of CLL, status post 3 cycles of BR, last chemotherapy 07/17/2018 presented to emergency room for evaluation of profound weakness.  Was found to have hemoglobin of 3.1.  Status post multiple units of blood transfusion.  #Severe anemia with hemoglobin of 3.1 Status post PRBC transfusion.  Today hemoglobin is improved to 8.3. Differential was not obtained and I added to today's  lab. I personally reviewed patient's blood smear, which showed a left shift.  Obtain official pathology smear review. Add reticulocyte panel, indirect and direct bilirubin, LDH, haptoglobin. Suspect hemolysis.  Addendum: Labs reviewed, high LDH, haptoglobin pending, increased indirect bilirubin, increased reticulocytosis.  Consistent with acute hemolytic anemia.  Check DAT, B12, Folate.  Recommend start trials of steroids.ordered  Solemedrol 60mg  x 1.   Thank you for allowing me to participate in the care of this patient.  Total face to face encounter time for this patient visit was 70 min. >50% of the time was  spent in counseling and coordination of care.  Earlie Server, MD, PhD Hematology Oncology [facility] Pager- [phone] 08/23/2018

## 2018-08-23 NOTE — Progress Notes (Signed)
[place] [facility] [place] NAME: Julian Medina    MR#:  [id number]  DATE OF BIRTH:  1945/09/08  SUBJECTIVE:   Patient denies chest pain or SOB   REVIEW OF SYSTEMS:    Review of Systems  Constitutional: Negative for fever, chills weight loss HENT: Negative for ear pain, nosebleeds, congestion, facial swelling, rhinorrhea, neck pain, neck stiffness and ear discharge.   Respiratory: Negative for cough, shortness of breath, wheezing  Cardiovascular: Negative for chest pain, palpitations and leg swelling.  Gastrointestinal: Negative for heartburn, abdominal pain, vomiting, diarrhea or consitpation Genitourinary: Negative for dysuria, urgency, frequency, hematuria Musculoskeletal: Negative for back pain or joint pain Neurological: Negative for dizziness, seizures, syncope, focal weakness,  numbness and headaches.  Hematological: Does not bruise/bleed easily.  Psychiatric/Behavioral: Negative for hallucinations, confusion, dysphoric mood    Tolerating Diet:yes      DRUG ALLERGIES:   Allergies  Allergen Reactions  . Aspirin Other (See Comments)    Ulcer   . Hydrochlorothiazide Itching    VITALS:  Blood pressure 124/74, pulse (!) 105, temperature 97.9 F (36.6 C), temperature source Oral, resp. rate (!) 21, height 5\' 11"  (1.803 m), weight 58.5 kg, SpO2 100 %.  PHYSICAL EXAMINATION:  Constitutional: Appears thinNo distress. HENT: Normocephalic. Marland Kitchen Oropharynx is clear and moist.  Eyes: Conjunctivae and EOM are normal. PERRLA, no scleral icterus.  Neck: Normal ROM. Neck supple. No JVD. No tracheal deviation. CVS: RRR, S1/S2 +, no murmurs, no gallops, no carotid bruit.  Pulmonary: Effort and breath sounds normal, no stridor, rhonchi, wheezes, rales.  Abdominal: Soft. BS +,  no distension, tenderness, rebound or guarding.  Musculoskeletal: Normal range of motion. R>L LEE and no tenderness.  Neuro: Alert. CN 2-12 grossly intact. No focal  deficits. Skin: Skin is warm and dry. No rash noted. Psychiatric: Normal mood and affect.      LABORATORY PANEL:   CBC Recent Labs  Lab 08/23/18 0821  WBC 18.6*  HGB 8.4*  HCT 23.3*  PLT 243   ------------------------------------------------------------------------------------------------------------------  Chemistries  Recent Labs  Lab 09/02/2018 2111 08/23/18 0821  NA 142 141  K 5.5* 4.6  CL 119* 120*  CO2 12* 13*  GLUCOSE 115* 190*  BUN 51* 50*  CREATININE 2.31* 2.18*  CALCIUM 9.5 8.8*  MG 2.6*  --   AST 38  --   ALT 17  --   ALKPHOS 73  --   BILITOT 3.6*  --    ------------------------------------------------------------------------------------------------------------------  Cardiac Enzymes Recent Labs  Lab 08/15/2018 2111  TROPONINI 0.06*   ------------------------------------------------------------------------------------------------------------------  RADIOLOGY:  Ct Head Wo Contrast  Result Date: 08/23/2018 CLINICAL DATA:  73 y/o  M; dizziness and weakness. EXAM: CT HEAD WITHOUT CONTRAST TECHNIQUE: Contiguous axial images were obtained from the base of the skull through the vertex without intravenous contrast. COMPARISON:  08/07/2018 CT head. FINDINGS: Brain: No evidence of acute infarction, hemorrhage, hydrocephalus, extra-axial collection or mass lesion/mass effect. Small hypodensities bilaterally within subinsular white matter probably represents chronic lacunar infarctions. Few nonspecific hypodensities in white matter compatible with mild chronic microvascular ischemic changes for age. Mild diffuse volume loss of the brain. Vascular: Calcific atherosclerosis of carotid siphons. No hyperdense vessel. Skull: Normal. Negative for fracture or focal lesion. Sinuses/Orbits: No acute finding. Other: None. IMPRESSION: 1. No acute intracranial abnormality identified. 2. Mild chronic microvascular ischemic changes and mild volume loss of the brain for age.  Electronically Signed   By: Kristine Garbe M.D.   On: 08/23/2018 00:00  ASSESSMENT AND PLAN:   73 year old male with a history of chronic anemia, CLL and multiple blood transfusion in the past who presented to the emergency room with dizziness and generalized weakness and found to have a hemoglobin of 3.1.   1.  Acute on chronic symptomatic anemia in the setting of underlying leukemia: Patient has received multiple units of PRBCs and hemoglobin is up to 8 this morning Follow-up on oncology evaluation  2.  Acute kidney injury in the setting of severe anemia with ATN: Creatinine is improving Hold nephrotoxic medications and follow BMP  3.  CLL: Oncology evaluation  4.  Severe protein calorie malnutrition: Continue Marinol  5.  Leukocytosis: Ruling out abdominal pathology with CT the abdomen Continue Zosyn for now 6.  Lower extremity edema: Doppler ultrasound to rule out DVT  Management plans discussed with the patient and family and they are in agreement.  CODE STATUS: full  TOTAL TIME TAKING CARE OF THIS PATIENT: 30 minutes.     POSSIBLE D/C 2 days, DEPENDING ON CLINICAL CONDITION.   Judene Logue M.D on 08/23/2018 at 10:42 AM  Between 7am to 6pm - Pager - [phone] After 6pm go to [email or web address] - password [email or web address] [facility]  Office  [phone]  CC: Primary care physician; Guadalupe Maple, MD  Note: This dictation was prepared with Dragon dictation along with smaller phrase technology. Any transcriptional errors that result from this process are unintentional.

## 2018-08-23 NOTE — Progress Notes (Signed)
Initial Nutrition Assessment  DOCUMENTATION CODES:   Severe malnutrition in context of chronic illness, Underweight  INTERVENTION:  Provide Ensure Enlive po TID, each supplement provides 350 kcal and 20 grams of protein. Patient prefers strawberry.  Patient's Megace dose is inadequate to stimulate appetite. Consider increasing to 400 mg BID.  Encouraged intake of small, frequent meals throughout the day. Discussed choosing calorie- and protein-dense foods/beverages in setting of anorexia and early satiety. Encouraged patient to drink a high-calorie, high-protein ONS 2-3 times daily between meals at home.  NUTRITION DIAGNOSIS:   Severe Malnutrition related to chronic illness(COPD, hx RCC, CLL) as evidenced by severe fat depletion, severe muscle depletion.  GOAL:   Patient will meet greater than or equal to 90% of their needs  MONITOR:   PO intake, Supplement acceptance, Labs, Skin, I & O's, Weight trends  REASON FOR ASSESSMENT:   Consult Assessment of nutrition requirement/status  ASSESSMENT:   73 year old male with PMHx of anxiety, COPD, HTN, hx RCC s/p robotic assisted partial left nephrectomy 11/14/2015, CLL s/p cycle 3 of Rituxan and Treanda in early 07/2018 (cycle 4 held in setting of persistent anemia) who is now admitted with symptomatic acute blood loss anemia, acute on chronic renal failure, malnutrition, and leukocytosis.   Met with patient, his son, and another family member at bedside. Patient reports he has had a decreased appetite for years now. His intake depends on the day. Some days he can eat a few good meals and other days he does not eat much at all. He reports anorexia and early satiety. Appetite worsens after chemotherapy (last received a few weeks ago). He is not drinking oral nutrition supplements at home but is amenable to start drinking them to better meet calorie/protein needs.   Patient reports UBW was 150 lbs at most as he has always had a smaller build.  He has lost weight slowly over time. Currently 58.5 kg (128.97 lbs).  Medications reviewed and include: Megace 40 mg daily, pantoprazole, Zosyn.   Labs reviewed: Chloride 120, CO2 13, BUN 50, Creatinine 2.18.  NUTRITION - FOCUSED PHYSICAL EXAM:    Most Recent Value  Orbital Region  Moderate depletion  Upper Arm Region  Severe depletion  Thoracic and Lumbar Region  Severe depletion  Buccal Region  Severe depletion  Temple Region  Severe depletion  Clavicle Bone Region  Severe depletion  Clavicle and Acromion Bone Region  Severe depletion  Scapular Bone Region  Severe depletion  Dorsal Hand  Moderate depletion  Patellar Region  Severe depletion  Anterior Thigh Region  Severe depletion  Posterior Calf Region  Moderate depletion  Edema (RD Assessment)  None  Hair  Reviewed  Eyes  Reviewed  Mouth  Reviewed  Skin  Reviewed  Nails  Reviewed     Diet Order:   Diet Order            Diet regular Room service appropriate? Yes; Fluid consistency: Thin  Diet effective now             EDUCATION NEEDS:   Education needs have been addressed  Skin:  Skin Assessment: Reviewed RN Assessment  Last BM:  Unknown/PTA  Height:   Ht Readings from Last 1 Encounters:  09/07/2018 5' 11" (1.803 m)   Weight:   Wt Readings from Last 1 Encounters:  09/04/2018 58.5 kg   Ideal Body Weight:  78.2 kg  BMI:  Body mass index is 17.99 kg/m.  Estimated Nutritional Needs:   Kcal:  1780-2045 (  MSJ x 1.3-1.5)  Protein:  85-100 grams (1.5-1.7 grams/kg)  Fluid:  1.7-2 L/day (1 mL/kcal)  Leanne Stephens, MS, RD, LDN Office: [phone] Pager: [phone] After Hours/Weekend Pager: [phone]  

## 2018-08-23 NOTE — Progress Notes (Addendum)
CRITICAL CARE NOTE  CC  Altered mental status with lethargy, severe anemia requiring blood products transfused.  SUBJECTIVE  Resting in bed comfortably, no new complaints.  Overnight events noted. Discussed case with family at bedside including son Whitt Auletta   SIGNIFICANT EVENTS -Consfusion greatly improved.  H&H monitoring, anemia improved. -discussed case with nutritionist - megace is low dose but pt may have DVT on RLE so we will not increase megace instead adding marinol to potentiate appetite.   Vitals:   08/23/18 0715 08/23/18 0800  BP: 124/74   Pulse: (!) 101 (!) 105  Resp: (!) 29 (!) 21  Temp:  97.9 F (36.6 C)  SpO2: 100% 100%     REVIEW OF SYSTEMS  PATIENT IS UNABLE TO PROVIDE COMPLETE REVIEW OF SYSTEMS DUE TO confusion     PHYSICAL EXAMINATION:  GENERAL: No apparent distress HEAD: Normocephalic, atraumatic.  EYES: Pupils equal, round, reactive to light.  No scleral icterus.  MOUTH: Moist mucosal membrane. NECK: Supple. No thyromegaly. No nodules. No JVD.  PULMONARY: Clear to auscultation bilaterally CARDIOVASCULAR: S1 and S2. Regular rate and rhythm. No murmurs, rubs, or gallops.  GASTROINTESTINAL: Soft, nontender, non--distended. No masses. Positive bowel sounds. No hepatosplenomegaly.  MUSCULOSKELETAL: RLE swelling compared to left, clubbing, or edema.  NEUROLOGIC: Mild distress due to acute illness SKIN:intact,warm,dry   Labs and Imaging:     -I personally reviewed most recent blood work, imaging and microbiology - significant findings today are metabolic acidosis, acute kidney injury, hyperglycemia, anemia leukocytosis  LAB RESULTS: Recent Labs  Lab 09/04/2018 1348 08/21/2018 2111 08/23/18 0821  NA 142 142 141  K 5.2* 5.5* 4.6  CL 120* 119* 120*  CO2 14* 12* 13*  BUN 50* 51* 50*  CREATININE 2.34* 2.31* 2.18*  GLUCOSE 110* 115* 190*   Recent Labs  Lab 08/19/2018 1540 09/05/2018 2111 08/23/18 0821  HGB 3.1* 7.1* 8.4*  HCT 9.1* 19.7* 23.3*   WBC 23.6* 20.2* 18.6*  PLT 296 303 243     IMAGING RESULTS: Ct Head Wo Contrast  Result Date: 08/23/2018 CLINICAL DATA:  73 y/o  M; dizziness and weakness. EXAM: CT HEAD WITHOUT CONTRAST TECHNIQUE: Contiguous axial images were obtained from the base of the skull through the vertex without intravenous contrast. COMPARISON:  08/07/2018 CT head. FINDINGS: Brain: No evidence of acute infarction, hemorrhage, hydrocephalus, extra-axial collection or mass lesion/mass effect. Small hypodensities bilaterally within subinsular white matter probably represents chronic lacunar infarctions. Few nonspecific hypodensities in white matter compatible with mild chronic microvascular ischemic changes for age. Mild diffuse volume loss of the brain. Vascular: Calcific atherosclerosis of carotid siphons. No hyperdense vessel. Skull: Normal. Negative for fracture or focal lesion. Sinuses/Orbits: No acute finding. Other: None. IMPRESSION: 1. No acute intracranial abnormality identified. 2. Mild chronic microvascular ischemic changes and mild volume loss of the brain for age. Electronically Signed   By: Kristine Garbe M.D.   On: 08/23/2018 00:00     ASSESSMENT AND PLAN SYNOPSIS   Acute severe anemia  -Unclear whether acute blood loss versus hemolysis versus autoimmune antibody induced -Status post multiple PRBC transfusions H&H stabilizing -History of leukemia   Renal Failure-most likely due to ATN -Improved post blood products transfusion -follow chem 7 -follow UO -continue Foley Catheter-assess need daily   Right lower extermity swelling compared to left     ?poss acute/chronic DVT     -recommend US-LE  NEUROLOGY -Lethargic mental status improved   ID -Leukocytosis possibly secondary to sepsis- unclear nidus as of yet -continue IV  abx as prescibed -follow up cultures  GI/Nutrition GI PROPHYLAXIS as indicated-Protonix DIET-->TF's as tolerated Constipation protocol as  indicated  ENDO -l-hyperglycemia - insulin sliding scale -check FSBS per protocol   ELECTROLYTES -follow labs as needed -replace as needed -pharmacy consultation   DVT/GI PRX ordered -SCDs  TRANSFUSIONS AS NEEDED MONITOR FSBS ASSESS the need for LABS as needed   Critical care provider statement:    Critical care time (minutes):  35   Critical care time was exclusive of:  Separately billable procedures and  treating other patients   Critical care was necessary to treat or prevent imminent or  life-threatening deterioration of the following conditions:   Altered mental status with lethargy, severe anemia requiring blood transfusions.  Severe leukocytosis   Critical care was time spent personally by me on the following  activities:  Development of treatment plan with patient or surrogate,  discussions with consultants, evaluation of patient's response to  treatment, examination of patient, obtaining history from patient or  surrogate, ordering and performing treatments and interventions, ordering  and review of laboratory studies and re-evaluation of patient's condition   I assumed direction of critical care for this patient from another  provider in my specialty: no     Ottie Glazier, M.D.  [facility]

## 2018-08-24 ENCOUNTER — Inpatient Hospital Stay: Payer: Medicare Other

## 2018-08-24 DIAGNOSIS — E43 Unspecified severe protein-calorie malnutrition: Secondary | ICD-10-CM

## 2018-08-24 DIAGNOSIS — I469 Cardiac arrest, cause unspecified: Secondary | ICD-10-CM

## 2018-08-24 LAB — TYPE AND SCREEN
ABO/RH(D): O NEG
Antibody Screen: POSITIVE
DAT, IgG: POSITIVE
DAT, complement: POSITIVE
Unit division: 0
Unit division: 0
Unit division: 0
Unit division: 0

## 2018-08-24 LAB — PATHOLOGIST SMEAR REVIEW

## 2018-08-24 LAB — BPAM RBC
Blood Product Expiration Date: 202002182359
Blood Product Expiration Date: 202002192359
Blood Product Expiration Date: 202002272359
Blood Product Expiration Date: 202003032359
ISSUE DATE / TIME: 202002081720
ISSUE DATE / TIME: 202002081720
ISSUE DATE / TIME: 202002090113
ISSUE DATE / TIME: 202002090402
UNIT TYPE AND RH: 9500
Unit Type and Rh: 9500
Unit Type and Rh: 9500
Unit Type and Rh: 9500

## 2018-08-24 LAB — BASIC METABOLIC PANEL
ANION GAP: 7 (ref 5–15)
BUN: 51 mg/dL — ABNORMAL HIGH (ref 8–23)
CO2: 16 mmol/L — ABNORMAL LOW (ref 22–32)
Calcium: 9 mg/dL (ref 8.9–10.3)
Chloride: 117 mmol/L — ABNORMAL HIGH (ref 98–111)
Creatinine, Ser: 2.01 mg/dL — ABNORMAL HIGH (ref 0.61–1.24)
GFR, EST AFRICAN AMERICAN: 38 mL/min — AB (ref >60)
GFR, EST NON AFRICAN AMERICAN: 32 mL/min — AB (ref >60)
Glucose, Bld: 119 mg/dL — ABNORMAL HIGH (ref 70–99)
Potassium: 4.4 mmol/L (ref 3.5–5.1)
Sodium: 140 mmol/L (ref 135–145)

## 2018-08-24 LAB — CBC
HCT: 20.3 % — ABNORMAL LOW (ref 39.0–52.0)
Hemoglobin: 7.3 g/dL — ABNORMAL LOW (ref 13.0–17.0)
MCH: 34.8 pg — ABNORMAL HIGH (ref 26.0–34.0)
MCHC: 36 g/dL (ref 30.0–36.0)
MCV: 96.7 fL (ref 80.0–100.0)
Platelets: 227 10*3/uL (ref 150–400)
RBC: 2.1 MIL/uL — AB (ref 4.22–5.81)
RDW: 19.9 % — ABNORMAL HIGH (ref 11.5–15.5)
WBC: 19.7 10*3/uL — ABNORMAL HIGH (ref 4.0–10.5)
nRBC: 2.9 % — ABNORMAL HIGH (ref 0.0–0.2)

## 2018-08-24 LAB — PROTIME-INR
INR: 1.53
Prothrombin Time: 18.2 seconds — ABNORMAL HIGH (ref 11.4–15.2)

## 2018-08-24 LAB — APTT: aPTT: 32 seconds (ref 24–36)

## 2018-08-24 LAB — VITAMIN B12: Vitamin B-12: 330 pg/mL (ref 180–914)

## 2018-08-24 MED ORDER — MORPHINE 100MG IN NS 100ML (1MG/ML) PREMIX INFUSION
1.0000 mg/h | INTRAVENOUS | Status: DC
  Filled 2018-08-24: qty 100

## 2018-08-24 MED ORDER — ROCURONIUM BROMIDE 50 MG/5ML IV SOLN
INTRAVENOUS | Status: AC
  Filled 2018-08-24: qty 1

## 2018-08-24 MED ORDER — DOPAMINE-DEXTROSE 3.2-5 MG/ML-% IV SOLN: 0.0000 ug/kg/min | INTRAVENOUS | Status: DC

## 2018-08-24 MED ORDER — LORAZEPAM 1 MG PO TABS
1.0000 mg | ORAL_TABLET | Freq: Two times a day (BID) | ORAL | Status: DC | PRN
  Administered 2018-08-24: 1 mg via ORAL
  Filled 2018-08-24: qty 1

## 2018-08-24 MED ORDER — IPRATROPIUM BROMIDE 0.03 % NA SOLN
2.0000 | Freq: Two times a day (BID) | NASAL | Status: DC
  Administered 2018-08-24: 2 via NASAL
  Filled 2018-08-24: qty 30

## 2018-08-24 MED ORDER — ETOMIDATE 2 MG/ML IV SOLN: 30.0000 mg | Freq: Once | INTRAVENOUS | Status: DC

## 2018-08-24 MED ORDER — SUCCINYLCHOLINE CHLORIDE 20 MG/ML IJ SOLN
INTRAMUSCULAR | Status: AC
  Filled 2018-08-24: qty 1

## 2018-08-24 MED ORDER — ETOMIDATE 2 MG/ML IV SOLN
INTRAVENOUS | Status: AC
  Filled 2018-08-24: qty 10

## 2018-08-24 MED ORDER — BUDESONIDE 0.5 MG/2ML IN SUSP: 0.5000 mg | Freq: Two times a day (BID) | RESPIRATORY_TRACT | Status: DC

## 2018-08-24 MED ORDER — ENSURE ENLIVE PO LIQD
237.0000 mL | Freq: Three times a day (TID) | ORAL | Status: DC
  Administered 2018-08-24: 237 mL via ORAL

## 2018-08-24 MED ORDER — SODIUM BICARBONATE 8.4 % IV SOLN
100.0000 meq | Freq: Once | INTRAVENOUS | Status: DC
  Filled 2018-08-24: qty 100

## 2018-08-24 MED ORDER — ROCURONIUM BROMIDE 50 MG/5ML IV SOLN: 30.0000 mg | Freq: Once | INTRAVENOUS | Status: DC

## 2018-08-24 MED ORDER — NOREPINEPHRINE 4 MG/250ML-% IV SOLN: 0.0000 ug/min | INTRAVENOUS | Status: DC

## 2018-08-24 MED ORDER — METHYLPREDNISOLONE SODIUM SUCC 125 MG IJ SOLR
60.0000 mg | Freq: Every day | INTRAMUSCULAR | Status: DC
  Administered 2018-08-24: 60 mg via INTRAVENOUS
  Filled 2018-08-24: qty 2

## 2018-08-25 LAB — HAPTOGLOBIN: Haptoglobin: <10 mg/dL — ABNORMAL LOW (ref 34–355)

## 2018-08-25 MED FILL — Medication: Qty: 1 | Status: AC

## 2018-08-26 ENCOUNTER — Inpatient Hospital Stay: Payer: Medicare Other

## 2018-08-27 ENCOUNTER — Inpatient Hospital Stay: Payer: Medicare Other | Admitting: Oncology

## 2018-08-27 ENCOUNTER — Inpatient Hospital Stay: Payer: Medicare Other

## 2018-08-27 ENCOUNTER — Telehealth: Payer: Self-pay

## 2018-08-27 ENCOUNTER — Other Ambulatory Visit: Payer: Medicare Other

## 2018-08-27 NOTE — Telephone Encounter (Signed)
Death certificate received and placed in LG box for signing.

## 2018-08-28 NOTE — Telephone Encounter (Signed)
Death certificate complete. [facility] aware ready to pick up.

## 2018-09-11 LAB — BLOOD GAS, ARTERIAL
FIO2: 1
Patient temperature: 37
pCO2 arterial: 46 mmHg (ref 32.0–48.0)

## 2018-09-13 NOTE — Progress Notes (Signed)
[place] [facility] [place] NAME: Julian Medina    MR#:  [id number]  DATE OF BIRTH:  [birth year]  SUBJECTIVE:   Patient doing well this am Does not wear O2 at home but is on O2 here 99% 2 L  REVIEW OF SYSTEMS:    Review of Systems  Constitutional: Negative for fever, chills weight loss HENT: Negative for ear pain, nosebleeds, congestion, facial swelling, rhinorrhea, neck pain, neck stiffness and ear discharge.   Respiratory: Negative for cough, shortness of breath, wheezing  Cardiovascular: Negative for chest pain, palpitations and leg swelling.  Gastrointestinal: Negative for heartburn, abdominal pain, vomiting, diarrhea or consitpation Genitourinary: Negative for dysuria, urgency, frequency, hematuria Musculoskeletal: Negative for back pain or joint pain Neurological: Negative for dizziness, seizures, syncope, focal weakness,  numbness and headaches.  Hematological: Does bruise/bleed easily.  Psychiatric/Behavioral: Negative for hallucinations, confusion, dysphoric mood    Tolerating Diet:yes      DRUG ALLERGIES:   Allergies  Allergen Reactions  . Aspirin Other (See Comments)    Ulcer   . Hydrochlorothiazide Itching    VITALS:  Blood pressure 115/76, pulse (!) 104, temperature 98.2 F (36.8 C), temperature source Oral, resp. rate (!) 26, height 5\' 11"  (1.803 m), weight 58.5 kg, SpO2 94 %.  PHYSICAL EXAMINATION:  Constitutional: Appears thin No distress. HENT: Normocephalic. Marland Kitchen Oropharynx is clear and moist.  Eyes: Conjunctivae and EOM are normal. PERRLA, no scleral icterus.  Neck: Normal ROM. Neck supple. No JVD. No tracheal deviation. CVS: RRR, S1/S2 +, no murmurs, no gallops, no carotid bruit.  Pulmonary: Effort and breath sounds normal, no stridor, rhonchi, wheezes, rales.  Abdominal: Soft. BS +,  no distension, tenderness, rebound or guarding.  Musculoskeletal: Normal range of motion. R>L LEE and no tenderness.  Neuro: Alert.  CN 2-12 grossly intact. No focal deficits. Skin: Skin is warm and dry. No rash noted. Psychiatric: Normal mood and affect.      LABORATORY PANEL:   CBC Recent Labs  Lab 08/25/18 0611  WBC 19.7*  HGB 7.3*  HCT 20.3*  PLT 227   ------------------------------------------------------------------------------------------------------------------  Chemistries  Recent Labs  Lab 08/19/2018 2111 08/23/18 0542  08/23/18 1345 08-25-2018 0611  NA 142  --    < >  --  140  K 5.5*  --    < >  --  4.4  CL 119*  --    < >  --  117*  CO2 12*  --    < >  --  16*  GLUCOSE 115*  --    < >  --  119*  BUN 51*  --    < >  --  51*  CREATININE 2.31*  --    < >  --  2.01*  CALCIUM 9.5  --    < >  --  9.0  MG 2.6* 2.3  --   --   --   AST 38  --   --   --   --   ALT 17  --   --   --   --   ALKPHOS 73  --   --   --   --   BILITOT 3.6*  --   --  3.9*  --    < > = values in this interval not displayed.   ------------------------------------------------------------------------------------------------------------------  Cardiac Enzymes Recent Labs  Lab 09/09/2018 2111  TROPONINI 0.06*   ------------------------------------------------------------------------------------------------------------------  RADIOLOGY:  Ct Head Wo Contrast  Result  Date: 08/23/2018 CLINICAL DATA:  73 y/o  M; dizziness and weakness. EXAM: CT HEAD WITHOUT CONTRAST TECHNIQUE: Contiguous axial images were obtained from the base of the skull through the vertex without intravenous contrast. COMPARISON:  08/07/2018 CT head. FINDINGS: Brain: No evidence of acute infarction, hemorrhage, hydrocephalus, extra-axial collection or mass lesion/mass effect. Small hypodensities bilaterally within subinsular white matter probably represents chronic lacunar infarctions. Few nonspecific hypodensities in white matter compatible with mild chronic microvascular ischemic changes for age. Mild diffuse volume loss of the brain. Vascular: Calcific  atherosclerosis of carotid siphons. No hyperdense vessel. Skull: Normal. Negative for fracture or focal lesion. Sinuses/Orbits: No acute finding. Other: None. IMPRESSION: 1. No acute intracranial abnormality identified. 2. Mild chronic microvascular ischemic changes and mild volume loss of the brain for age. Electronically Signed   By: Kristine Garbe M.D.   On: 08/23/2018 00:00   US Venous Img Lower Unilateral Right  Result Date: 09-16-18 CLINICAL DATA:  Right lower extremity pain and edema for the past 2 weeks. History of leukemia. Evaluate for DVT. EXAM: RIGHT LOWER EXTREMITY VENOUS DOPPLER ULTRASOUND TECHNIQUE: Gray-scale sonography with graded compression, as well as color Doppler and duplex ultrasound were performed to evaluate the lower extremity deep venous systems from the level of the common femoral vein and including the common femoral, femoral, profunda femoral, popliteal and calf veins including the posterior tibial, peroneal and gastrocnemius veins when visible. The superficial great saphenous vein was also interrogated. Spectral Doppler was utilized to evaluate flow at rest and with distal augmentation maneuvers in the common femoral, femoral and popliteal veins. COMPARISON:  None. FINDINGS: Contralateral Common Femoral Vein: Respiratory phasicity is normal and symmetric with the symptomatic side. No evidence of thrombus. Normal compressibility. Common Femoral Vein: No evidence of thrombus. Normal compressibility, respiratory phasicity and response to augmentation. Saphenofemoral Junction: No evidence of thrombus. Normal compressibility and flow on color Doppler imaging. Profunda Femoral Vein: No evidence of thrombus. Normal compressibility and flow on color Doppler imaging. Femoral Vein: The proximal and mid aspects of the right femoral vein appear widely patent (images 17 and 20). There is mixed echogenic near occlusive thrombus within the distal aspect the right femoral vein (image  22 and 23. Popliteal Vein: There is mixed echogenic near occlusive thrombus within the right popliteal vein (images 25 through 29). Calf Veins: There is hypoechoic occlusive thrombus within both paired right posterior tibial veins (image 31 through 33 as well as both paired right peroneal veins (images 34 and 35) Superficial Great Saphenous Vein: No evidence of thrombus. Normal compressibility. Other Findings:  None. IMPRESSION: Age-indeterminate near occlusive DVT involving the distal aspect the right femoral vein, extending through the right popliteal vein to involve both paired right posterior tibial and peroneal veins. Mixed echogenicity of the thrombus could suggest a chronic etiology, though in the absence of prior examinations, an acute on chronic process is not excluded. Clinical correlation is advised. Electronically Signed   By: Sandi Mariscal M.D.   On: 2018-09-16 10:37   Dg Chest Port 1 View  Result Date: 2018-09-16 CLINICAL DATA:  Acute respiratory failure EXAM: PORTABLE CHEST 1 VIEW COMPARISON:  Chest CT 08/07/2018 FINDINGS: Porta catheter on the right with tip at the SVC. There is no edema, consolidation, effusion, or pneumothorax. Normal heart size and mediastinal contours. IMPRESSION: No evidence of active disease. Electronically Signed   By: Monte Fantasia M.D.   On: 09/16/2018 07:32     ASSESSMENT AND PLAN:   73 year old male with a history of  chronic anemia, CLL and multiple blood transfusion in the past who presented to the emergency room with dizziness and generalized weakness and found to have a hemoglobin of 3.1.   1.  Acute on chronic symptomatic anemia in the setting of underlying leukemia : Patient has received multiple units of PRBCs  Labs consistent with acute hemolysis Management as per oncology with steroids. Hemoglobin 7.3  2.  Acute kidney injury in the setting of severe anemia with ATN: Creatinine has improved after blood transfusion.   Hold nephrotoxic medications  and follow BMP  3.  CLL: Oncology evaluation appreciated.  4.  Severe protein calorie malnutrition: Continue Marinol  5.  Leukocytosis: Continue Zosyn for now Consider CT of the abdomen  6.  Lower extremity edema: Doppler ultrasound to rule out DVT ordered. ge-indeterminate near occlusive DVT involving the distal aspect the right femoral vein, extending through the right popliteal vein to involve both paired right posterior tibial and peroneal veins. Mixed echogenicity of the thrombus could suggest a chronic etiology, though in the absence of prior examinations, an acute on chronic process is not excluded. Consider vascular surgery consultation for possible IVC once hemolysis resolves as patient cannot get anticoagulation due to hemolysis/anemia. Further input by oncology would be helpful.   Management plans discussed with the patient and family and they are in agreement.  CODE STATUS: full  TOTAL TIME TAKING CARE OF THIS PATIENT: 24 minutes.     POSSIBLE D/C 2-3  days, DEPENDING ON CLINICAL CONDITION.   Kent Braunschweig M.D on 08/29/2018 at 11:28 AM  Between 7am to 6pm - Pager - [phone] After 6pm go to [email or web address] - password [email or web address] [facility]  Office  [phone]  CC: Primary care physician; Guadalupe Maple, MD  Note: This dictation was prepared with Dragon dictation along with smaller phrase technology. Any transcriptional errors that result from this process are unintentional.

## 2018-09-13 NOTE — Death Summary Note (Signed)
DEATH SUMMARY   Patient Details  Name: Julian Medina MRN: [id number] DOB: 05/17/1946  Admission/Discharge Information   Admit Date:  09/05/18  Date of Death: Date of Death: 07-Sep-2018  Time of Death: Time of Death: 10-18-18  Length of Stay: 2  Referring Physician: Guadalupe Maple, MD   Reason(s) for Hospitalization  Symptomatic anemia, Hgb 3.1  Diagnoses  Preliminary cause of death:   1.  Cardiac arrest (PEA) cause unknown. Query severe anemia, 2.  Severe anemia, hemolytic 3.  Severe metabolic acidosis 4.  CLL with frequent transfusion requirement. 5.  Acute on chronic renal failure 6.  Anxiety disorder 7.  Encephalopathy, metabolic 8.  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  At that time his hemoglobin was 3.1.  He required transfusion emergently.  It appeared during his evaluation that he was noted to have significant hemolysis peripherally.  Indicating hemolytic anemia.  The patient also presented with encephalopathy which improved during his admission.  This appeared to have been a metabolic encephalopathy.  The patient also complained of right lower extremity edema both over 2 weeks duration and on the day of his demise he had undergone venous Dopplers that showed chronic DVT of the right lower extremity.  An IVC filter was planned.  The patient could not be anticoagulated due to his severe  anemia issues and hemolysis.  Patient also had issues with anxiety for which he chronically took Xanax.  The patient had improved on admission and had been switched from ICU/stepdown status to MedSurg status and was to be transferred to the regular medical ward.  At approximately 1630 hrs. on September 07, 2022 the patient developed issues with anxiety.  He received a Ativan tablet p.o. and a few minutes after receiving this developed a CODE BLUE situation.  He was noted to be in PEA but responded to oxygen supplementation and volume challenge.  The patient required intubation and mechanical ventilation due to becoming more obtunded.  This was performed by the emergency room physician.  Subsequently after this initial event had resolved and the patient was on the ventilator.  He developed another episode of profound bradycardia and PEA.  He received atropine, epinephrine and return to spontaneous circulation resumed.  The patient had tenuous blood pressure and he was placed initially on a norepinephrine infusion but subsequently on a dopamine infusion.  Arterial blood gases were drawn as well as other laboratory data.  Arterial blood gases reveal profound acidosis and bicarbonate level but could not be calculated.  This was a severe metabolic acidosis.  Of note the arterial blood appeared quite dilute.  CBC, CMP and magnesium could not be determined due to severe hemolysis of the specimens.  A quick look echocardiogram showed severe global hypokinesis.  At this point the situation was discussed with the patient's wife and his brother.  The patient has stabilized though with a very tenuous blood pressure despite norepinephrine and dopamine.  CODE BLUE situation was then stopped.  The family at this point determined that the patient was to be DNR and transition to comfort care.  The patient expired at 1620 hrs. before extubation for comfort care could be done. Family was at bedside with chaplain in attendance.  Pertinent  Labs and Studies  Significant Diagnostic Studies Ct Head Wo Contrast  Result Date: 08/23/2018 CLINICAL DATA:  73 y/o  M; dizziness and weakness. EXAM: CT HEAD WITHOUT CONTRAST TECHNIQUE: Contiguous axial images were obtained from the base of the skull through the vertex without intravenous contrast. COMPARISON:  08/07/2018 CT head. FINDINGS: Brain: No evidence of acute infarction, hemorrhage, hydrocephalus, extra-axial collection or mass lesion/mass effect. Small hypodensities bilaterally within subinsular white matter probably represents chronic lacunar infarctions. Few nonspecific hypodensities in white matter compatible with mild chronic microvascular ischemic changes for age. Mild diffuse volume loss of the brain. Vascular: Calcific atherosclerosis of carotid siphons. No hyperdense vessel. Skull: Normal. Negative for fracture or focal lesion. Sinuses/Orbits: No acute finding. Other: None. IMPRESSION: 1. No acute intracranial abnormality identified. 2. Mild chronic microvascular ischemic changes and mild volume loss of the brain for age. Electronically Signed   By: Kristine Garbe M.D.   On: 08/23/2018 00:00   Ct Soft Tissue Neck W Contrast  Result Date: 08/07/2018 CLINICAL DATA:  73 y/o M; follow-up of chronic lymphocytic leukemia. History of left-sided renal cell carcinoma. EXAM: CT NECK WITH CONTRAST TECHNIQUE: Multidetector CT imaging of the neck was performed using the standard protocol following the bolus administration of intravenous contrast. CONTRAST:  28mL OMNIPAQUE IOHEXOL 300 MG/ML  SOLN COMPARISON:  05/08/2018, 02/17/2017, 03/05/2016 CT of the neck. Concurrent CT of chest, abdomen, and pelvis. FINDINGS: Pharynx and larynx: Normal. No mass or swelling. Salivary glands: No inflammation, mass, or stone. Thyroid: Normal. Lymph nodes: Diffusely decreased cervical lymphadenopathy with reference nodes as follows: 1. Right supraclavicular, 6 mm short axis, previously 7 mm, series 14, image  67. 2. Left level 2B, 5 mm short axis, previously 9 mm, series 14, image 43. 3. Left level 3, 5 mm short axis, previously 9 mm, series 14, image 52. Vascular: Mixed plaque of the left carotid bifurcation with moderate proximal ICA stenosis. Limited intracranial: Negative. Visualized orbits: Negative. Mastoids and visualized paranasal sinuses: Clear. Skeleton: Stable cervical spondylosis with advanced discogenic degenerative changes at the C3-C7 level or uncovertebral and facet hypertrophy contributes to bony foraminal stenosis bilaterally. No high-grade bony spinal canal stenosis. Upper chest: Please refer to concurrent CT of chest for evaluation of axilla, lungs, mediastinum. Other: None. IMPRESSION: Decreased cervical lymphadenopathy. Please refer to concurrent CT of chest for evaluation axillary and mediastinal adenopathy. Electronically Signed   By: Kristine Garbe M.D.   On: 08/07/2018 14:25   Ct Chest W Contrast  Result Date: 08/07/2018 CLINICAL DATA:  Chronic lymphocytic leukemia. LEFT-sided renal cell carcinoma. EXAM: CT CHEST, ABDOMEN, AND PELVIS WITH CONTRAST TECHNIQUE: Multidetector CT imaging of the chest, abdomen and pelvis was performed following the standard protocol during bolus administration of intravenous contrast. CONTRAST:  48mL OMNIPAQUE IOHEXOL 300 MG/ML  SOLN COMPARISON:  05/08/2018 FINDINGS: CT CHEST FINDINGS Cardiovascular: Port in the anterior chest wall with tip in distal SVC. Mediastinum/Nodes: Interval decrease in size of axial lymph nodes. For example 10 mm short axis lymph node in the RIGHT axilla decreased from 23 mm. Similar findings LEFT axilla. No pathologically enlarged mediastinal nodes. Hilar lymph nodes are decreased in size from prior. These are no longer pathologically enlarged where previously measured 2 cm. No pericardial effusion. Lungs/Pleura: No suspicious pulmonary  nodules. Musculoskeletal: No aggressive osseous lesion. CT ABDOMEN AND PELVIS FINDINGS  Hepatobiliary: No focal hepatic lesion.  Gallbladder normal. Pancreas: Pancreas is normal. No ductal dilatation. No pancreatic inflammation. Spleen: Normal spleen volume. Adrenals/urinary tract: Adrenal glands are normal. Low-density lesion extending from the lower pole of the LEFT kidney measuring 11 mm (image 37/2 can not be fully characterize but is unchanged in size from comparison exam. The ureters and bladder normal. Stomach/Bowel: Stomach, small bowel, appendix, and cecum are normal. The colon and rectosigmoid colon are normal. Vascular/Lymphatic: Abdominal aorta normal caliber. Interval reduction in size of LEFT periaortic lymph nodes. For example 10 mm short axis lymph node on image 77/2 decreased from 30 mm. Significant decrease in iliac adenopathy. The largest remaining lymph node is a RIGHT external iliac lymph node measuring 20 mm short axis decreased from 46 mm. Reproductive: Prostate normal Other: No free fluid. Musculoskeletal: No aggressive osseous lesion. IMPRESSION: Chest Impression: 1. Interval decrease in size of axillary and hilar lymphadenopathy. Resolution of mediastinal adenopathy. 2. No evidence of disease progression. 3. Lungs are clear. Abdomen / Pelvis Impression: 1. Interval marked decrease in retroperitoneal and iliac adenopathy. Enlarged RIGHT external iliac lymph nodes remain (but decreased as above). 2. Normal volume spleen. 3. No evidence disease progression. 4. Stable small indeterminate lesion LEFT kidney. Electronically Signed   By: Suzy Bouchard M.D.   On: 08/07/2018 15:51   Ct Abdomen Pelvis W Contrast  Result Date: 08/07/2018 CLINICAL DATA:  Chronic lymphocytic leukemia. LEFT-sided renal cell carcinoma. EXAM: CT CHEST, ABDOMEN, AND PELVIS WITH CONTRAST TECHNIQUE: Multidetector CT imaging of the chest, abdomen and pelvis was performed following the standard protocol during bolus administration of intravenous contrast. CONTRAST:  3mL OMNIPAQUE IOHEXOL 300 MG/ML  SOLN  COMPARISON:  05/08/2018 FINDINGS: CT CHEST FINDINGS Cardiovascular: Port in the anterior chest wall with tip in distal SVC. Mediastinum/Nodes: Interval decrease in size of axial lymph nodes. For example 10 mm short axis lymph node in the RIGHT axilla decreased from 23 mm. Similar findings LEFT axilla. No pathologically enlarged mediastinal nodes. Hilar lymph nodes are decreased in size from prior. These are no longer pathologically enlarged where previously measured 2 cm. No pericardial effusion. Lungs/Pleura: No suspicious pulmonary nodules. Musculoskeletal: No aggressive osseous lesion. CT ABDOMEN AND PELVIS FINDINGS Hepatobiliary: No focal hepatic lesion.  Gallbladder normal. Pancreas: Pancreas is normal. No ductal dilatation. No pancreatic inflammation. Spleen: Normal spleen volume. Adrenals/urinary tract: Adrenal glands are normal. Low-density lesion extending from the lower pole of the LEFT kidney measuring 11 mm (image 37/2 can not be fully characterize but is unchanged in size from comparison exam. The ureters and bladder normal. Stomach/Bowel: Stomach, small bowel, appendix, and cecum are normal. The colon and rectosigmoid colon are normal. Vascular/Lymphatic: Abdominal aorta normal caliber. Interval reduction in size of LEFT periaortic lymph nodes. For example 10 mm short axis lymph node on image 77/2 decreased from 30 mm. Significant decrease in iliac adenopathy. The largest remaining lymph node is a RIGHT external iliac lymph node measuring 20 mm short axis decreased from 46 mm. Reproductive: Prostate normal Other: No free fluid. Musculoskeletal: No aggressive osseous lesion. IMPRESSION: Chest Impression: 1. Interval decrease in size of axillary and hilar lymphadenopathy. Resolution of mediastinal adenopathy. 2. No evidence of disease progression. 3. Lungs are clear. Abdomen / Pelvis Impression: 1. Interval marked decrease in retroperitoneal and iliac adenopathy. Enlarged RIGHT external iliac lymph nodes  remain (but decreased as above). 2. Normal volume spleen. 3. No evidence disease progression. 4. Stable small indeterminate  lesion LEFT kidney. Electronically Signed   By: Suzy Bouchard M.D.   On: 08/07/2018 15:51   US Venous Img Lower Unilateral Right  Result Date: Sep 02, 2018 CLINICAL DATA:  Right lower extremity pain and edema for the past 2 weeks. History of leukemia. Evaluate for DVT. EXAM: RIGHT LOWER EXTREMITY VENOUS DOPPLER ULTRASOUND TECHNIQUE: Gray-scale sonography with graded compression, as well as color Doppler and duplex ultrasound were performed to evaluate the lower extremity deep venous systems from the level of the common femoral vein and including the common femoral, femoral, profunda femoral, popliteal and calf veins including the posterior tibial, peroneal and gastrocnemius veins when visible. The superficial great saphenous vein was also interrogated. Spectral Doppler was utilized to evaluate flow at rest and with distal augmentation maneuvers in the common femoral, femoral and popliteal veins. COMPARISON:  None. FINDINGS: Contralateral Common Femoral Vein: Respiratory phasicity is normal and symmetric with the symptomatic side. No evidence of thrombus. Normal compressibility. Common Femoral Vein: No evidence of thrombus. Normal compressibility, respiratory phasicity and response to augmentation. Saphenofemoral Junction: No evidence of thrombus. Normal compressibility and flow on color Doppler imaging. Profunda Femoral Vein: No evidence of thrombus. Normal compressibility and flow on color Doppler imaging. Femoral Vein: The proximal and mid aspects of the right femoral vein appear widely patent (images 17 and 20). There is mixed echogenic near occlusive thrombus within the distal aspect the right femoral vein (image 22 and 23. Popliteal Vein: There is mixed echogenic near occlusive thrombus within the right popliteal vein (images 25 through 29). Calf Veins: There is hypoechoic occlusive  thrombus within both paired right posterior tibial veins (image 31 through 33 as well as both paired right peroneal veins (images 34 and 35) Superficial Great Saphenous Vein: No evidence of thrombus. Normal compressibility. Other Findings:  None. IMPRESSION: Age-indeterminate near occlusive DVT involving the distal aspect the right femoral vein, extending through the right popliteal vein to involve both paired right posterior tibial and peroneal veins. Mixed echogenicity of the thrombus could suggest a chronic etiology, though in the absence of prior examinations, an acute on chronic process is not excluded. Clinical correlation is advised. Electronically Signed   By: Sandi Mariscal M.D.   On: September 02, 2018 10:37   Dg Chest Port 1 View  Result Date: 09/02/18 CLINICAL DATA:  Acute respiratory failure EXAM: PORTABLE CHEST 1 VIEW COMPARISON:  Chest CT 08/07/2018 FINDINGS: Porta catheter on the right with tip at the SVC. There is no edema, consolidation, effusion, or pneumothorax. Normal heart size and mediastinal contours. IMPRESSION: No evidence of active disease. Electronically Signed   By: Monte Fantasia M.D.   On: 2018/09/02 07:32    Microbiology Recent Results (from the past 240 hour(s))  MRSA PCR Screening     Status: None   Collection Time: 08/20/2018  8:29 PM  Result Value Ref Range Status   MRSA by PCR NEGATIVE NEGATIVE Final    Comment:        The GeneXpert MRSA Assay (FDA approved for NASAL specimens only), is one component of a comprehensive MRSA colonization surveillance program. It is not intended to diagnose MRSA infection nor to guide or monitor treatment for MRSA infections. Performed at [facility], [address] [place], [place] [address]     Lab Basic Metabolic Panel: Recent Labs  Lab 08/23/2018 1348 08/18/2018 2111 08/23/18 0542 08/23/18 0821 09-02-18 0611  NA 142 142  --  141 140  K 5.2* 5.5*  --  4.6 4.4  CL 120* 119*  --  120* 117*  CO2 14* 12*  --  13*  16*  GLUCOSE 110* 115*  --  190* 119*  BUN 50* 51*  --  50* 51*  CREATININE 2.34* 2.31*  --  2.18* 2.01*  CALCIUM 9.0 9.5  --  8.8* 9.0  MG  --  2.6* 2.3  --   --   PHOS  --  3.8 2.8  --   --    Liver Function Tests: Recent Labs  Lab 08/29/2018 2111 08/23/18 1345  AST 38  --   ALT 17  --   ALKPHOS 73  --   BILITOT 3.6* 3.9*  PROT 7.6  --   ALBUMIN 4.5  --    No results for input(s): LIPASE, AMYLASE in the last 168 hours. No results for input(s): AMMONIA in the last 168 hours. CBC: Recent Labs  Lab 08/21/2018 1540 09/09/2018 2111 08/23/18 0821 2018-09-04 0611  WBC 23.6* 20.2* 18.6*  18.6* 19.7*  NEUTROABS  --   --  14.8*  --   HGB 3.1* 7.1* 8.2*  8.4* 7.3*  HCT 9.1* 19.7* 21.6*  23.3* 20.3*  MCV 100.0 101.5* 103.8*  94.7 96.7  PLT 296 303 248  243 227   Cardiac Enzymes: Recent Labs  Lab 08/17/2018 2111  TROPONINI 0.06*   Sepsis Labs: Recent Labs  Lab 09/10/2018 1540 08/29/2018 2111 08/23/18 0821 04-Sep-2018 0611  PROCALCITON  --  1.61  --   --   WBC 23.6* 20.2* 18.6*  18.6* 19.7*  LATICACIDVEN  --  2.8*  --   --     Procedures/Operations  CODE BLUE x 2      C, Derrill Kay, MD [facility] PCC[facility] 2018-09-04, 7:54 PM

## 2018-09-13 NOTE — Progress Notes (Signed)
   09/18/18 1800  Clinical Encounter Type  Visited With Family;Health care provider  Visit Type Code  Referral From Nurse  Spiritual Encounters  Spiritual Needs Emotional  Stress Factors  Family Stress Factors Health changes;Loss   OR received for a Code Blue for this patient. Upon arrival, medical team was assessing patient's status and providing care; patient unresponsive with his wife at the bedside. Chaplain maintained pastoral presence outside of patient's room and offered silent, energetic prayer. Medical team cleared a path to allow the chaplain to be with patient's wife. Chaplain offered words of comfort and compassionate presence while embracing patient's wife and holding her hand as medical team performed CPR. Patient's wife lost her father on the same day the patient was admitted to the hospital. His funeral is on Saturday, 2/15. She tearfully states that "this is too much to bear." She shared that she and her husband have been married for nearly 18 years. Patient's brother then arrived and chaplain welcomed him into the room, offering a seat next to his sister-in-law (patient's wife). Chaplain was then updated on patient being made comfort care only; patient to be extubated. Chaplain will return to provide care and support to the family at that time.

## 2018-09-13 NOTE — Progress Notes (Signed)
Pt to ultrasound

## 2018-09-13 NOTE — Progress Notes (Signed)
Pt returned from ultrasound

## 2018-09-13 NOTE — Progress Notes (Signed)
Pt made CMO before vent check could be done

## 2018-09-13 NOTE — Progress Notes (Signed)
[place]  [phone] [phone]  ID: Julian Medina OB: [birth year]  MR#: [id number]  LGX#:[id number]  Patient Care Team: Guadalupe Maple, MD as PCP - General (Family Medicine) Lloyd Huger, MD as Consulting Physician (Oncology) [name], MD as Consulting Physician (Urology)  CHIEF COMPLAINT: Hemolytic anemia, CLL.  INTERVAL HISTORY: Patient has persistent weakness and fatigue and shortness of breath, but admits this is improved since admission.  His hemoglobin significantly improved with transfusion after being admitted with hemoglobin 3.1.  He received 60 mg IV Solu-Medrol yesterday.  Patient offers no further specific complaints.  REVIEW OF SYSTEMS:   Review of Systems  Constitutional: Positive for malaise/fatigue. Negative for fever and weight loss.  Respiratory: Positive for shortness of breath. Negative for cough and hemoptysis.   Cardiovascular: Negative.  Negative for chest pain and leg swelling.  Gastrointestinal: Negative.  Negative for abdominal pain, blood in stool, melena, nausea and vomiting.  Genitourinary: Negative.  Negative for hematuria.  Musculoskeletal: Negative.  Negative for back pain.  Skin: Negative.  Negative for rash.  Neurological: Positive for weakness. Negative for dizziness, focal weakness and headaches.  Psychiatric/Behavioral: The patient is nervous/anxious.     As per HPI. Otherwise, a complete review of systems is negative.  PAST MEDICAL HISTORY: Past Medical History:  Diagnosis Date  . Anxiety   . CLL (chronic lymphocytic leukemia) ([place])   . COPD (chronic obstructive pulmonary disease) ([place])   . Hypertension     PAST SURGICAL HISTORY: Past Surgical History:  Procedure Laterality Date  . HERNIA REPAIR    . PERIPHERAL VASCULAR CATHETERIZATION N/A 03/14/2016   Procedure: Glori Luis Cath Insertion;  Surgeon: Algernon Huxley, MD;  Location: [facility];  Service: Cardiovascular;   Laterality: N/A;  . ROBOTIC ASSITED PARTIAL NEPHRECTOMY Left 11/14/2015   Procedure: ROBOTIC ASSITED PARTIAL NEPHRECTOMY with intraoperative drop in ultrasound / Extensive Lysis of adhesions;  Surgeon: [name], MD;  Location: [facility];  Service: Urology;  Laterality: Left;    FAMILY HISTORY: Family History  Problem Relation Age of Onset  . Hearing loss Brother     ADVANCED DIRECTIVES (Y/N):  @ADVDIR @  HEALTH MAINTENANCE: Social History   Tobacco Use  . Smoking status: Current Every Day Smoker    Packs/day: 1.00    Years: 40.00    Pack years: 40.00    Types: Cigarettes  . Smokeless tobacco: Never Used  Substance Use Topics  . Alcohol use: Yes    Alcohol/week: 7.0 standard drinks    Types: 7 Cans of beer per week  . Drug use: No     Colonoscopy:  PAP:  Bone density:  Lipid panel:  Allergies  Allergen Reactions  . Aspirin Other (See Comments)    Ulcer   . Hydrochlorothiazide Itching    Current Facility-Administered Medications  Medication Dose Route Frequency Provider Last Rate Last Dose  . 0.9 %  sodium chloride infusion  250 mL Intravenous PRN Demetrios Loll, MD      . acetaminophen (TYLENOL) tablet 650 mg  650 mg Oral Q6H PRN Demetrios Loll, MD       Or  . acetaminophen (TYLENOL) suppository 650 mg  650 mg Rectal Q6H PRN Demetrios Loll, MD      . albuterol (PROVENTIL) (2.5 MG/3ML) 0.083% nebulizer solution 2.5 mg  2.5 mg Nebulization Q2H PRN Demetrios Loll, MD   2.5 mg at 08/23/18 2235  . bisacodyl (DULCOLAX) EC tablet 5 mg  5 mg Oral Daily PRN Bridgett Larsson,  Sheppard Evens, MD      . budesonide (PULMICORT) nebulizer solution 0.5 mg  0.5 mg Nebulization BID Tyler Pita, MD      . diphenhydrAMINE (BENADRYL) injection 25 mg  25 mg Intravenous QID PRN Demetrios Loll, MD      . dronabinol (MARINOL) capsule 2.5 mg  2.5 mg Oral BID AC Ottie Glazier, MD   2.5 mg at 11-Sep-2018 1155  . feeding supplement (ENSURE ENLIVE) (ENSURE ENLIVE) liquid 237 mL  237 mL Oral TID BM Tyler Pita, MD   237  mL at 09/11/2018 1429  . HYDROcodone-acetaminophen (NORCO/VICODIN) 5-325 MG per tablet 1 tablet  1 tablet Oral Q4H PRN Demetrios Loll, MD   1 tablet at 08/23/18 2052  . ipratropium (ATROVENT) 0.03 % nasal spray 2 spray  2 spray Each Nare Q12H Tukov-Yual, Magdalene S, NP   2 spray at September 11, 2018 0940  . methylPREDNISolone sodium succinate (SOLU-MEDROL) 125 mg/2 mL injection 60 mg  60 mg Intravenous Daily Lloyd Huger, MD      . ondansetron [facility]) tablet 4 mg  4 mg Oral Q6H PRN Demetrios Loll, MD       Or  . ondansetron [facility]) injection 4 mg  4 mg Intravenous Q6H PRN Demetrios Loll, MD      . pantoprazole (PROTONIX) EC tablet 40 mg  40 mg Oral Daily Demetrios Loll, MD   40 mg at 09-11-2018 2536  . senna-docusate (Senokot-S) tablet 1 tablet  1 tablet Oral QHS PRN Demetrios Loll, MD      . sodium chloride flush (NS) 0.9 % injection 10-40 mL  10-40 mL Intracatheter PRN Mody, Sital, MD      . sodium chloride flush (NS) 0.9 % injection 3 mL  3 mL Intravenous Q12H Demetrios Loll, MD   3 mL at 09/11/18 0942  . sodium chloride flush (NS) 0.9 % injection 3 mL  3 mL Intravenous PRN Demetrios Loll, MD      . umeclidinium-vilanterol [facility] ELLIPTA) 62.5-25 MCG/INH 1 puff  1 puff Inhalation Daily Demetrios Loll, MD   1 puff at Sep 11, 2018 0940   Facility-Administered Medications Ordered in Other Encounters  Medication Dose Route Frequency Provider Last Rate Last Dose  . sodium chloride flush (NS) 0.9 % injection 10 mL  10 mL Intravenous PRN Lloyd Huger, MD   10 mL at 01/26/18 1515    OBJECTIVE: Vitals:   09/11/2018 1300 09-11-2018 1400  BP: 116/70 110/69  Pulse: (!) 108 (!) 105  Resp: (!) 23 19  Temp:    SpO2: 94% 93%     Body mass index is 17.99 kg/m.    ECOG FS:2 - Symptomatic, <50% confined to bed  General: Thin, no acute distress. Eyes: Pink conjunctiva, anicteric sclera. HEENT: Normocephalic, moist mucous membranes, clear oropharnyx. Lungs: Clear to auscultation bilaterally. Heart: Regular rate and rhythm. No rubs,  murmurs, or gallops. Abdomen: Soft, nontender, nondistended. No organomegaly noted, normoactive bowel sounds. Musculoskeletal: No edema, cyanosis, or clubbing. Neuro: Alert, answering all questions appropriately. Cranial nerves grossly intact. Skin: No rashes or petechiae noted. Psych: Normal affect.   LAB RESULTS:  Lab Results  Component Value Date   NA 140 09/11/18   K 4.4 09-11-18   CL 117 (H) 2018-09-11   CO2 16 (L) 11-Sep-2018   GLUCOSE 119 (H) 2018/09/11   BUN 51 (H) 09/11/2018   CREATININE 2.01 (H) 09/11/2018   CALCIUM 9.0 11-Sep-2018   PROT 7.6 08/26/2018   ALBUMIN 4.5 08/15/2018   AST 38 09/02/2018  ALT 17 09/02/2018   ALKPHOS 73 08/21/2018   BILITOT 3.9 (H) 08/23/2018   GFRNONAA 32 (L) 2018/09/12   GFRAA 38 (L) 09/12/18    Lab Results  Component Value Date   WBC 19.7 (H) 2018/09/12   NEUTROABS 14.8 (H) 08/23/2018   HGB 7.3 (L) 12-Sep-2018   HCT 20.3 (L) 09-12-18   MCV 96.7 09/12/2018   PLT 227 09-12-2018     STUDIES: Ct Head Wo Contrast  Result Date: 08/23/2018 CLINICAL DATA:  73 y/o  M; dizziness and weakness. EXAM: CT HEAD WITHOUT CONTRAST TECHNIQUE: Contiguous axial images were obtained from the base of the skull through the vertex without intravenous contrast. COMPARISON:  08/07/2018 CT head. FINDINGS: Brain: No evidence of acute infarction, hemorrhage, hydrocephalus, extra-axial collection or mass lesion/mass effect. Small hypodensities bilaterally within subinsular white matter probably represents chronic lacunar infarctions. Few nonspecific hypodensities in white matter compatible with mild chronic microvascular ischemic changes for age. Mild diffuse volume loss of the brain. Vascular: Calcific atherosclerosis of carotid siphons. No hyperdense vessel. Skull: Normal. Negative for fracture or focal lesion. Sinuses/Orbits: No acute finding. Other: None. IMPRESSION: 1. No acute intracranial abnormality identified. 2. Mild chronic microvascular ischemic  changes and mild volume loss of the brain for age. Electronically Signed   By: Kristine Garbe M.D.   On: 08/23/2018 00:00   Ct Soft Tissue Neck W Contrast  Result Date: 08/07/2018 CLINICAL DATA:  73 y/o M; follow-up of chronic lymphocytic leukemia. History of left-sided renal cell carcinoma. EXAM: CT NECK WITH CONTRAST TECHNIQUE: Multidetector CT imaging of the neck was performed using the standard protocol following the bolus administration of intravenous contrast. CONTRAST:  21mL OMNIPAQUE IOHEXOL 300 MG/ML  SOLN COMPARISON:  05/08/2018, 02/17/2017, 03/05/2016 CT of the neck. Concurrent CT of chest, abdomen, and pelvis. FINDINGS: Pharynx and larynx: Normal. No mass or swelling. Salivary glands: No inflammation, mass, or stone. Thyroid: Normal. Lymph nodes: Diffusely decreased cervical lymphadenopathy with reference nodes as follows: 1. Right supraclavicular, 6 mm short axis, previously 7 mm, series 14, image 67. 2. Left level 2B, 5 mm short axis, previously 9 mm, series 14, image 43. 3. Left level 3, 5 mm short axis, previously 9 mm, series 14, image 52. Vascular: Mixed plaque of the left carotid bifurcation with moderate proximal ICA stenosis. Limited intracranial: Negative. Visualized orbits: Negative. Mastoids and visualized paranasal sinuses: Clear. Skeleton: Stable cervical spondylosis with advanced discogenic degenerative changes at the C3-C7 level or uncovertebral and facet hypertrophy contributes to bony foraminal stenosis bilaterally. No high-grade bony spinal canal stenosis. Upper chest: Please refer to concurrent CT of chest for evaluation of axilla, lungs, mediastinum. Other: None. IMPRESSION: Decreased cervical lymphadenopathy. Please refer to concurrent CT of chest for evaluation axillary and mediastinal adenopathy. Electronically Signed   By: Kristine Garbe M.D.   On: 08/07/2018 14:25   Ct Chest W Contrast  Result Date: 08/07/2018 CLINICAL DATA:  Chronic lymphocytic  leukemia. LEFT-sided renal cell carcinoma. EXAM: CT CHEST, ABDOMEN, AND PELVIS WITH CONTRAST TECHNIQUE: Multidetector CT imaging of the chest, abdomen and pelvis was performed following the standard protocol during bolus administration of intravenous contrast. CONTRAST:  32mL OMNIPAQUE IOHEXOL 300 MG/ML  SOLN COMPARISON:  05/08/2018 FINDINGS: CT CHEST FINDINGS Cardiovascular: Port in the anterior chest wall with tip in distal SVC. Mediastinum/Nodes: Interval decrease in size of axial lymph nodes. For example 10 mm short axis lymph node in the RIGHT axilla decreased from 23 mm. Similar findings LEFT axilla. No pathologically enlarged mediastinal nodes. Hilar lymph nodes  are decreased in size from prior. These are no longer pathologically enlarged where previously measured 2 cm. No pericardial effusion. Lungs/Pleura: No suspicious pulmonary nodules. Musculoskeletal: No aggressive osseous lesion. CT ABDOMEN AND PELVIS FINDINGS Hepatobiliary: No focal hepatic lesion.  Gallbladder normal. Pancreas: Pancreas is normal. No ductal dilatation. No pancreatic inflammation. Spleen: Normal spleen volume. Adrenals/urinary tract: Adrenal glands are normal. Low-density lesion extending from the lower pole of the LEFT kidney measuring 11 mm (image 37/2 can not be fully characterize but is unchanged in size from comparison exam. The ureters and bladder normal. Stomach/Bowel: Stomach, small bowel, appendix, and cecum are normal. The colon and rectosigmoid colon are normal. Vascular/Lymphatic: Abdominal aorta normal caliber. Interval reduction in size of LEFT periaortic lymph nodes. For example 10 mm short axis lymph node on image 77/2 decreased from 30 mm. Significant decrease in iliac adenopathy. The largest remaining lymph node is a RIGHT external iliac lymph node measuring 20 mm short axis decreased from 46 mm. Reproductive: Prostate normal Other: No free fluid. Musculoskeletal: No aggressive osseous lesion. IMPRESSION: Chest  Impression: 1. Interval decrease in size of axillary and hilar lymphadenopathy. Resolution of mediastinal adenopathy. 2. No evidence of disease progression. 3. Lungs are clear. Abdomen / Pelvis Impression: 1. Interval marked decrease in retroperitoneal and iliac adenopathy. Enlarged RIGHT external iliac lymph nodes remain (but decreased as above). 2. Normal volume spleen. 3. No evidence disease progression. 4. Stable small indeterminate lesion LEFT kidney. Electronically Signed   By: Suzy Bouchard M.D.   On: 08/07/2018 15:51   Ct Abdomen Pelvis W Contrast  Result Date: 08/07/2018 CLINICAL DATA:  Chronic lymphocytic leukemia. LEFT-sided renal cell carcinoma. EXAM: CT CHEST, ABDOMEN, AND PELVIS WITH CONTRAST TECHNIQUE: Multidetector CT imaging of the chest, abdomen and pelvis was performed following the standard protocol during bolus administration of intravenous contrast. CONTRAST:  59mL OMNIPAQUE IOHEXOL 300 MG/ML  SOLN COMPARISON:  05/08/2018 FINDINGS: CT CHEST FINDINGS Cardiovascular: Port in the anterior chest wall with tip in distal SVC. Mediastinum/Nodes: Interval decrease in size of axial lymph nodes. For example 10 mm short axis lymph node in the RIGHT axilla decreased from 23 mm. Similar findings LEFT axilla. No pathologically enlarged mediastinal nodes. Hilar lymph nodes are decreased in size from prior. These are no longer pathologically enlarged where previously measured 2 cm. No pericardial effusion. Lungs/Pleura: No suspicious pulmonary nodules. Musculoskeletal: No aggressive osseous lesion. CT ABDOMEN AND PELVIS FINDINGS Hepatobiliary: No focal hepatic lesion.  Gallbladder normal. Pancreas: Pancreas is normal. No ductal dilatation. No pancreatic inflammation. Spleen: Normal spleen volume. Adrenals/urinary tract: Adrenal glands are normal. Low-density lesion extending from the lower pole of the LEFT kidney measuring 11 mm (image 37/2 can not be fully characterize but is unchanged in size from  comparison exam. The ureters and bladder normal. Stomach/Bowel: Stomach, small bowel, appendix, and cecum are normal. The colon and rectosigmoid colon are normal. Vascular/Lymphatic: Abdominal aorta normal caliber. Interval reduction in size of LEFT periaortic lymph nodes. For example 10 mm short axis lymph node on image 77/2 decreased from 30 mm. Significant decrease in iliac adenopathy. The largest remaining lymph node is a RIGHT external iliac lymph node measuring 20 mm short axis decreased from 46 mm. Reproductive: Prostate normal Other: No free fluid. Musculoskeletal: No aggressive osseous lesion. IMPRESSION: Chest Impression: 1. Interval decrease in size of axillary and hilar lymphadenopathy. Resolution of mediastinal adenopathy. 2. No evidence of disease progression. 3. Lungs are clear. Abdomen / Pelvis Impression: 1. Interval marked decrease in retroperitoneal and iliac adenopathy. Enlarged  RIGHT external iliac lymph nodes remain (but decreased as above). 2. Normal volume spleen. 3. No evidence disease progression. 4. Stable small indeterminate lesion LEFT kidney. Electronically Signed   By: Suzy Bouchard M.D.   On: 08/07/2018 15:51   US Venous Img Lower Unilateral Right  Result Date: 29-Aug-2018 CLINICAL DATA:  Right lower extremity pain and edema for the past 2 weeks. History of leukemia. Evaluate for DVT. EXAM: RIGHT LOWER EXTREMITY VENOUS DOPPLER ULTRASOUND TECHNIQUE: Gray-scale sonography with graded compression, as well as color Doppler and duplex ultrasound were performed to evaluate the lower extremity deep venous systems from the level of the common femoral vein and including the common femoral, femoral, profunda femoral, popliteal and calf veins including the posterior tibial, peroneal and gastrocnemius veins when visible. The superficial great saphenous vein was also interrogated. Spectral Doppler was utilized to evaluate flow at rest and with distal augmentation maneuvers in the common  femoral, femoral and popliteal veins. COMPARISON:  None. FINDINGS: Contralateral Common Femoral Vein: Respiratory phasicity is normal and symmetric with the symptomatic side. No evidence of thrombus. Normal compressibility. Common Femoral Vein: No evidence of thrombus. Normal compressibility, respiratory phasicity and response to augmentation. Saphenofemoral Junction: No evidence of thrombus. Normal compressibility and flow on color Doppler imaging. Profunda Femoral Vein: No evidence of thrombus. Normal compressibility and flow on color Doppler imaging. Femoral Vein: The proximal and mid aspects of the right femoral vein appear widely patent (images 17 and 20). There is mixed echogenic near occlusive thrombus within the distal aspect the right femoral vein (image 22 and 23. Popliteal Vein: There is mixed echogenic near occlusive thrombus within the right popliteal vein (images 25 through 29). Calf Veins: There is hypoechoic occlusive thrombus within both paired right posterior tibial veins (image 31 through 33 as well as both paired right peroneal veins (images 34 and 35) Superficial Great Saphenous Vein: No evidence of thrombus. Normal compressibility. Other Findings:  None. IMPRESSION: Age-indeterminate near occlusive DVT involving the distal aspect the right femoral vein, extending through the right popliteal vein to involve both paired right posterior tibial and peroneal veins. Mixed echogenicity of the thrombus could suggest a chronic etiology, though in the absence of prior examinations, an acute on chronic process is not excluded. Clinical correlation is advised. Electronically Signed   By: Sandi Mariscal M.D.   On: 08/29/18 10:37   Dg Chest Port 1 View  Result Date: 2018-08-29 CLINICAL DATA:  Acute respiratory failure EXAM: PORTABLE CHEST 1 VIEW COMPARISON:  Chest CT 08/07/2018 FINDINGS: Porta catheter on the right with tip at the SVC. There is no edema, consolidation, effusion, or pneumothorax. Normal  heart size and mediastinal contours. IMPRESSION: No evidence of active disease. Electronically Signed   By: Monte Fantasia M.D.   On: 08/29/18 07:32    ASSESSMENT: Hemolytic anemia, CLL.  PLAN:    1.  CLL: Flow cytometry confirmed the diagnosis. Patient is also ZAP-70 positive this which indicates it may be a more aggressive form of CLL although FISH panel revealed 13q- which tends to have the best prognosis.  CT scan results from August 07, 2018 reviewed independently and reported as above with significant improvement of his known lymphadenopathy.  Patient last received chemotherapy approximately 5 weeks ago with Rituxan and Treanda.  No further treatments are planned at this time. 2.  Hemolytic anemia: Patient with significantly elevated LDH and indirect bilirubin.  Haptoglobin and DAT are pending at time of dictation.  No schistocytes seen on peripheral smear.  Patient  received 1 dose of IV Solu-Medrol yesterday, will continue dosing daily.  Check daily CBC and will recheck hemolysis labs in the next 2 to 3 days. 3.  Anxiety: Continue Xanax as needed. 4.  Leukocytosis: Likely multifactorial, monitor. 5.  DVT: Right lower extremity ultrasounds results noted.  Agree with vascular surgery consult for IVC filter until hemolytic anemia resolves.  Appreciate consult, will follow.  Lloyd Huger, MD   August 25, 2018 3:44 PM

## 2018-09-13 NOTE — ED Provider Notes (Signed)
[facility]  Department of Emergency Medicine   Code Blue CONSULT NOTE  Chief Complaint: Cardiac arrest/unresponsive   Level V Caveat: Unresponsive  History of present illness: I was contacted by the hospital for a CODE BLUE cardiac arrest upstairs and presented to the patient's bedside.  Patient had been into the admitted to the hospital 2 days ago because of concerns for anemia.  Apparently just prior to the [place] being called he was having a slight panic attack.  The nurse gave him an Ativan and then very shortly after he became less responsive.  Initial was PEA.  At the time of my arrival he did have a pulse and was breathing spontaneously.  ROS: Unable to obtain, Level V caveat  Scheduled Meds: . budesonide (PULMICORT) nebulizer solution  0.5 mg Nebulization BID  . dronabinol  2.5 mg Oral BID AC  . etomidate      . feeding supplement (ENSURE ENLIVE)  237 mL Oral TID BM  . ipratropium  2 spray Each Nare Q12H  . methylPREDNISolone (SOLU-MEDROL) injection  60 mg Intravenous Daily  . pantoprazole  40 mg Oral Daily  . rocuronium      . sodium chloride flush  3 mL Intravenous Q12H  . succinylcholine      . umeclidinium-vilanterol  1 puff Inhalation Daily   Continuous Infusions: . sodium chloride    . norepinephrine (LEVOPHED) Adult infusion     PRN Meds:.sodium chloride, acetaminophen **OR** acetaminophen, albuterol, bisacodyl, diphenhydrAMINE, HYDROcodone-acetaminophen, LORazepam, ondansetron **OR** ondansetron (ZOFRAN) IV, senna-docusate, sodium chloride flush, sodium chloride flush Past Medical History:  Diagnosis Date  . Anxiety   . CLL (chronic lymphocytic leukemia) ([place])   . COPD (chronic obstructive pulmonary disease) ([place])   . Hypertension    Past Surgical History:  Procedure Laterality Date  . HERNIA REPAIR    . PERIPHERAL VASCULAR CATHETERIZATION N/A 03/14/2016   Procedure: Glori Luis Cath Insertion;  Surgeon: Algernon Huxley, MD;  Location: [facility]  [facility];  Service: Cardiovascular;  Laterality: N/A;  . ROBOTIC ASSITED PARTIAL NEPHRECTOMY Left 11/14/2015   Procedure: ROBOTIC ASSITED PARTIAL NEPHRECTOMY with intraoperative drop in ultrasound / Extensive Lysis of adhesions;  Surgeon: [name], MD;  Location: [facility];  Service: Urology;  Laterality: Left;   Social History   Socioeconomic History  . Marital status: Married    Spouse name: Not on file  . Number of children: Not on file  . Years of education: Not on file  . Highest education level: 9th grade  Occupational History  . Not on file  Social Needs  . Financial resource strain: Not hard at all  . Food insecurity:    Worry: Never true    Inability: Never true  . Transportation needs:    Medical: No    Non-medical: No  Tobacco Use  . Smoking status: Current Every Day Smoker    Packs/day: 1.00    Years: 40.00    Pack years: 40.00    Types: Cigarettes  . Smokeless tobacco: Never Used  Substance and Sexual Activity  . Alcohol use: Yes    Alcohol/week: 7.0 standard drinks    Types: 7 Cans of beer per week  . Drug use: No  . Sexual activity: Not on file  Lifestyle  . Physical activity:    Days per week: 0 days    Minutes per session: 0 min  . Stress: Not at all  Relationships  . Social connections:    Talks on phone:  More than three times a week    Gets together: More than three times a week    Attends religious service: More than 4 times per year    Active member of club or organization: No    Attends meetings of clubs or organizations: Never    Relationship status: Married  . Intimate partner violence:    Fear of current or ex partner: No    Emotionally abused: No    Physically abused: No    Forced sexual activity: No  Other Topics Concern  . Not on file  Social History Narrative   Works 32 hours a week    Allergies  Allergen Reactions  . Aspirin Other (See Comments)    Ulcer   . Hydrochlorothiazide Itching    Last set of Vital Signs (not  current) Vitals:   09-05-2018 1300 2018/09/05 1400  BP: 116/70 110/69  Pulse: (!) 108 (!) 105  Resp: (!) 23 19  Temp:    SpO2: 94% 93%      Physical Exam  Gen: unresponsive Cardiovascular: palpable pulse Resp: spontaneous breaths, BVM Abd: nondistended  Neuro: GCS 3, unresponsive to pain  HEENT: No blood in posterior pharynx, gag reflex absent  Neck: No crepitus  Musculoskeletal: No deformity  Skin: warm  Procedures  INTUBATION Performed by: Nance Pear Required items: required blood products, implants, devices, and special equipment available Patient identity confirmed: provided demographic data and hospital-assigned identification number Time out: Immediately prior to procedure a "time out" was called to verify the correct patient, procedure, equipment, support staff and site/side marked as required. Indications: cardiopulmonary arrest Intubation method: glidescope Preoxygenation: BVM Sedatives: etomidate Paralytic: rocuronium  Tube Size: 7.5 cuffed Post-procedure assessment: chest rise and ETCO2 monitor Breath sounds: equal and absent over the epigastrium Tube secured by Respiratory Therapy Patient tolerated the procedure well with no immediate complications.  CRITICAL CARE Performed by: Nance Pear Total critical care time: 20 Critical care time was exclusive of separately billable procedures and treating other patients. Critical care was necessary to treat or prevent imminent or life-threatening deterioration. Critical care was time spent personally by me on the following activities: development of treatment plan with patient and/or surrogate as well as nursing, discussions with consultants, evaluation of patient's response to treatment, examination of patient, obtaining history from patient or surrogate, ordering and performing treatments and interventions, ordering and review of laboratory studies, ordering and review of radiographic studies, pulse oximetry and  re-evaluation of patient's condition.  Cardiopulmonary Resuscitation (CPR) Procedure Note  Directed/Performed by: Nance Pear I personally directed ancillary staff and/or performed CPR in an effort to regain return of spontaneous circulation and to maintain cardiac, neuro and systemic perfusion.    Medical Decision making  Presented to patient's bedside after CODE BLUE was called.  Upon initial arrival patient did have spontaneous breathing and increased.  However shortly after arrival his breathing decreased and he went bradycardic and lost pulses.  1 round of CPR was performed patient was given etomidate and atropine.  The decision was made to intubate which was performed with glide scope.  Patient continued to have good pulses during and after intubation.  Assessment and Plan  Cardiopulmonary arrest.  Status post intubation.    Nance Pear, MD 2018/09/05 [phone]

## 2018-09-13 NOTE — Code Documentation (Signed)
PATIENT NAME: Julian Medina MEDICAL RECORD NUMBER: [id number] Birthday: May 01, 1946  Age: 73 y.o. Admit Date: 08/18/2018  Provider: Renold Don, MD  Indication: Cardiac arrest  Technical Description:   CPR performance duration: 17 minutes (this was second event)  Was defibrillation or cardioversion used ?No  Was external pacer placed ? Yes  Was patient intubated pre/post CPR ? Yes (during prior event)  Was transvenous pacer placed ? No  Medications Administered Include      Yes/no Amiodarone   Atropine        Y  Calcium   Epinephrine        Y  Lidocaine   Magnesium   Norepinephrine/Dopamine         Y  Phenylephrine   Sodium bicarbonate         Y  Vasopressin      Evaluation  Final Status - Was patient successfully resuscitated ? Y (but tenuous, family made him DNR post code)  If successfully resuscitated - what is current rhythm ? Sinus tach If successfully resuscitated - what is current hemodynamic status ? Unstable despite 2 pressors. Patient subsequently made DNR/Comfort measures by family.  Miscellaneous Information Patient had prior CODE BLUE event at [phone]. ED Physician had intubated patient.       Julian Alberta GonzalezMar 03, 20206:15 PM     Renold Don, M.D.  [facility]

## 2018-09-13 NOTE — Progress Notes (Signed)
At approximately 1600, pt became very anxious. Pt was complaining of feeling hot and writhing back and forth in the bed. Attempted to cool pt with ice packs and cold washcloths without success. Dr. Patsey Berthold was contacted for orders for antianxiety medication. Verbal orders were given for PO ativan. Approximately 2-3 minutes after giving the PO ativan, pt went into cardiac arrest. Code Blue was called and CPR started. See code sheet for further. After achieving ROSC pt's wife stated that she did not wish for [place] to provide any further resuscitative efforts. Wife ultimately decided that she wanted pt to placed on comfort care measures. Time of death 84. Dr. Patsey Berthold notified of same.

## 2018-09-13 NOTE — Progress Notes (Signed)
   13-Sep-2018 1900  Clinical Encounter Type  Visited With Family  Visit Type Follow-up;Spiritual support  Spiritual Encounters  Spiritual Needs Emotional   Chaplain returned to be with the family following extubation. The family pastor and many of the patient's brothers had arrived, along with a host of other family members and friends by this time. Family took turns with the patient at bedside offering words of comfort to one another. They also offered words of comfort, peace and encouragement while sharing a review of patient's life. The family appears to offer good support to one another. Chaplain left the family to the care of the patient's pastor.They will request a call or page to the chaplain if needed. Will follow up.

## 2018-09-13 NOTE — Progress Notes (Signed)
RT's responded to Code Blue, CPR being performed, Pt bagged and intubated by ER MD. Pt placed on vent after last CPR attempt and wife stating no further attempts.

## 2018-09-13 DEATH — deceased

## 2019-03-18 ENCOUNTER — Ambulatory Visit: Payer: Medicare Other

## 2019-06-07 ENCOUNTER — Ambulatory Visit: Payer: Medicare Other | Admitting: Family Medicine
